# Patient Record
Sex: Female | Born: 1935 | Race: White | Hispanic: No | Smoking: Never smoker
Health system: Southern US, Community
[De-identification: ages and names within clinical notes are randomized; demographics above are authoritative.]

## PROBLEM LIST (undated history)

## (undated) DIAGNOSIS — J449 Chronic obstructive pulmonary disease, unspecified: Secondary | ICD-10-CM

## (undated) DIAGNOSIS — M810 Age-related osteoporosis without current pathological fracture: Secondary | ICD-10-CM

## (undated) DIAGNOSIS — Z974 Presence of external hearing-aid: Secondary | ICD-10-CM

## (undated) DIAGNOSIS — R42 Dizziness and giddiness: Secondary | ICD-10-CM

## (undated) DIAGNOSIS — I4891 Unspecified atrial fibrillation: Secondary | ICD-10-CM

## (undated) DIAGNOSIS — I499 Cardiac arrhythmia, unspecified: Secondary | ICD-10-CM

## (undated) DIAGNOSIS — I10 Essential (primary) hypertension: Secondary | ICD-10-CM

## (undated) DIAGNOSIS — Z86010 Personal history of colonic polyps: Secondary | ICD-10-CM

## (undated) DIAGNOSIS — R5383 Other fatigue: Secondary | ICD-10-CM

## (undated) DIAGNOSIS — Z973 Presence of spectacles and contact lenses: Secondary | ICD-10-CM

## (undated) DIAGNOSIS — E039 Hypothyroidism, unspecified: Secondary | ICD-10-CM

## (undated) DIAGNOSIS — R5381 Other malaise: Secondary | ICD-10-CM

## (undated) DIAGNOSIS — E785 Hyperlipidemia, unspecified: Secondary | ICD-10-CM

## (undated) HISTORY — DX: Age-related osteoporosis without current pathological fracture: M81.0

## (undated) HISTORY — PX: ABDOMINAL HYSTERECTOMY: SHX81

## (undated) HISTORY — DX: Hypothyroidism, unspecified: E03.9

## (undated) HISTORY — DX: Cardiac arrhythmia, unspecified: I49.9

## (undated) HISTORY — DX: Other fatigue: R53.83

## (undated) HISTORY — PX: YAG LASER APPLICATION: SHX6189

## (undated) HISTORY — PX: CHOLECYSTECTOMY: SHX55

## (undated) HISTORY — PX: BREAST SURGERY: SHX581

## (undated) HISTORY — DX: Personal history of colonic polyps: Z86.010

## (undated) HISTORY — DX: Other malaise: R53.81

## (undated) HISTORY — PX: TONSILLECTOMY: SHX5217

## (undated) HISTORY — PX: APPENDECTOMY: SHX54

## (undated) HISTORY — DX: Hyperlipidemia, unspecified: E78.5

## (undated) HISTORY — DX: Essential (primary) hypertension: I10

## (undated) HISTORY — PX: CATARACT EXTRACTION: SUR2

## (undated) HISTORY — DX: Dizziness and giddiness: R42

## (undated) HISTORY — DX: Chronic obstructive pulmonary disease, unspecified: J44.9

---

## 1998-10-22 ENCOUNTER — Emergency Department (HOSPITAL_COMMUNITY): Admission: EM | Admit: 1998-10-22 | Discharge: 1998-10-22 | Payer: Self-pay | Admitting: Emergency Medicine

## 1999-09-03 ENCOUNTER — Other Ambulatory Visit: Admission: RE | Admit: 1999-09-03 | Discharge: 1999-09-03 | Payer: Self-pay | Admitting: Obstetrics & Gynecology

## 1999-10-07 ENCOUNTER — Encounter: Payer: Self-pay | Admitting: Obstetrics & Gynecology

## 1999-10-11 ENCOUNTER — Inpatient Hospital Stay (HOSPITAL_COMMUNITY): Admission: RE | Admit: 1999-10-11 | Discharge: 1999-10-14 | Payer: Self-pay | Admitting: Obstetrics & Gynecology

## 1999-10-11 ENCOUNTER — Encounter (INDEPENDENT_AMBULATORY_CARE_PROVIDER_SITE_OTHER): Payer: Self-pay | Admitting: Specialist

## 2004-02-14 ENCOUNTER — Ambulatory Visit: Payer: Self-pay | Admitting: Internal Medicine

## 2004-05-21 ENCOUNTER — Ambulatory Visit: Payer: Self-pay | Admitting: Internal Medicine

## 2004-08-22 ENCOUNTER — Ambulatory Visit: Payer: Self-pay | Admitting: Internal Medicine

## 2004-11-20 ENCOUNTER — Ambulatory Visit: Payer: Self-pay | Admitting: Internal Medicine

## 2005-03-18 ENCOUNTER — Ambulatory Visit: Payer: Self-pay | Admitting: Internal Medicine

## 2005-07-22 ENCOUNTER — Ambulatory Visit: Payer: Self-pay | Admitting: Internal Medicine

## 2005-11-20 ENCOUNTER — Ambulatory Visit: Payer: Self-pay | Admitting: Internal Medicine

## 2006-10-09 DIAGNOSIS — M810 Age-related osteoporosis without current pathological fracture: Secondary | ICD-10-CM

## 2006-10-09 DIAGNOSIS — I10 Essential (primary) hypertension: Secondary | ICD-10-CM

## 2006-10-09 DIAGNOSIS — Z860100 Personal history of colon polyps, unspecified: Secondary | ICD-10-CM | POA: Insufficient documentation

## 2006-10-09 DIAGNOSIS — E039 Hypothyroidism, unspecified: Secondary | ICD-10-CM

## 2006-10-09 DIAGNOSIS — E785 Hyperlipidemia, unspecified: Secondary | ICD-10-CM

## 2006-10-09 HISTORY — DX: Hypothyroidism, unspecified: E03.9

## 2006-10-09 HISTORY — DX: Age-related osteoporosis without current pathological fracture: M81.0

## 2006-10-09 HISTORY — DX: Personal history of colonic polyps: Z86.010

## 2006-10-09 HISTORY — DX: Hyperlipidemia, unspecified: E78.5

## 2006-10-09 HISTORY — DX: Essential (primary) hypertension: I10

## 2006-10-09 HISTORY — DX: Personal history of colon polyps, unspecified: Z86.0100

## 2008-07-31 ENCOUNTER — Encounter: Payer: Self-pay | Admitting: Internal Medicine

## 2009-08-06 ENCOUNTER — Emergency Department (HOSPITAL_COMMUNITY): Admission: EM | Admit: 2009-08-06 | Discharge: 2009-08-06 | Payer: Self-pay | Admitting: Emergency Medicine

## 2009-08-07 ENCOUNTER — Ambulatory Visit: Payer: Self-pay | Admitting: Internal Medicine

## 2009-08-07 DIAGNOSIS — R42 Dizziness and giddiness: Secondary | ICD-10-CM | POA: Insufficient documentation

## 2009-08-07 HISTORY — DX: Dizziness and giddiness: R42

## 2009-08-07 LAB — CONVERTED CEMR LAB
ALT: 17 units/L (ref 0–35)
AST: 23 units/L (ref 0–37)
Albumin: 4 g/dL (ref 3.5–5.2)
Alkaline Phosphatase: 75 units/L (ref 39–117)
Bilirubin, Direct: 0.1 mg/dL (ref 0.0–0.3)
Cholesterol: 192 mg/dL (ref 0–200)
TSH: 1.24 microintl units/mL (ref 0.35–5.50)
Total Bilirubin: 0.5 mg/dL (ref 0.3–1.2)
Total Protein: 6.7 g/dL (ref 6.0–8.3)

## 2009-08-14 ENCOUNTER — Telehealth: Payer: Self-pay | Admitting: Internal Medicine

## 2009-08-15 ENCOUNTER — Ambulatory Visit: Payer: Self-pay | Admitting: Internal Medicine

## 2009-08-15 DIAGNOSIS — R5381 Other malaise: Secondary | ICD-10-CM

## 2009-08-15 DIAGNOSIS — R5383 Other fatigue: Secondary | ICD-10-CM

## 2009-08-15 HISTORY — DX: Other fatigue: R53.81

## 2009-08-16 ENCOUNTER — Telehealth: Payer: Self-pay | Admitting: Internal Medicine

## 2009-08-20 ENCOUNTER — Telehealth: Payer: Self-pay | Admitting: Internal Medicine

## 2009-09-12 ENCOUNTER — Telehealth: Payer: Self-pay | Admitting: Internal Medicine

## 2009-09-12 ENCOUNTER — Ambulatory Visit: Payer: Self-pay | Admitting: Internal Medicine

## 2009-09-13 ENCOUNTER — Telehealth: Payer: Self-pay | Admitting: Internal Medicine

## 2009-10-10 ENCOUNTER — Encounter: Payer: Self-pay | Admitting: Internal Medicine

## 2009-12-25 ENCOUNTER — Encounter: Payer: Self-pay | Admitting: Internal Medicine

## 2010-01-03 ENCOUNTER — Encounter: Payer: Self-pay | Admitting: Internal Medicine

## 2010-01-08 ENCOUNTER — Encounter: Payer: Self-pay | Admitting: Internal Medicine

## 2010-01-08 ENCOUNTER — Ambulatory Visit: Payer: Self-pay | Admitting: Internal Medicine

## 2010-01-08 DIAGNOSIS — I499 Cardiac arrhythmia, unspecified: Secondary | ICD-10-CM | POA: Insufficient documentation

## 2010-01-08 HISTORY — DX: Cardiac arrhythmia, unspecified: I49.9

## 2010-01-31 ENCOUNTER — Telehealth: Payer: Self-pay | Admitting: Internal Medicine

## 2010-04-16 NOTE — Letter (Signed)
Summary: [facility]   Imported By: Maryln Gottron 01/09/2010 09:18:49  _____________________________________________________________________  External Attachment:    Type:   Image     Comment:   External Document

## 2010-04-16 NOTE — Procedures (Signed)
Summary: Cardiac Event Monitor Report/[facility]  Cardiac Event Monitor Report/[facility]   Imported By: Maryln Gottron 01/09/2010 09:16:43  _____________________________________________________________________  External Attachment:    Type:   Image     Comment:   External Document

## 2010-04-16 NOTE — Miscellaneous (Signed)
Summary: med update  Clinical Lists Changes  Medications: Changed medication from SYNTHROID 75 MCG TABS (LEVOTHYROXINE SODIUM) to SYNTHROID 75 MCG TABS (LEVOTHYROXINE SODIUM) qd

## 2010-04-16 NOTE — Assessment & Plan Note (Signed)
Summary: 6 MONTH ROV/NJR   Vital Signs:  Patient profile:   77 year old female Height:      60.25 inches Weight:      144 pounds BMI:     27.99 Temp:     98.5 degrees F oral Pulse rate:   72 / minute Resp:     14 per minute BP sitting:   130 / 80  (left arm)  Vitals Entered By: Willy Eddy, LPN (January 08, 2010 10:10 AM) CC: roa Is Patient Diabetic? No   CC:  roa.  History of Present Illness: 8 -year-old patient who is seen today for followup.  She has a history of hypertension, dyslipidemia, hypothyroidism.  She is doing well clinically.  In July.  She had cardiology evaluation in [facility] due to palpitations and dizziness.  This apparently included a 3-week event monitor that was unremarkable.  Today she has done well. her blood pressure has a well-controlled on losartan  and diuretic therapy.  She remains on Lipitor, which she continues to tolerate well  Preventive Screening-Counseling & Management  Alcohol-Tobacco     Smoking Status: never  Current Problems (verified): 1)  Lassitude  (ICD-780.79) 2)  Vertigo  (ICD-780.4) 3)  Osteoporosis  (ICD-733.00) 4)  Hypothyroidism  (ICD-244.9) 5)  Hyperlipidemia  (ICD-272.4) 6)  Colonic Polyps, Hx of  (ICD-V12.72) 7)  Hypertension  (ICD-401.9)  Current Medications (verified): 1)  Synthroid 75 Mcg Tabs (Levothyroxine Sodium) .... Qd 2)  Lipitor 20 Mg Tabs (Atorvastatin Calcium) .... Qd 3)  Zolpidem Tartrate 10 Mg Tabs (Zolpidem Tartrate) .... At Bedtime Prn 4)  Meclizine Hcl 25 Mg Tabs (Meclizine Hcl) .... Q6hrs As Needed Dizziness 5)  Losartan Potassium-Hctz 100-12.5 Mg Tabs (Losartan Potassium-Hctz) .... One Daily 6)  Transderm-Scop 1.5 Mg Pt72 (Scopolamine Base) .... Place 1 Patch Behind Ear Q72hrs As Needed For Motion Sickness  Allergies (verified): 1)  ! Codeine  Past History:  Past Medical History: Reviewed history from 10/09/2006 and no changes required. Hypertension Hypercholesterolemia Colonic  polyps, hx of Hyperlipidemia Hypothyroidism Osteoporosis  Past Surgical History: Reviewed history from 08/07/2009 and no changes required. Appendectomy Cholecystectomy Hysterectomy Lumpectomy-left Tonsillectomy cataracts 5-10  Review of Systems  The patient denies anorexia, fever, weight loss, weight gain, vision loss, decreased hearing, hoarseness, chest pain, syncope, dyspnea on exertion, peripheral edema, prolonged cough, headaches, hemoptysis, abdominal pain, melena, hematochezia, severe indigestion/heartburn, hematuria, incontinence, genital sores, muscle weakness, suspicious skin lesions, transient blindness, difficulty walking, depression, unusual weight change, abnormal bleeding, enlarged lymph nodes, angioedema, and breast masses.         Flu Vaccine Consent Questions     Do you have a history of severe allergic reactions to this vaccine? no    Any prior history of allergic reactions to egg and/or gelatin? no    Do you have a sensitivity to the preservative Thimersol? no    Do you have a past history of Guillan-Barre Syndrome? no    Do you currently have an acute febrile illness? no    Have you ever had a severe reaction to latex? no    Vaccine information given and explained to patient? yes    Are you currently pregnant? no    Lot Number:[id number]   Exp Date:09/14/2010   Site Given  Left Deltoid IM   Physical Exam  General:  elderly alert, no distress.  Blood pressure 120/80 Head:  Normocephalic and atraumatic without obvious abnormalities. No apparent alopecia or balding. Eyes:  No corneal or conjunctival inflammation  noted. EOMI. Perrla. Funduscopic exam benign, without hemorrhages, exudates or papilledema. Vision grossly normal. Ears:  External ear exam shows no significant lesions or deformities.  Otoscopic examination reveals clear canals, tympanic membranes are intact bilaterally without bulging, retraction, inflammation or discharge. Hearing is grossly normal  bilaterally. Mouth:  Oral mucosa and oropharynx without lesions or exudates.  Teeth in good repair. Neck:  No deformities, masses, or tenderness noted. Lungs:  Normal respiratory effort, chest expands symmetrically. Lungs are clear to auscultation, no crackles or wheezes. Heart:  irregular with a controlled ventricular response Abdomen:  Bowel sounds positive,abdomen soft and non-tender without masses, organomegaly or hernias noted. Msk:  No deformity or scoliosis noted of thoracic or lumbar spine.   Pulses:  R and L carotid,radial,femoral,dorsalis pedis and posterior tibial pulses are full and equal bilaterally Extremities:  No clubbing, cyanosis, edema, or deformity noted with normal full range of motion of all joints.   Skin:  Intact without suspicious lesions or rashes Cervical Nodes:  No lymphadenopathy noted   Impression & Recommendations:  Problem # 1:  HYPERLIPIDEMIA (ICD-272.4)  Her updated medication list for this problem includes:    Lipitor 20 Mg Tabs (Atorvastatin calcium) ..... Qd  Her updated medication list for this problem includes:    Lipitor 20 Mg Tabs (Atorvastatin calcium) ..... Qd  Problem # 2:  HYPERTENSION (ICD-401.9)  Her updated medication list for this problem includes:    Losartan Potassium-hctz 100-12.5 Mg Tabs (Losartan potassium-hctz) ..... One daily  Her updated medication list for this problem includes:    Losartan Potassium-hctz 100-12.5 Mg Tabs (Losartan potassium-hctz) ..... One daily  Problem # 3:  HYPOTHYROIDISM (ICD-244.9)  Her updated medication list for this problem includes:    Synthroid 75 Mcg Tabs (Levothyroxine sodium) ..... Qd  Her updated medication list for this problem includes:    Synthroid 75 Mcg Tabs (Levothyroxine sodium) ..... Qd  Problem # 4:  CARDIAC ARRHYTHMIA (ICD-427.9) will check a 12-lead EKG to rule out atrial fibrillation  Complete Medication List: 1)  Synthroid 75 Mcg Tabs (Levothyroxine sodium) .... Qd 2)   Lipitor 20 Mg Tabs (Atorvastatin calcium) .... Qd 3)  Zolpidem Tartrate 10 Mg Tabs (Zolpidem tartrate) .... At bedtime prn 4)  Meclizine Hcl 25 Mg Tabs (Meclizine hcl) .... Q6hrs as needed dizziness 5)  Losartan Potassium-hctz 100-12.5 Mg Tabs (Losartan potassium-hctz) .... One daily 6)  Transderm-scop 1.5 Mg Pt72 (Scopolamine base) .... Place 1 patch behind ear q72hrs as needed for motion sickness  Other Orders: Flu Vaccine 86yrs + MEDICARE PATIENTS (H4742) Administration Flu vaccine - MCR (V9563)  Patient Instructions: 1)  Please schedule a follow-up appointment in 6 months. 2)  Limit your Sodium (Salt). 3)  It is important that you exercise regularly at least 20 minutes 5 times a week. If you develop chest pain, have severe difficulty breathing, or feel very tired , stop exercising immediately and seek medical attention. Prescriptions: LOSARTAN POTASSIUM-HCTZ 100-12.5 MG TABS (LOSARTAN POTASSIUM-HCTZ) one daily  #90 x 4   Entered and Authorized by:   Gordy Savers  MD   Signed by:   Gordy Savers  MD on 01/08/2010   Method used:   Electronically to        [facility] (retail)       [address]       [place], [place]  [address]       Ph: [phone]  Fax: [phone]   RxID:   [id number] LIPITOR 20 MG TABS (ATORVASTATIN CALCIUM) qd  #90 x 6   Entered and Authorized by:   Gordy Savers  MD   Signed by:   Gordy Savers  MD on 01/08/2010   Method used:   Electronically to        [facility].* (retail)       [address]       [place], [place]  [address]       Ph: [phone]       Fax: [phone]   RxID:   [id number] SYNTHROID 75 MCG TABS (LEVOTHYROXINE SODIUM) qd  #90 x 4   Entered and Authorized by:   Gordy Savers  MD   Signed by:   Gordy Savers  MD on 01/08/2010   Method used:   Electronically to        [facility] (retail)        [address]       [place], [place]  [address]       Ph: [phone]       Fax: [phone]   RxID:   [id number]    Orders Added: 1)  Flu Vaccine 63yrs + MEDICARE PATIENTS [Q2039] 2)  Administration Flu vaccine - MCR [G0008] 3)  Est. Patient Level IV [25427]  Appended Document: Orders Update    Clinical Lists Changes  Orders: Added new Service order of EKG w/ Interpretation (93000) - Signed

## 2010-04-16 NOTE — Progress Notes (Signed)
Summary: scopolamine patch  Phone Note Call from Patient Call back at Home Phone [phone]   Caller: Patient Call For: Gordy Savers  MD Summary of Call: pt is requesting to talk to kim concerning scopolamine patch pharm [facility] [phone] Initial call taken by: Heron Sabins,  September 13, 2009 10:05 AM  Follow-up for Phone Call        to Dr. Amador Cunas to advise. KIK Follow-up by: Duard Brady LPN,  September 13, 2009 10:10 AM  Additional Follow-up for Phone Call Additional follow up Details #1::        OK  #3  use q72h as needed motion sickness Additional Follow-up by: Gordy Savers  MD,  September 13, 2009 12:55 PM    New/Updated Medications: TRANSDERM-SCOP 1.5 MG PT72 (SCOPOLAMINE BASE) place 1 patch behind ear q72hrs as needed for motion sickness Prescriptions: TRANSDERM-SCOP 1.5 MG PT72 (SCOPOLAMINE BASE) place 1 patch behind ear q72hrs as needed for motion sickness  #3 x 0   Entered by:   Duard Brady LPN   Authorized by:   Gordy Savers  MD   Signed by:   Duard Brady LPN on 62/13/0865   Method used:   Electronically to        [facility].* (retail)       [address]       [place], [place]  [address]       Ph: [phone]       Fax: [phone]   RxID:   [id number]

## 2010-04-16 NOTE — Assessment & Plan Note (Signed)
Summary: 1 month rov/njr   Vital Signs:  Patient profile:   77 year old female Weight:      144 pounds Temp:     98.1 degrees F oral BP sitting:   138 / 82  (right arm) Cuff size:   regular  Vitals Entered By: Duard Brady LPN (September 12, 2009 10:55 AM) CC: 1 mos rov - doing well Is Patient Diabetic? No   CC:  1 mos rov - doing well.  History of Present Illness: an 77 year old patient has a history of episodic vertigo and treated hypertension.  One month ago for beta-blocker therapy was tapered due to chronic fatigue.  She has not noted much of a difference with tapering this medication.  She is only on half her prior dose every other day.  losartan was also added to her regimen  Allergies: 1)  ! Codeine  Past History:  Past Medical History: Reviewed history from 10/09/2006 and no changes required. Hypertension Hypercholesterolemia Colonic polyps, hx of Hyperlipidemia Hypothyroidism Osteoporosis  Review of Systems  The patient denies anorexia, fever, weight loss, weight gain, vision loss, decreased hearing, hoarseness, chest pain, syncope, dyspnea on exertion, peripheral edema, prolonged cough, headaches, hemoptysis, abdominal pain, melena, hematochezia, severe indigestion/heartburn, hematuria, incontinence, genital sores, muscle weakness, suspicious skin lesions, transient blindness, difficulty walking, depression, unusual weight change, abnormal bleeding, enlarged lymph nodes, angioedema, and breast masses.    Physical Exam  General:  overweight-appearing.  overweight-appearing.   Head:  Normocephalic and atraumatic without obvious abnormalities. No apparent alopecia or balding. Mouth:  Oral mucosa and oropharynx without lesions or exudates.  Teeth in good repair. Neck:  No deformities, masses, or tenderness noted. Lungs:  Normal respiratory effort, chest expands symmetrically. Lungs are clear to auscultation, no crackles or wheezes. Heart:  Normal rate and regular  rhythm. S1 and S2 normal without gallop, murmur, click, rub or other extra sounds.   Impression & Recommendations:  Problem # 1:  LASSITUDE (ICD-780.79)  Problem # 2:  HYPERTENSION (ICD-401.9)  The following medications were removed from the medication list:    Losartan Potassium 100 Mg Tabs (Losartan potassium) ..... One daily    Bisoprolol-hydrochlorothiazide 5-6.25 Mg Tabs (Bisoprolol-hydrochlorothiazide) ..... One daily for two weeks, then every other day for two weeks, then discontinue Her updated medication list for this problem includes:    Losartan Potassium-hctz 100-12.5 Mg Tabs (Losartan potassium-hctz) ..... One daily  The following medications were removed from the medication list:    Losartan Potassium 100 Mg Tabs (Losartan potassium) ..... One daily    Bisoprolol-hydrochlorothiazide 5-6.25 Mg Tabs (Bisoprolol-hydrochlorothiazide) ..... One daily for two weeks, then every other day for two weeks, then discontinue Her updated medication list for this problem includes:    Losartan Potassium-hctz 100-12.5 Mg Tabs (Losartan potassium-hctz) ..... One daily  Complete Medication List: 1)  Synthroid 75 Mcg Tabs (Levothyroxine sodium) .... Qd 2)  Lipitor 20 Mg Tabs (Atorvastatin calcium) .... Qd 3)  Zolpidem Tartrate 10 Mg Tabs (Zolpidem tartrate) .... At bedtime prn 4)  Meclizine Hcl 25 Mg Tabs (Meclizine hcl) .... Q6hrs as needed dizziness 5)  Losartan Potassium-hctz 100-12.5 Mg Tabs (Losartan potassium-hctz) .... One daily  Patient Instructions: 1)  Limit your Sodium (Salt). 2)  It is important that you exercise regularly at least 20 minutes 5 times a week. If you develop chest pain, have severe difficulty breathing, or feel very tired , stop exercising immediately and seek medical attention. 3)  Check your Blood Pressure regularly. If it is above: 150/90  you should make an appointment. 4)  Please schedule a follow-up appointment in 4 months. Prescriptions: LOSARTAN  POTASSIUM-HCTZ 100-12.5 MG TABS (LOSARTAN POTASSIUM-HCTZ) one daily  #90 x 4   Entered and Authorized by:   Gordy Savers  MD   Signed by:   Gordy Savers  MD on 09/12/2009   Method used:   Electronically to        [facility].* (retail)       [address]       [place], [place]  [address]       Ph: [phone]       Fax: [phone]   RxID:   [id number]

## 2010-04-16 NOTE — Assessment & Plan Note (Signed)
Summary: FATIGUE / WEAKNESS // SR   Vital Signs:  Patient profile:   77 year old female Weight:      144 pounds Temp:     98.0 degrees F oral BP sitting:   120 / 76  (left arm) Cuff size:   regular  Vitals Entered By: Duard Brady LPN (August 15, 4780 10:28 AM) [facility]: c/o weakness and lower abd pain , f/u on last wks visit Is Patient Diabetic? No   CC:  c/o weakness and lower abd pain  and f/u on last wks visit.  History of Present Illness: 77 -year-old patient who is seen today for follow-up; she was a valuated in the ED  recently for vertigo, and laboratory studies were unremarkable.  Her main complaint is fatigue of about 6 months duration.  Antihypertensive regimen includes beta-blocker therapy.  No focal complaints  Preventive Screening-Counseling & Management  Alcohol-Tobacco     Smoking Status: never  Allergies: 1)  ! Codeine  Physical Exam  General:  Well-developed,well-nourished,in no acute distress; alert,appropriate and cooperative throughout examination; blood pressure low normal range Head:  Normocephalic and atraumatic without obvious abnormalities. No apparent alopecia or balding. Mouth:  Oral mucosa and oropharynx without lesions or exudates.  Teeth in good repair. Neck:  No deformities, masses, or tenderness noted. Lungs:  Normal respiratory effort, chest expands symmetrically. Lungs are clear to auscultation, no crackles or wheezes. Heart:  Normal rate and regular rhythm. S1 and S2 normal without gallop, murmur, click, rub or other extra sounds. Abdomen:  Bowel sounds positive,abdomen soft and non-tender without masses, organomegaly or hernias noted. Msk:  No deformity or scoliosis noted of thoracic or lumbar spine.     Impression & Recommendations:  Problem # 1:  HYPERTENSION (ICD-401.9)  The following medications were removed from the medication list:    Bisoprolol-hydrochlorothiazide 10-6.25 Mg Tabs (Bisoprolol-hydrochlorothiazide) ..... One  daily Her updated medication list for this problem includes:    Losartan Potassium 100 Mg Tabs (Losartan potassium) ..... One daily    Bisoprolol-hydrochlorothiazide 5-6.25 Mg Tabs (Bisoprolol-hydrochlorothiazide) ..... One daily for two weeks, then every other day for two weeks, then discontinue  The following medications were removed from the medication list:    Bisoprolol-hydrochlorothiazide 10-6.25 Mg Tabs (Bisoprolol-hydrochlorothiazide) ..... One daily Her updated medication list for this problem includes:    Losartan Potassium 100 Mg Tabs (Losartan potassium) ..... One daily    Bisoprolol-hydrochlorothiazide 5-6.25 Mg Tabs (Bisoprolol-hydrochlorothiazide) ..... One daily for two weeks, then every other day for two weeks, then discontinue  Problem # 2:  LASSITUDE (ICD-780.79)  Orders: Sedimentation Rate, non-automated ([address])  Complete Medication List: 1)  Synthroid 75 Mcg Tabs (Levothyroxine sodium) .... Qd 2)  Lipitor 20 Mg Tabs (Atorvastatin calcium) .... Qd 3)  Losartan Potassium 100 Mg Tabs (Losartan potassium) .... One daily 4)  Zolpidem Tartrate 10 Mg Tabs (Zolpidem tartrate) .... At bedtime prn 5)  Bisoprolol-hydrochlorothiazide 5-6.25 Mg Tabs (Bisoprolol-hydrochlorothiazide) .... One daily for two weeks, then every other day for two weeks, then discontinue  Patient Instructions: 1)  Please schedule a follow-up appointment in 1 month. 2)  Limit your Sodium (Salt) to less than 2 grams a day(slightly less than 1/2 a teaspoon) to prevent fluid retention, swelling, or worsening of symptoms. Prescriptions: BISOPROLOL-HYDROCHLOROTHIAZIDE 5-6.25 MG TABS (BISOPROLOL-HYDROCHLOROTHIAZIDE) one daily for two weeks, then every other day for two weeks, then discontinue  #30 x 0   Entered and Authorized by:   Gordy Savers  MD   Signed by:  Gordy Savers  MD on 08/15/2009   Method used:   Print then Give to Patient   RxID:   [id number]   Laboratory Results    Blood Tests     SED rate: 20 mm/hr  Comments: Rita Ohara  August 15, 2009 12:00 PM

## 2010-04-16 NOTE — Progress Notes (Signed)
Summary: wanting results and rx called out  Phone Note Call from Patient   Caller: Daughter ? Natalia Leatherwood Call For: Gordy Savers  MD Summary of Call: calling for results  can call back at [phone] Natalia Leatherwood) Initial call taken by: Duard Brady LPN,  August 21, 4538 2:07 PM  Follow-up for Phone Call        Daughter is calling back again for her Mom's results. Follow-up by: Lynann Beaver CMA,  August 20, 2009 4:33 PM  Additional Follow-up for Phone Call Additional follow up Details #1::        please  notify the laboratory studies were all normal Additional Follow-up by: Gordy Savers  MD,  August 20, 2009 5:36 PM    Additional Follow-up for Phone Call Additional follow up Details #2::    called daughter - cathy - informed of labs WNL, still having some vertigo - is there anything we can rx ? [facility] in [place] Follow-up by: Duard Brady LPN,  August 21, 9809 8:25 AM  Additional Follow-up for Phone Call Additional follow up Details #3:: Details for Additional Follow-up Action Taken: meclizine 25 mg, number 50, 1 every 6 hours as needed for dizziness    called daughter to inform of [facility]. KIK Additional Follow-up by: Gordy Savers  MD,  August 21, 2009 9:06 AM  New/Updated Medications: MECLIZINE HCL 25 MG TABS (MECLIZINE HCL) q6hrs as needed dizziness Prescriptions: MECLIZINE HCL 25 MG TABS (MECLIZINE HCL) q6hrs as needed dizziness  #50 x 0   Entered by:   Duard Brady LPN   Authorized by:   Gordy Savers  MD   Signed by:   Duard Brady LPN on 91/47/8295   Method used:   [facility]ed to ...       [facility] [address] (retail)       [address]       [place], [place]  [address]       Ph: [phone]       Fax: [phone]   RxID:   [id number]

## 2010-04-16 NOTE — Progress Notes (Signed)
Summary: refill zolpidem  Phone Note Refill Request Message from:  Fax from Pharmacy on August 16, 2009 2:06 PM  Refills Requested: Medication #1:  ZOLPIDEM TARTRATE 10 MG TABS at bedtime prn [facility] [place]    [phone]   Method Requested: Telephone to Pharmacy Initial call taken by: Duard Brady LPN,  August 17, 6211 2:07 PM    Prescriptions: ZOLPIDEM TARTRATE 10 MG TABS (ZOLPIDEM TARTRATE) at bedtime prn  #30 x 2   Entered by:   Duard Brady LPN   Authorized by:   Gordy Savers  MD   Signed by:   Duard Brady LPN on 08/65/7846   Method used:   Historical   RxID:   [id number]

## 2010-04-16 NOTE — Letter (Signed)
Summary: [facility] [place]   Imported By: Maryln Gottron 10/16/2009 10:05:50  _____________________________________________________________________  External Attachment:    Type:   Image     Comment:   External Document

## 2010-04-16 NOTE — Progress Notes (Signed)
Summary: call  Phone Note Call from Patient   Caller: Christine Perez,daughter [phone] Summary of Call: Test results & no better.  Extremely weak.   Uneasy.  Wants a call about her tests.  Rudy Jew, RN  Aug 14, 2009 3:27 PM Called lab results to daughter, all okay.  Rudy Jew, RN  Aug 14, 2009 4:59 PM   Initial call taken by: Rudy Jew, RN,  Aug 14, 2009 11:29 AM  Follow-up for Phone Call        rov tomarrow Follow-up by: Gordy Savers  MD,  Aug 14, 2009 12:58 PM  Additional Follow-up for Phone Call Additional follow up Details #1::        Left message to schedule ov tomorrow.  Rudy Jew, RN  Aug 14, 2009 1:55 PM     Additional Follow-up for Phone Call Additional follow up Details #2::    Pts dghtr called and scheduled appt for tomorrow, 08/15/2009 at 10:15am. Follow-up by: Debbra Riding,  Aug 14, 2009 2:21 PM

## 2010-04-16 NOTE — Progress Notes (Signed)
Summary: URI  Phone Note Call from Patient   Caller: Patient Call For: Gordy Savers  MD Reason for Call: Acute Illness, Refill Medication Action Taken: Provider Notified Summary of Call: Pt started with URI and sore throat x one day with no fever or cough.  Takng Advil. Nicolette Bang [facility] Call when ready Initial call taken by: [facility] CMA AAMA,  January 31, 2010 10:40 AM  Follow-up for Phone Call        add mucinex dm Follow-up by: Gordy Savers  MD,  January 31, 2010 12:43 PM  Additional Follow-up for Phone Call Additional follow up Details #1::        Notified pt. Additional Follow-up by: Lynann Beaver CMA AAMA,  January 31, 2010 12:46 PM

## 2010-04-16 NOTE — Assessment & Plan Note (Signed)
Summary: PT RE-EST / POST ED EVAL / HTN CONCERNS - OK PER DR K // RS   Vital Signs:  Patient profile:   77 year old female Height:      60.25 inches Weight:      143 pounds BMI:     27.80 Temp:     98.3 degrees F oral BP sitting:   122 / 70  (left arm) Cuff size:   regular  Vitals Entered By: Duard Brady LPN (Aug 07, 2009 10:50 AM) CC: f/u on [facility] for elevated BP  Is Patient Diabetic? No   CC:  f/u on [facility] for elevated BP .  History of Present Illness:  77 year old patient who is seen today to reestablish with our practice.  She was seen at the merger last night due to accelerated hypertension and acute vertigo.  Vertigo has not recurred, but she has never had a prior episode.  A head CT revealed small vessel disease only.  She has osteoporosis, hypothyroidism, history of colonic polyps and hypertension.  no new concerns or complaints  Preventive Screening-Counseling & Management  Alcohol-Tobacco     Smoking Status: never  Allergies: 1)  ! Codeine  Past History:  Past Medical History: Reviewed history from 10/09/2006 and no changes required. Hypertension Hypercholesterolemia Colonic polyps, hx of Hyperlipidemia Hypothyroidism Osteoporosis  Past Surgical History: Appendectomy Cholecystectomy Hysterectomy Lumpectomy-left Tonsillectomy cataracts 5-10  Family History: Reviewed history from 10/09/2006 and no changes required. Family History Other cancer-Colon, breast Fam hxMI  Social History: Reviewed history from 10/09/2006 and no changes required. Married Alcohol use-no NonsmokerSmoking Status:  never  Review of Systems  The patient denies anorexia, fever, weight loss, weight gain, vision loss, decreased hearing, hoarseness, chest pain, syncope, dyspnea on exertion, peripheral edema, prolonged cough, headaches, hemoptysis, abdominal pain, melena, severe indigestion/heartburn, hematuria, incontinence, genital sores, muscle  weakness, suspicious skin lesions, transient blindness, difficulty walking, depression, unusual weight change, abnormal bleeding, enlarged lymph nodes, angioedema, and breast masses.    Physical Exam  General:  elderly alert, no distress.  Blood pressure 135/80 Head:  Normocephalic and atraumatic without obvious abnormalities. No apparent alopecia or balding. Eyes:  No corneal or conjunctival inflammation noted. EOMI. Perrla. Funduscopic exam benign, without hemorrhages, exudates or papilledema. Vision grossly normal. Ears:  External ear exam shows no significant lesions or deformities.  Otoscopic examination reveals clear canals, tympanic membranes are intact bilaterally without bulging, retraction, inflammation or discharge. Hearing is grossly normal bilaterally. Mouth:  Oral mucosa and oropharynx without lesions or exudates.  Teeth in good repair. Neck:  No deformities, masses, or tenderness noted. Chest Wall:  No deformities, masses, or tenderness noted. Breasts:  No mass, nodules, thickening, tenderness, bulging, retraction, inflamation, nipple discharge or skin changes noted.   Lungs:  Normal respiratory effort, chest expands symmetrically. Lungs are clear to auscultation, no crackles or wheezes. Heart:  Normal rate and regular rhythm. S1 and S2 normal without gallop, murmur, click, rub or other extra sounds. Abdomen:  Bowel sounds positive,abdomen soft and non-tender without masses, organomegaly or hernias noted. Msk:  No deformity or scoliosis noted of thoracic or lumbar spine.   Pulses:  R and L carotid,radial,femoral,dorsalis pedis and posterior tibial pulses are full and equal bilaterally Extremities:  No clubbing, cyanosis, edema, or deformity noted with normal full range of motion of all joints.   Neurologic:  cranial nerves II-XII intact, gait normal, DTRs symmetrical and normal, and finger-to-nose normal.  cranial nerves II-XII intact, gait normal, DTRs symmetrical and  normal, and  finger-to-nose normal.   Skin:  Intact without suspicious lesions or rashes Cervical Nodes:  No lymphadenopathy noted Axillary Nodes:  No palpable lymphadenopathy Psych:  Cognition and judgment appear intact. Alert and cooperative with normal attention span and concentration. No apparent delusions, illusions, hallucinations   Impression & Recommendations:  Problem # 1:  VERTIGO (ICD-780.4)  Problem # 2:  HYPOTHYROIDISM (ICD-244.9)  Her updated medication list for this problem includes:    Synthroid 75 Mcg Tabs (Levothyroxine sodium)  Orders: Venipuncture (88416) TLB-TSH (Thyroid Stimulating Hormone) (60630-ZSW) Prescription Created Electronically [phone])  Problem # 3:  HYPERTENSION (ICD-401.9)  The following medications were removed from the medication list:    Cozaar 100 Mg Tabs (Losartan potassium)    Ziac 10-6.25 Mg Tabs (Bisoprolol-hydrochlorothiazide) Her updated medication list for this problem includes:    Losartan Potassium 100 Mg Tabs (Losartan potassium) ..... One daily    Bisoprolol-hydrochlorothiazide 10-6.25 Mg Tabs (Bisoprolol-hydrochlorothiazide) ..... One daily  Orders: Venipuncture (35573) Prescription Created Electronically [phone])  Problem # 4:  HYPERLIPIDEMIA (ICD-272.4)  Her updated medication list for this problem includes:    Lipitor 20 Mg Tabs (Atorvastatin calcium) ..... Qd  Orders: TLB-Hepatic/Liver Function Pnl (80076-HEPATIC) TLB-Cholesterol, Total (82465-CHO) Prescription Created Electronically [phone])  Complete Medication List: 1)  Synthroid 75 Mcg Tabs (Levothyroxine sodium) 2)  Lipitor 20 Mg Tabs (Atorvastatin calcium) .... Qd 3)  Losartan Potassium 100 Mg Tabs (Losartan potassium) .... One daily 4)  Bisoprolol-hydrochlorothiazide 10-6.25 Mg Tabs (Bisoprolol-hydrochlorothiazide) .... One daily  Patient Instructions: 1)  Please schedule a follow-up appointment in 6 months. 2)  Limit your Sodium (Salt) to less than 2 grams a  day(slightly less than 1/2 a teaspoon) to prevent fluid retention, swelling, or worsening of symptoms. 3)  It is important that you exercise regularly at least 20 minutes 5 times a week. If you develop chest pain, have severe difficulty breathing, or feel very tired , stop exercising immediately and seek medical attention. 4)  Check your Blood Pressure regularly. If it is above:  150/90 you should make an appointment. Prescriptions: BISOPROLOL-HYDROCHLOROTHIAZIDE 10-6.25 MG TABS (BISOPROLOL-HYDROCHLOROTHIAZIDE) one daily  #90 x 6   Entered and Authorized by:   Gordy Savers  MD   Signed by:   Gordy Savers  MD on 08/07/2009   Method used:   Print then Give to Patient   RxID:   [id number] LOSARTAN POTASSIUM 100 MG TABS (LOSARTAN POTASSIUM) one daily  #90 x 6   Entered and Authorized by:   Gordy Savers  MD   Signed by:   Gordy Savers  MD on 08/07/2009   Method used:   Print then Give to Patient   RxID:   [id number] LIPITOR 20 MG TABS (ATORVASTATIN CALCIUM) qd  #90 x 6   Entered and Authorized by:   Gordy Savers  MD   Signed by:   Gordy Savers  MD on 08/07/2009   Method used:   Print then Give to Patient   RxID:   [id number] BISOPROLOL-HYDROCHLOROTHIAZIDE 10-6.25 MG TABS (BISOPROLOL-HYDROCHLOROTHIAZIDE) one daily  #90 x 6   Entered and Authorized by:   Gordy Savers  MD   Signed by:   Gordy Savers  MD on 08/07/2009   Method used:   Electronically to        [facility] (retail)       [address]       [place]  [place], [place]  [address]       Ph: [phone]       Fax: [phone]   RxID:   [id number] LOSARTAN POTASSIUM 100 MG TABS (LOSARTAN POTASSIUM) one daily  #90 x 6   Entered and Authorized by:   Gordy Savers  MD   Signed by:   Gordy Savers  MD on 08/07/2009   Method used:   Electronically to        [facility] (retail)       [address]       [place], [place]  [address]       Ph: [phone]       Fax: [phone]   RxID:   [id number] LIPITOR 20 MG TABS (ATORVASTATIN CALCIUM) qd  #90 x 6   Entered and Authorized by:   Gordy Savers  MD   Signed by:   Gordy Savers  MD on 08/07/2009   Method used:   Electronically to        [facility] (retail)       [address]       [place], [place]  [address]       Ph: [phone]       Fax: [phone]   RxID:   [id number] SYNTHROID 75 MCG TABS (LEVOTHYROXINE SODIUM)   #90 x 6   Entered and Authorized by:   Gordy Savers  MD   Signed by:   Gordy Savers  MD on 08/07/2009   Method used:   Print then Give to Patient   RxID:   [id number]

## 2010-04-16 NOTE — Progress Notes (Signed)
Summary: REQ FOR RX  Phone Note Call from Patient   Caller: Daughter Summary of Call: Pts daughter called to request that a Rx for Dramamine be sent to [facility] in [place]  along with pts other medications from todays OV...Marland Kitchen.. Olegario Messier (daughter) can be reached at [phone] with any questions or concerns.  Initial call taken by: Debbra Riding,  September 12, 2009 11:51 AM  Follow-up for Phone Call        spoke with daughter - wanting scopolamine patch rx -  Dr. Frederica Kuster to advise. KIK Follow-up by: Duard Brady LPN,  September 12, 2009 12:06 PM    Prescriptions: LOSARTAN POTASSIUM-HCTZ 100-12.5 MG TABS (LOSARTAN POTASSIUM-HCTZ) one daily  #90 x 4   Entered and Authorized by:   Gordy Savers  MD   Signed by:   Gordy Savers  MD on 09/12/2009   Method used:   Electronically to        [facility] (retail)       [address]       [place], [place]  [address]       Ph: [phone]       Fax: [phone]   RxID:   [id number] LIPITOR 20 MG TABS (ATORVASTATIN CALCIUM) qd  #90 x 6   Entered and Authorized by:   Gordy Savers  MD   Signed by:   Gordy Savers  MD on 09/12/2009   Method used:   Electronically to        [facility] (retail)       [address]       [place], [place]  [address]       Ph: [phone]       Fax: [phone]   RxID:   [id number] SYNTHROID 75 MCG TABS (LEVOTHYROXINE SODIUM) qd  #90 x 4   Entered and Authorized by:   Gordy Savers  MD   Signed by:   Gordy Savers  MD on 09/12/2009   Method used:   Electronically to        [facility] (retail)       [address]       [place], [place]  [address]       Ph: [phone]       Fax: [phone]   RxID:   [id number]   Appended Document: REQ FOR RX Scopolamine ?

## 2010-06-03 LAB — POCT CARDIAC MARKERS
CKMB, poc: 1.4 ng/mL (ref 1.0–8.0)
Troponin i, poc: <0.05 ng/mL (ref 0.00–0.09)

## 2010-06-03 LAB — URINALYSIS, ROUTINE W REFLEX MICROSCOPIC
Bilirubin Urine: NEGATIVE
Glucose, UA: NEGATIVE mg/dL
Hgb urine dipstick: NEGATIVE
Ketones, ur: NEGATIVE mg/dL
Nitrite: NEGATIVE
Protein, ur: NEGATIVE mg/dL
Specific Gravity, Urine: 1.019 (ref 1.005–1.030)
Urobilinogen, UA: 0.2 mg/dL (ref 0.0–1.0)
pH: 6 (ref 5.0–8.0)

## 2010-06-03 LAB — POCT I-STAT, CHEM 8
Chloride: 105 mEq/L (ref 96–112)
Glucose, Bld: 107 mg/dL — ABNORMAL HIGH (ref 70–99)
HCT: 42 % (ref 36.0–46.0)
Potassium: 4.1 mEq/L (ref 3.5–5.1)

## 2010-06-03 LAB — DIFFERENTIAL
Basophils Absolute: 0 10*3/uL (ref 0.0–0.1)
Basophils Relative: 1 % (ref 0–1)
Eosinophils Absolute: 0.1 10*3/uL (ref 0.0–0.7)
Eosinophils Relative: 1 % (ref 0–5)
Lymphocytes Relative: 31 % (ref 12–46)
Lymphs Abs: 1.3 10*3/uL (ref 0.7–4.0)
Monocytes Absolute: 0.4 10*3/uL (ref 0.1–1.0)
Monocytes Relative: 9 % (ref 3–12)
Neutro Abs: 2.4 10*3/uL (ref 1.7–7.7)
Neutrophils Relative %: 57 % (ref 43–77)

## 2010-06-03 LAB — URINE MICROSCOPIC-ADD ON

## 2010-06-03 LAB — CBC
HCT: 39.5 % (ref 36.0–46.0)
Hemoglobin: 13 g/dL (ref 12.0–15.0)
MCHC: 32.8 g/dL (ref 30.0–36.0)
MCV: 93.4 fL (ref 78.0–100.0)
Platelets: 150 10*3/uL (ref 150–400)
RBC: 4.23 MIL/uL (ref 3.87–5.11)
RDW: 14.4 % (ref 11.5–15.5)
WBC: 4.2 10*3/uL (ref 4.0–10.5)

## 2010-06-07 ENCOUNTER — Encounter: Payer: Self-pay | Admitting: Internal Medicine

## 2010-07-05 ENCOUNTER — Encounter: Payer: Self-pay | Admitting: Internal Medicine

## 2010-07-10 ENCOUNTER — Ambulatory Visit (INDEPENDENT_AMBULATORY_CARE_PROVIDER_SITE_OTHER): Payer: Medicare Other | Admitting: Internal Medicine

## 2010-07-10 ENCOUNTER — Encounter: Payer: Self-pay | Admitting: Internal Medicine

## 2010-07-10 VITALS — BP 132/80 | HR 80 | Temp 98.1°F | Resp 16 | Ht 60.5 in | Wt 154.0 lb

## 2010-07-10 DIAGNOSIS — I10 Essential (primary) hypertension: Secondary | ICD-10-CM

## 2010-07-10 DIAGNOSIS — E039 Hypothyroidism, unspecified: Secondary | ICD-10-CM

## 2010-07-10 DIAGNOSIS — Z0000 Encounter for general adult medical examination without abnormal findings: Secondary | ICD-10-CM

## 2010-07-10 DIAGNOSIS — M810 Age-related osteoporosis without current pathological fracture: Secondary | ICD-10-CM

## 2010-07-10 DIAGNOSIS — E785 Hyperlipidemia, unspecified: Secondary | ICD-10-CM

## 2010-07-10 LAB — CBC WITH DIFFERENTIAL/PLATELET
Basophils Relative: 0.3 % (ref 0.0–3.0)
Eosinophils Absolute: 0 10*3/uL (ref 0.0–0.7)
Eosinophils Relative: 0.9 % (ref 0.0–5.0)
Lymphocytes Relative: 32.6 % (ref 12.0–46.0)
Neutrophils Relative %: 56 % (ref 43.0–77.0)
RBC: 4.07 Mil/uL (ref 3.87–5.11)
WBC: 4 10*3/uL — ABNORMAL LOW (ref 4.5–10.5)

## 2010-07-10 LAB — LIPID PANEL
HDL: 60 mg/dL (ref >39.00)
Total CHOL/HDL Ratio: 3

## 2010-07-10 LAB — HEPATIC FUNCTION PANEL
ALT: 20 U/L (ref 0–35)
Alkaline Phosphatase: 80 U/L (ref 39–117)
Bilirubin, Direct: 0 mg/dL (ref 0.0–0.3)
Total Protein: 6.9 g/dL (ref 6.0–8.3)

## 2010-07-10 LAB — BASIC METABOLIC PANEL
Calcium: 10.6 mg/dL — ABNORMAL HIGH (ref 8.4–10.5)
Creatinine, Ser: 0.9 mg/dL (ref 0.4–1.2)

## 2010-07-10 LAB — TSH: TSH: 1.71 u[IU]/mL (ref 0.35–5.50)

## 2010-07-10 MED ORDER — ZOLPIDEM TARTRATE 10 MG PO TABS: 10.0000 mg | ORAL_TABLET | Freq: Every evening | ORAL | Status: DC | PRN

## 2010-07-10 MED ORDER — ATORVASTATIN CALCIUM 20 MG PO TABS: 20.0000 mg | ORAL_TABLET | Freq: Every day | ORAL | Status: DC

## 2010-07-10 MED ORDER — LEVOTHYROXINE SODIUM 75 MCG PO TABS: 75.0000 ug | ORAL_TABLET | Freq: Every day | ORAL | Status: DC

## 2010-07-10 MED ORDER — LOSARTAN POTASSIUM-HCTZ 100-12.5 MG PO TABS: 1.0000 | ORAL_TABLET | Freq: Every day | ORAL | Status: DC

## 2010-07-10 NOTE — Patient Instructions (Signed)
Limit your sodium (Salt) intake    It is important that you exercise regularly, at least 20 minutes 3 to 4 times per week.  If you develop chest pain or shortness of breath seek  medical attention.  Please check your blood pressure on a regular basis.  If it is consistently greater than 150/90, please make an office appointment.  Return in 6 months for follow-up   

## 2010-07-10 NOTE — Progress Notes (Signed)
Subjective:    Patient ID: Christine Perez, female    DOB: Jul 23, 1925, 77 y.o.   MRN: [id number]  HPI  77 year old patient who is seen today for an annual exam. Medical problems include hypothyroidism as well as dyslipidemia. She has treated hypertension. She is doing quite well. She does have a history of colonic polyps and underwent a colonoscopy in the past for approximately 2 years ago;  no major concerns or complaints today    Patient profile: 77 year old female  Height: 60.25 inches  Weight: 143 pounds  BMI: 27.80  Temp: 98.3 degrees F oral  BP sitting: 122 / 70 (left arm)  Cuff size: regular  Vitals Entered By: Duard Brady LPN (Aug 07, 2009 10:50 AM)  CC: f/u on [facility] for elevated BP  Is Patient Diabetic? No  CC: f/u on [facility] for elevated BP .  History of Present Illness:  77 year old patient who is seen today to reestablish with our practice. She was seen at the [facility] last night due to accelerated hypertension and acute vertigo. Vertigo has not recurred, but she has never had a prior episode. A head CT revealed small vessel disease only. She has osteoporosis, hypothyroidism, history of colonic polyps and hypertension. no new concerns or complaints  Preventive Screening-Counseling & Management  Alcohol-Tobacco  Smoking Status: never  Allergies:  1) ! Codeine  Past History:  Past Medical History:  Reviewed history from 10/09/2006 and no changes required.  Hypertension  Hypercholesterolemia  Colonic polyps, hx of  Hyperlipidemia  Hypothyroidism  Osteoporosis  Past Surgical History:  Appendectomy  Cholecystectomy  Hysterectomy  Lumpectomy-left  Tonsillectomy  cataracts 5-10  Family History:  Reviewed history from 10/09/2006 and no changes required.  Family History Other cancer-Colon, breast  Fam hxMI  Social History:  Reviewed history from 10/09/2006 and no changes required.  Married  Alcohol use-no  NonsmokerSmoking Status: never   1.  Risk factors, based on past  M,S,F history-cardiovascular risk factors include hypertension dyslipidemia 2.  Physical activities: Fairly sedentary but no restrictions  3.  Depression/mood: No history depression or mood disorder  4.  Hearing: No deficits  5.  ADL's: Independent in all aspects of daily living  6.  Fall risk: Low to moderate due to a and mild obesity  7.  Home safety: No problems identified. Lives with her daughter  6.  Height weight, and visual acuity; height and weight stable does have a history of osteoporosis and kyphosis. Recently passed her driver's license exam with no restrictions with glasses she has a cataract extraction surgery in the past  9.  Counseling: Heart healthy diet modest weight loss and more regular exercise all encouraged  10. Lab orders based on risk factors: Laboratory profile including TSH and lipid panel will be reviewed  11. Referral: Not appropriate at this time. Has had a recent mammogram  12. Care plan: Restricted salt diet exercise weight loss encouraged  13. Cognitive assessment: Alert and appropriate with normal affect. No cognitive dysfunction      Review of Systems  Constitutional: Negative for fever, appetite change, fatigue and unexpected weight change.  HENT: Negative for hearing loss, ear pain, nosebleeds, congestion, sore throat, mouth sores, trouble swallowing, neck stiffness, dental problem, voice change, sinus pressure and tinnitus.   Eyes: Negative for photophobia, pain, redness and visual disturbance.  Respiratory: Negative for cough, chest tightness and shortness of breath.   Cardiovascular: Negative for chest pain, palpitations and leg swelling.  Gastrointestinal: Negative  for nausea, vomiting, abdominal pain, diarrhea, constipation, blood in stool, abdominal distention and rectal pain.  Genitourinary: Negative for dysuria, urgency, frequency, hematuria, flank pain, vaginal bleeding, vaginal discharge, difficulty  urinating, genital sores, vaginal pain, menstrual problem and pelvic pain.  Musculoskeletal: Negative for back pain and arthralgias.  Skin: Negative for rash.  Neurological: Negative for dizziness, syncope, speech difficulty, weakness, light-headedness, numbness and headaches.  Hematological: Negative for adenopathy. Does not bruise/bleed easily.  Psychiatric/Behavioral: Negative for suicidal ideas, behavioral problems, self-injury, dysphoric mood and agitation. The patient is not nervous/anxious.        Objective:   Physical Exam  Constitutional: She is oriented to person, place, and time. She appears well-developed and well-nourished.  HENT:  Head: Normocephalic and atraumatic.  Right Ear: External ear normal.  Left Ear: External ear normal.  Mouth/Throat: Oropharynx is clear and moist.  Eyes: Conjunctivae and EOM are normal.  Neck: Normal range of motion. Neck supple. No JVD present. No thyromegaly present.  Cardiovascular: Normal rate, regular rhythm, normal heart sounds and intact distal pulses.   No murmur heard. Pulmonary/Chest: Effort normal and breath sounds normal. She has no wheezes. She has no rales.  Abdominal: Soft. Bowel sounds are normal. She exhibits no distension and no mass. There is no tenderness. There is no rebound and no guarding.  Musculoskeletal: Normal range of motion. She exhibits no edema and no tenderness.  Neurological: She is alert and oriented to person, place, and time. She has normal reflexes. No cranial nerve deficit. She exhibits normal muscle tone. Coordination normal.  Skin: Skin is warm and dry. No rash noted.  Psychiatric: She has a normal mood and affect. Her behavior is normal.          Assessment & Plan:   Unremarkable annual health assessment Hypertension well controlled Dyslipidemia. We'll check a lipid profile Hypothyroidism. We'll check a TSH  Restricted salt diet regular exercise encouraged the patient has recently obtained a  stationary bike we'll reassess in 6 months

## 2010-08-02 NOTE — H&P (Signed)
[facility]  Patient:    Christine Perez, Christine Perez                       MRN: [id number] Adm. Date:  10/11/99 Attending:  Caralyn Guile. Arlyce Dice, M.D.                         History and Physical  DATE OF BIRTH:  1925/11/13  HISTORY OF PRESENT ILLNESS:  This is a 77 year old, gravida 2, para 2, who is being admitted for vaginal hysterectomy, laparoscopically-assisted bilateral salpingo-oophorectomy, and anterior and posterior repair.  HISTORY OF PRESENT ILLNESS:  This patient presented to the office complaining of uterine prolapse approximately two weeks prior to evaluation in June.  The patient said she noted a mass between her legs.  She went to her family doctor who referred her to a gynecologist for evaluation.  The patient denies any urinary stress incontinence.  The patient denies any problems with sexual relations.  The patient also denies any problem passing stool.  PAST MEDICAL HISTORY:  Positive for hypertension.  She also has history of hypothyroidism and history of pneumonia.  The patient has never had a blood transfusion.  PAST SURGICAL HISTORY:  Includes a cholecystectomy.  MEDICATIONS: Includes Lipitor, Cozaar, Ziac, and Synthroid.  DRUG ALLERGIES:  CODEINE.  SOCIAL HISTORY:  Negative for alcohol or nicotine abuse.  Also negative for any recreational drug use.  FAMILY HISTORY:   Positive for cancer of breast in her sister who is 86 years old and colon cancer in her mother.  Her mother also had hypertension.  REVIEW OF SYSTEMS:  Negative in detail.  PHYSICAL EXAMINATION:  HEENT:  Her ears, nose, and throat are normal.  NECK:  Supple without thyromegaly.  BREASTS:  Without masses.  HEART:  Regular sinus rhythm without murmurs or gallops.  LUNGS:  Clear.  ABDOMEN:  Soft without hepatosplenomegaly.  PELVIC:  Revealed a 4+ cystocele.  Her vagina was otherwise normal.  Cervix and uterus were normal  Adnexa were not palpable.  RECTAL:   Her rectum showed a 3+ cystocele.  ADMISSION DIAGNOSIS:  Symptomatic cystocele.  No urinary stress incontinence.  The operative procedure planned will be laparoscopically-assisted vaginal hysterectomy mainly because the patient requested that her ovaries be removed. In addition, an anterior and posterior repair will be carried out to correct the chief complaint. DD:  10/09/99 TD:  10/09/99 Job: [id number]

## 2010-08-02 NOTE — Op Note (Signed)
[facility]  Patient:    Christine Perez, Christine Perez                     MRN: [id number] Proc. Date: 10/11/99 Adm. Date:  [id number] Attending:  Lars Pinks                           Operative Report  PREOPERATIVE DIAGNOSIS:  Symptomatic cystocele with prolapse.  POSTOPERATIVE DIAGNOSIS:  Symptomatic cystocele with prolapse.  OPERATION PERFORMED:  Laparoscopically assisted vaginal hysterectomy and bilateral salpingo-oophorectomy, anterior and posterior repair.  SURGEON:  Richard D. Arlyce Dice, M.D.  ASSISTANT:  Luvenia Redden, M.D.  ANESTHESIA:  General endotracheal.  ESTIMATED BLOOD LOSS:  200 cc.  FINDINGS:  A 4+ cystocele, 2+ rectocele, normal appearing tubes, ovaries and uterus.  INDICATIONS FOR PROCEDURE:  The patient is a 77 year old female who presented to the office complaining of a mass between her legs.  She also complained of difficulty emptying her bladder.  On office evaluation, a 4 cystocele was confirmed to be present and options were discussed with the patient, which included pessary before surgery.  The patient opted for surgery.  She also requested that her ovaries be removed prophylactically along with her uterus and therefore she was scheduled for laparoscopically assisted bilateral salpingo-oophorectomy, vaginal hysterectomy, anterior and posterior repair.  DESCRIPTION OF PROCEDURE:  The patient was taken to the operating room, placed in the supine position and general endotracheal anesthesia introduced.  She was then placed in modified dorsal lithotomy position.  The abdomen, vagina and perineum were prepped and draped in sterile fashion.  The Hulka tenaculum was placed in the endocervical canal and fixed to the anterior lip of the cervix.  The bladder was emptied.   Surgeon regowned and gloved.  Incision was made in the base of the umbilicus.  The Veress needle was introduced to the peritoneal cavity and a pneumoperitoneum was  created.  The laparoscopic trocar was introduced into the peritoneal cavity and accessory instruments were placed in the right and left lower quadrant 5 cm from the midline.  The pelvis was viewed with the findings noted above which were normal-appearing tubes, ovaries and uterus.  The cutting bipolar forceps were used to cauterize and divide the round ligaments.  The infundibulopelvic ligament was then isolated with the ureter identified well below the operative site.  The infundibulopelvic ligaments were cauterized and cut with the cutting forceps. The tissue then between the round ligament and the infundibulopelvic ligament were then cauterized and incised.  With the adnexa free the surgery continued at the vaginal site.  The patient was placed in the true dorsal lithotomy position.  The Hulka tenaculum was removed and Jacobs tenaculum was fixed to the cervix.  The vaginal tissues were infiltrated with a dilute lidocaine epinephrine solution of 1% lidocaine 1:200,000 epinephrine.  The cervix was then circumcised.  The anterior cul-de-sac was then entered sharply.  The posterior cul-de-sac was then entered and the uterosacral ligaments and the uterine arteries were clamped, cut and ligated.  The uterus and adnexa were then delivered and the remaining portion of the upper broad ligament was clamped, cut and ligated.  The attention was then turned to the anterior repair.  The posterior cuff was then closed with a running interlocking Vicryl 1 suture.  A ____________ suture between the uterosacrals and the cul-de-sac was performed to obliterate the cul-de-sac and the peritoneum was closed with a running suture.  At this point the attention was turned to the anterior repair.  The anterior portion of the vaginal cuff was grasped.  The vagina mucosa was dissected free from the underlying bladder and this dissection was continued in the midline.  The bladder was then freed from the vaginal  mucosa. A large cystocele was noted and this was noted and this was closed with concentric pursestring sutures.  The cystocele was then repaired with Kelly plication sutures paying special attention to support the urethrovesical junction.  The vaginal mucosa was closed with vertical mattress sutures.  The attention was then turned to the posterior repair.  A V-incision was made in the perineum.  The mucosa of the vagina was then dissected free from the rectocele.  The mucosa was incised in the midline.  After the rectocele was dissected free, the levator muscles were reapposed in the midline to create good support.  The vagina was then closed with a running interlocking 3-0 Vicryl suture and the perineum was repaired with subcuticular 3-0 Vicryl suture.  The vagina was then packed with iodoform gauze and the bladder was catheterized with a Foley catheter.  The laparoscope was then reinserted into the peritoneum and with low pressure the infundibulopelvic ligament was inspected and was seen to be dry.  The procedure was then terminated and the patient left the operating room in good condition. DD:  10/11/99 TD:  10/14/99 Job: [id number]

## 2010-08-02 NOTE — Discharge Summary (Signed)
[facility]  Patient:    Christine Perez                      MRN: [id number] Adm. Date:  [id number] Disc. Date: [id number] Attending:  Lars Pinks CC:         Gordy Savers, M.D. [facility]                           Discharge Summary  FINAL DIAGNOSES: 1. Pelvic prolapse. 2. Cystocele, rectocele.  SECONDARY DIAGNOSES: 1. Mild hypertension. 2. Hypercholesterolemia.  PROCEDURES:  Laparoscopically assisted transvaginal hysterectomy and bilateral salpingo-oophorectomy, anterior and posterior repair.  CONDITION ON DISCHARGE:  Improved.  COMPLICATIONS:  None.  HISTORY OF PRESENT ILLNESS:  This is a 77 year old, gravida 2, para 2, who was admitted to the hospital for vaginal hysterectomy, laparoscopically assisted, bilateral salpingo-oophorectomy and anterior and posterior repair.  The patient presented to the office complaining of uterine prolapse which she had noticed approximately two weeks prior to her initial office visit in June. The patient said she noticed a mass between her legs, and this was confirmed by her family doctor who referred her for gynecological evaluation.  The patient was not having any urinary stress incontinence or problem moving her bowels.  She did complain of some difficulty in emptying her bladder.  On evaluation, a 4+ cystocele was noted with secondary uterine descensus and a rectocele.  HOSPITAL COURSE:  The patient was taken to the operating room on the day of admission where a laparoscopically assisted vaginal hysterectomy with bilateral salpingo-oophorectomy was performed, along with an anterior and posterior repair.  The patients surgery went without complication. Postoperatively, the patients course was benign.  She developed no fever postoperatively.  Her blood count fell to 10 g and remained stable.  The Foley was left in place for 48 hours after surgery and then discontinued, and the patient voiding  regularly on her own.  She was observed for 24 hours after the Foley was discontinued to make sure that she would not develop significant residual.  On the morning of the third postoperative day, the patient was felt ready for discharge.  She had a bowel movement.  She was voiding well with over 200 cc per void.  She had no complaints.  Her abdomen was soft.  Her incisions were healing well.  She was, therefore, felt ready for discharge. She was discharged on a regular diet, told to limit her activities.  She was given Dilaudid 2 mg to take 1 every four hours for pain that was not relieved by over-the-counter pain medicine.  She also requested Phenergan for occasional nausea, and she was given 25 mg rectal suppositories to take every four hours p.r.n.  She was asked to return to the office in two weeks for follow-up evaluation.  She would also resume her preoperative antihypertensive and cholesterol medications.  She was also on low doses of Synthroid, which she will continue.  LABORATORY DATA:  Preoperative laboratories showed her hemoglobin was 12.9 with a white count of 5100.  Her postoperative hemoglobin was 10.4 with a white count of 8200.  Urinalysis showed moderate leukocyte esterase on admission with 11-20 wbcs.  This was treated during this hospitalization with Macrodantin twice a day for three days.  In addition, she had received a prophylactic Ancef dose prior to surgery, which also should be effective against any UTI.  She also had a set of chemistries  done during this hospitalization which showed some elevation of her SGOT and SGPT, and these were mild elevations and will be repeated postoperatively to make sure that they are not significant.  A chest x-ray was done preoperatively which showed chronic lung disease and cardiomegaly, but no acute findings.  An EKG was done which showed normal sinus rhythm with occasional premature atrial complexes, otherwise normal. DD:   10/14/99 TD:  10/15/99 Job: [id number]

## 2010-10-29 ENCOUNTER — Other Ambulatory Visit: Payer: Self-pay | Admitting: Internal Medicine

## 2011-01-09 ENCOUNTER — Ambulatory Visit (INDEPENDENT_AMBULATORY_CARE_PROVIDER_SITE_OTHER): Payer: Medicare Other | Admitting: Internal Medicine

## 2011-01-09 ENCOUNTER — Encounter: Payer: Self-pay | Admitting: Internal Medicine

## 2011-01-09 VITALS — BP 110/70 | Temp 97.9°F | Wt 145.0 lb

## 2011-01-09 DIAGNOSIS — Z23 Encounter for immunization: Secondary | ICD-10-CM

## 2011-01-09 DIAGNOSIS — E785 Hyperlipidemia, unspecified: Secondary | ICD-10-CM

## 2011-01-09 DIAGNOSIS — I10 Essential (primary) hypertension: Secondary | ICD-10-CM

## 2011-01-09 DIAGNOSIS — R42 Dizziness and giddiness: Secondary | ICD-10-CM

## 2011-01-09 DIAGNOSIS — E039 Hypothyroidism, unspecified: Secondary | ICD-10-CM

## 2011-01-09 DIAGNOSIS — Z0000 Encounter for general adult medical examination without abnormal findings: Secondary | ICD-10-CM

## 2011-01-09 MED ORDER — MECLIZINE HCL 25 MG PO TABS: 25.0000 mg | ORAL_TABLET | Freq: Three times a day (TID) | ORAL | Status: DC | PRN

## 2011-01-09 MED ORDER — LEVOTHYROXINE SODIUM 75 MCG PO TABS: 75.0000 ug | ORAL_TABLET | Freq: Every day | ORAL | Status: DC

## 2011-01-09 MED ORDER — LOSARTAN POTASSIUM-HCTZ 100-12.5 MG PO TABS: 1.0000 | ORAL_TABLET | Freq: Every day | ORAL | Status: DC

## 2011-01-09 MED ORDER — ATORVASTATIN CALCIUM 20 MG PO TABS: 20.0000 mg | ORAL_TABLET | Freq: Every day | ORAL | Status: DC

## 2011-01-09 NOTE — Progress Notes (Signed)
  Subjective:    Patient ID: Christine Perez, female    DOB: November 28, 1925, 77 y.o.   MRN: [id number]  HPI  77 year old patient who is in today for followup. She is treated hypothyroidism on supplemental Synthroid. She remains on atorvastatin for dyslipidemia and Hyzaar for blood pressure control. She has a history of vertigo and had a recent episode. Thankfully they're very infrequent and self limiting. She does benefit from when necessary meclizine. She is doing quite well only will complaint is some diminished auditory acuity. She was concerned about ear wax. Denies any cardiopulmonary complaints although her exercise capacity has diminished somewhat    Review of Systems  Constitutional: Negative.   HENT: Negative for hearing loss, congestion, sore throat, rhinorrhea, dental problem, sinus pressure and tinnitus.   Eyes: Negative for pain, discharge and visual disturbance.  Respiratory: Negative for cough and shortness of breath.   Cardiovascular: Negative for chest pain, palpitations and leg swelling.  Gastrointestinal: Negative for nausea, vomiting, abdominal pain, diarrhea, constipation, blood in stool and abdominal distention.  Genitourinary: Negative for dysuria, urgency, frequency, hematuria, flank pain, vaginal bleeding, vaginal discharge, difficulty urinating, vaginal pain and pelvic pain.  Musculoskeletal: Negative for joint swelling, arthralgias and gait problem.  Skin: Negative for rash.  Neurological: Negative for dizziness, syncope, speech difficulty, weakness, numbness and headaches.  Hematological: Negative for adenopathy.  Psychiatric/Behavioral: Negative for behavioral problems, dysphoric mood and agitation. The patient is not nervous/anxious.        Objective:   Physical Exam  Constitutional: She is oriented to person, place, and time. She appears well-developed and well-nourished.  HENT:  Head: Normocephalic.  Right Ear: External ear normal.  Left Ear: External ear  normal.  Mouth/Throat: Oropharynx is clear and moist.  Eyes: Conjunctivae and EOM are normal. Pupils are equal, round, and reactive to light.  Neck: Normal range of motion. Neck supple. No thyromegaly present.  Cardiovascular: Normal rate, regular rhythm, normal heart sounds and intact distal pulses.   Pulmonary/Chest: Effort normal and breath sounds normal.  Abdominal: Soft. Bowel sounds are normal. She exhibits no mass. There is no tenderness.  Musculoskeletal: Normal range of motion.  Lymphadenopathy:    She has no cervical adenopathy.  Neurological: She is alert and oriented to person, place, and time.  Skin: Skin is warm and dry. No rash noted.  Psychiatric: She has a normal mood and affect. Her behavior is normal.          Assessment & Plan:   Hypertension well controlled. Will continue present regimen low-salt diet encouraged Hypothyroidism stable Dyslipidemia. We'll check lipid profile next visit

## 2011-01-09 NOTE — Patient Instructions (Signed)
Limit your sodium (Salt) intake    It is important that you exercise regularly, at least 20 minutes 3 to 4 times per week.  If you develop chest pain or shortness of breath seek  medical attention.  Return in 6 months for follow-up  

## 2011-04-21 ENCOUNTER — Telehealth: Payer: Self-pay | Admitting: Family Medicine

## 2011-04-21 NOTE — Telephone Encounter (Signed)
[facility] Triage Call Report Triage Record Num: [id number] Operator: Freddie Breech Patient Name: Christine Perez Call Date & Time: 04/20/2011 8:53:29AM Patient Phone: [phone] PCP: Gordy Savers Patient Gender: Female PCP Fax : [phone] Patient DOB: June 09, 1925 Practice Name: [facility] Reason for Call: Caller: Kathy/Other; PCP: Eleonore Chiquito; CB#: [phone]; Call regarding Flu symptoms, can something be called in?; Pt began having diarrhea and vomiting at 0400 today. Feels slightly warm to touch. No cold sx. Homecare advised per N/V Guideline. Per SO RN called in Phenergan 25 mg pills # 6 1 po q 4 hrs nr to [facility] [phone]. Call back parameters reviwed. Protocol(s) Used: Nausea or Vomiting Recommended Outcome per Protocol: Provide Home/Self Care Reason for Outcome: New onset of 3-4 episodes vomiting or diarrhea following mild abdominal cramping Care Advice: Antidiarrheal medications are usually unnecessary. Use only after consulting your provider. Application of A&D ointment or witch hazel medicated pads may help relieve anal irritation. ~ ~ Call provider if symptoms do not improve after 24 hours of home care. Call provider immediately if develop severe pain, black, tarry stools, bloody stools, blood-streaked or coffee ground-looking vomitus, or abdomen swollen. ~ ~ SYMPTOM / CONDITION MANAGEMENT Go to the ED if you have developed signs and symptoms of dehydration such as very dry mouth and tongue; increased pulse rate at rest; no urine output for 8 hours or more; increasing weakness or drowsiness, or lightheadedness when trying to sit upright or standing. ~ Vomiting and Diarrhea Care: - Do not eat solid foods until vomiting subsides. - Begin taking fluids by sucking on ice chips or popsicles or taking sips of cool, clear fluids (soda, fruit juices that are low acid, sports drinks or nonprescription oral rehydration solution). - Gradually drink  larger amounts of these fluids so that you are drinking six to eight 8 oz. (1.2 to 1.6 liters) of fluids a day. - Keep activity to a minimum. - Once vomiting and diarrhea subside, eat smaller, more frequent meals of easily digested foods such as crackers, toast, bananas, rice, cooked cereal, applesauce, broth, baked or mashed potatoes, chicken or [place] without skin. Eat slowly. - Take fluids 30 minutes before or 60 minutes after meals. - Avoid high fat, highly seasoned, high fiber or high sugar content foods. - Avoid extremely hot or cold foods. - Do not take pain medication (such as aspirin, NSAIDs) while nauseated or vomiting. - Do not drink caffeinated or alcoholic beverages. ~

## 2011-05-19 DIAGNOSIS — Z1231 Encounter for screening mammogram for malignant neoplasm of breast: Secondary | ICD-10-CM | POA: Diagnosis not present

## 2011-05-20 ENCOUNTER — Encounter: Payer: Self-pay | Admitting: Internal Medicine

## 2011-05-20 DIAGNOSIS — Z1231 Encounter for screening mammogram for malignant neoplasm of breast: Secondary | ICD-10-CM | POA: Diagnosis not present

## 2011-05-20 DIAGNOSIS — R928 Other abnormal and inconclusive findings on diagnostic imaging of breast: Secondary | ICD-10-CM | POA: Diagnosis not present

## 2011-05-21 DIAGNOSIS — R928 Other abnormal and inconclusive findings on diagnostic imaging of breast: Secondary | ICD-10-CM | POA: Diagnosis not present

## 2011-05-22 ENCOUNTER — Encounter: Payer: Self-pay | Admitting: Internal Medicine

## 2011-05-29 ENCOUNTER — Encounter: Payer: Self-pay | Admitting: Internal Medicine

## 2011-06-05 DIAGNOSIS — Z961 Presence of intraocular lens: Secondary | ICD-10-CM | POA: Diagnosis not present

## 2011-06-05 DIAGNOSIS — H26499 Other secondary cataract, unspecified eye: Secondary | ICD-10-CM | POA: Diagnosis not present

## 2011-06-05 DIAGNOSIS — H04129 Dry eye syndrome of unspecified lacrimal gland: Secondary | ICD-10-CM | POA: Diagnosis not present

## 2011-07-03 ENCOUNTER — Other Ambulatory Visit: Payer: Self-pay | Admitting: Internal Medicine

## 2011-07-14 ENCOUNTER — Encounter: Payer: Self-pay | Admitting: Internal Medicine

## 2011-07-14 ENCOUNTER — Ambulatory Visit (INDEPENDENT_AMBULATORY_CARE_PROVIDER_SITE_OTHER): Payer: Medicare Other | Admitting: Internal Medicine

## 2011-07-14 VITALS — BP 130/84 | HR 72 | Temp 98.0°F | Resp 18 | Ht 60.0 in | Wt 143.0 lb

## 2011-07-14 DIAGNOSIS — D485 Neoplasm of uncertain behavior of skin: Secondary | ICD-10-CM | POA: Diagnosis not present

## 2011-07-14 DIAGNOSIS — E785 Hyperlipidemia, unspecified: Secondary | ICD-10-CM

## 2011-07-14 DIAGNOSIS — E039 Hypothyroidism, unspecified: Secondary | ICD-10-CM | POA: Diagnosis not present

## 2011-07-14 DIAGNOSIS — Z0000 Encounter for general adult medical examination without abnormal findings: Secondary | ICD-10-CM | POA: Diagnosis not present

## 2011-07-14 DIAGNOSIS — I10 Essential (primary) hypertension: Secondary | ICD-10-CM

## 2011-07-14 DIAGNOSIS — I499 Cardiac arrhythmia, unspecified: Secondary | ICD-10-CM | POA: Diagnosis not present

## 2011-07-14 DIAGNOSIS — L989 Disorder of the skin and subcutaneous tissue, unspecified: Secondary | ICD-10-CM

## 2011-07-14 LAB — CBC WITH DIFFERENTIAL/PLATELET
Basophils Absolute: 0 10*3/uL (ref 0.0–0.1)
Eosinophils Absolute: 0 10*3/uL (ref 0.0–0.7)
MCHC: 33.7 g/dL (ref 30.0–36.0)
MCV: 92.5 fl (ref 78.0–100.0)
Monocytes Absolute: 0.5 10*3/uL (ref 0.1–1.0)
Neutrophils Relative %: 53.6 % (ref 43.0–77.0)
Platelets: 114 10*3/uL — ABNORMAL LOW (ref 150.0–400.0)
RDW: 14.4 % (ref 11.5–14.6)
WBC: 4 10*3/uL — ABNORMAL LOW (ref 4.5–10.5)

## 2011-07-14 LAB — COMPREHENSIVE METABOLIC PANEL
ALT: 19 U/L (ref 0–35)
AST: 27 U/L (ref 0–37)
Albumin: 4.2 g/dL (ref 3.5–5.2)
Alkaline Phosphatase: 74 U/L (ref 39–117)
Glucose, Bld: 92 mg/dL (ref 70–99)
Potassium: 4.9 mEq/L (ref 3.5–5.1)
Sodium: 143 mEq/L (ref 135–145)
Total Protein: 7.2 g/dL (ref 6.0–8.3)

## 2011-07-14 LAB — LIPID PANEL
Total CHOL/HDL Ratio: 3
VLDL: 22.8 mg/dL (ref 0.0–40.0)

## 2011-07-14 LAB — TSH: TSH: 1.28 u[IU]/mL (ref 0.35–5.50)

## 2011-07-14 MED ORDER — ZOLPIDEM TARTRATE 10 MG PO TABS: 5.0000 mg | ORAL_TABLET | Freq: Every evening | ORAL | Status: DC | PRN

## 2011-07-14 MED ORDER — ATORVASTATIN CALCIUM 20 MG PO TABS: 20.0000 mg | ORAL_TABLET | Freq: Every day | ORAL | Status: DC

## 2011-07-14 MED ORDER — LOSARTAN POTASSIUM-HCTZ 100-12.5 MG PO TABS: 1.0000 | ORAL_TABLET | Freq: Every day | ORAL | Status: DC

## 2011-07-14 MED ORDER — LEVOTHYROXINE SODIUM 75 MCG PO TABS: 75.0000 ug | ORAL_TABLET | Freq: Every day | ORAL | Status: DC

## 2011-07-14 NOTE — Patient Instructions (Signed)
Please check your blood pressure on a regular basis.  If it is consistently greater than 150/90, please make an office appointment.  Limit your sodium (Salt) intake    It is important that you exercise regularly, at least 20 minutes 3 to 4 times per week.  If you develop chest pain or shortness of breath seek  medical attention.  Take a calcium supplement, plus [phone] units of vitamin D  Return in 6 months for follow-up

## 2011-07-14 NOTE — Progress Notes (Signed)
Subjective:    Patient ID: Christine Perez, female    DOB: May 23, 1925, 78 y.o.   MRN: [id number]  HPI  HPI 78 year old patient who is seen today for an annual exam. Medical problems include hypothyroidism as well as dyslipidemia. She has treated hypertension. She is doing quite well. She does have a history of colonic polyps and underwent a colonoscopy in the past for approximately 3 years ago; no major concerns or complaints today    Alcohol-Tobacco  Smoking Status: never  Allergies:  1) ! Codeine   Past History:  Past Medical History:  Reviewed history from 10/09/2006 and no changes required.  Hypertension  Hypercholesterolemia  Colonic polyps, hx of  Hyperlipidemia  Hypothyroidism  Osteoporosis   Past Surgical History:  Appendectomy  Cholecystectomy  Hysterectomy  Lumpectomy-left  Tonsillectomy  cataracts 5-10   Family History:  Reviewed history from 10/09/2006 and no changes required.  Family History Other cancer-Colon, breast  Fam hxMI   Social History:  Reviewed history from 10/09/2006 and no changes required.  Married  Alcohol use-no  NonsmokerSmoking Status: never   Medicare Wellness  1. Risk factors, based on past M,S,F history-cardiovascular risk factors include hypertension dyslipidemia  2. Physical activities: Fairly sedentary but no restrictions  3. Depression/mood: No history depression or mood disorder  4. Hearing: No deficits  5. ADL's: Independent in all aspects of daily living  6. Fall risk: Low to moderate due to a and mild obesity  7. Home safety: No problems identified. Lives with her daughter  14. Height weight, and visual acuity; height and weight stable does have a history of osteoporosis and kyphosis. Recently passed her driver's license exam with no restrictions with glasses she has a cataract extraction surgery in the past  9. Counseling: Heart healthy diet modest weight loss and more regular exercise all encouraged  10. Lab orders based  on risk factors: Laboratory profile including TSH and lipid panel will be reviewed  11. Referral: Not appropriate at this time. Has had a recent mammogram  12. Care plan: Restricted salt diet exercise weight loss encouraged  13. Cognitive assessment: Alert and appropriate with normal affect. No cognitive dysfunction   Past Medical History  Diagnosis Date  . CARDIAC ARRHYTHMIA 01/08/2010  . COLONIC POLYPS, HX OF 10/09/2006  . HYPERLIPIDEMIA 10/09/2006  . HYPERTENSION 10/09/2006  . HYPOTHYROIDISM 10/09/2006  . LASSITUDE 08/15/2009  . OSTEOPOROSIS 10/09/2006  . VERTIGO 08/07/2009    History   Social History  . Marital Status: Single    Spouse Name: N/A    Number of Children: N/A  . Years of Education: N/A   Occupational History  . Not on file.   Social History Main Topics  . Smoking status: Never Smoker   . Smokeless tobacco: Never Used  . Alcohol Use: No  . Drug Use: No  . Sexually Active: Not on file   Other Topics Concern  . Not on file   Social History Narrative  . No narrative on file    Past Surgical History  Procedure Date  . Appendectomy   . Cholecystectomy   . Abdominal hysterectomy   . Breast surgery     left - lumpectomy  . Tonsillectomy   . Cataract extraction     No family history on file.  Allergies  Allergen Reactions  . Codeine     Current Outpatient Prescriptions on File Prior to Visit  Medication Sig Dispense Refill  . atorvastatin (LIPITOR) 20 MG tablet Take 1 tablet (20 mg  total) by mouth daily.  90 tablet  6  . levothyroxine (SYNTHROID, LEVOTHROID) 75 MCG tablet Take 1 tablet (75 mcg total) by mouth daily.  90 tablet  6  . losartan-hydrochlorothiazide (HYZAAR) 100-12.5 MG per tablet Take 1 tablet by mouth daily.  90 tablet  6  . meclizine (ANTIVERT) 25 MG tablet Take 1 tablet (25 mg total) by mouth 3 (three) times daily as needed.  30 tablet  4  . zolpidem (AMBIEN) 10 MG tablet TAKE ONE TABLET BY MOUTH AT BEDTIME AS NEEDED  60 tablet  1     BP 130/84  Pulse 72  Temp(Src) 98 F (36.7 C) (Oral)  Resp 18  Ht 5' (1.524 m)  Wt 143 lb (64.864 kg)  BMI 27.93 kg/m2  SpO2 98%      Review of Systems  Constitutional: Negative for fever, appetite change, fatigue and unexpected weight change.  HENT: Positive for hearing loss. Negative for ear pain, nosebleeds, congestion, sore throat, mouth sores, trouble swallowing, neck stiffness, dental problem, voice change, sinus pressure and tinnitus.   Eyes: Negative for photophobia, pain, redness and visual disturbance.  Respiratory: Negative for cough, chest tightness and shortness of breath.   Cardiovascular: Negative for chest pain, palpitations and leg swelling.  Gastrointestinal: Negative for nausea, vomiting, abdominal pain, diarrhea, constipation, blood in stool, abdominal distention and rectal pain.  Genitourinary: Negative for dysuria, urgency, frequency, hematuria, flank pain, vaginal bleeding, vaginal discharge, difficulty urinating, genital sores, vaginal pain, menstrual problem and pelvic pain.  Musculoskeletal: Negative for back pain and arthralgias.  Skin: Negative for rash.  Neurological: Negative for dizziness, syncope, speech difficulty, weakness, light-headedness, numbness and headaches.  Hematological: Negative for adenopathy. Does not bruise/bleed easily.  Psychiatric/Behavioral: Negative for suicidal ideas, behavioral problems, self-injury, dysphoric mood and agitation. The patient is not nervous/anxious.        Objective:   Physical Exam  Constitutional: She is oriented to person, place, and time. She appears well-developed and well-nourished.  HENT:  Head: Normocephalic and atraumatic.  Right Ear: External ear normal.  Left Ear: External ear normal.  Mouth/Throat: Oropharynx is clear and moist.  Eyes: Conjunctivae and EOM are normal.  Neck: Normal range of motion. Neck supple. No JVD present. No thyromegaly present.  Cardiovascular: Normal rate, regular  rhythm, normal heart sounds and intact distal pulses.   No murmur heard. Pulmonary/Chest: Effort normal and breath sounds normal. She has no wheezes. She has no rales.  Abdominal: Soft. Bowel sounds are normal. She exhibits no distension and no mass. There is no tenderness. There is no rebound and no guarding.       Abdominal scar mildly obese no masses  Musculoskeletal: Normal range of motion. She exhibits no edema and no tenderness.  Neurological: She is alert and oriented to person, place, and time. She has normal reflexes. No cranial nerve deficit. She exhibits normal muscle tone. Coordination normal.  Skin: Skin is warm and dry. No rash noted.  Psychiatric: She has a normal mood and affect. Her behavior is normal.    5 mm slightly raised papule right outer forearm. This was treated with cryotherapy without difficulty      Assessment & Plan:   Preventive health exam Hypothyroidism. We'll check a TSH dyslipidemia we'll check a lipid profile Hypertension stable Benign skin lesion right forearm. Status post cryotherapy   Recheck 6 months

## 2011-10-07 DIAGNOSIS — M6530 Trigger finger, unspecified finger: Secondary | ICD-10-CM | POA: Diagnosis not present

## 2011-11-05 ENCOUNTER — Ambulatory Visit: Payer: Medicare Other | Admitting: Internal Medicine

## 2011-11-20 ENCOUNTER — Encounter: Payer: Self-pay | Admitting: Internal Medicine

## 2011-11-20 DIAGNOSIS — Z09 Encounter for follow-up examination after completed treatment for conditions other than malignant neoplasm: Secondary | ICD-10-CM | POA: Diagnosis not present

## 2011-11-20 DIAGNOSIS — N6009 Solitary cyst of unspecified breast: Secondary | ICD-10-CM | POA: Diagnosis not present

## 2011-11-20 DIAGNOSIS — N6019 Diffuse cystic mastopathy of unspecified breast: Secondary | ICD-10-CM | POA: Diagnosis not present

## 2011-11-20 LAB — HM MAMMOGRAPHY

## 2012-01-13 ENCOUNTER — Encounter: Payer: Self-pay | Admitting: Internal Medicine

## 2012-01-13 ENCOUNTER — Ambulatory Visit (INDEPENDENT_AMBULATORY_CARE_PROVIDER_SITE_OTHER): Payer: Medicare Other | Admitting: Internal Medicine

## 2012-01-13 VITALS — BP 110/80

## 2012-01-13 DIAGNOSIS — E559 Vitamin D deficiency, unspecified: Secondary | ICD-10-CM | POA: Diagnosis not present

## 2012-01-13 DIAGNOSIS — Z23 Encounter for immunization: Secondary | ICD-10-CM | POA: Diagnosis not present

## 2012-01-13 DIAGNOSIS — M810 Age-related osteoporosis without current pathological fracture: Secondary | ICD-10-CM | POA: Diagnosis not present

## 2012-01-13 DIAGNOSIS — E039 Hypothyroidism, unspecified: Secondary | ICD-10-CM

## 2012-01-13 DIAGNOSIS — M549 Dorsalgia, unspecified: Secondary | ICD-10-CM

## 2012-01-13 DIAGNOSIS — I10 Essential (primary) hypertension: Secondary | ICD-10-CM | POA: Diagnosis not present

## 2012-01-13 MED ORDER — ALENDRONATE SODIUM 70 MG PO TABS: 70.0000 mg | ORAL_TABLET | ORAL | Status: DC

## 2012-01-13 MED ORDER — TRAMADOL HCL 50 MG PO TABS: 50.0000 mg | ORAL_TABLET | Freq: Three times a day (TID) | ORAL | Status: DC | PRN

## 2012-01-13 NOTE — Patient Instructions (Signed)
Take a calcium supplement, plus 800-1200 units of vitamin D    It is important that you exercise regularly, at least 20 minutes 3 to 4 times per week.  If you develop chest pain or shortness of breath seek  medical attention.  Return in 6 months for follow-up  

## 2012-01-13 NOTE — Progress Notes (Signed)
Subjective:    Patient ID: Christine Perez, female    DOB: [birth year], 78 y.o.   MRN: [id number]  HPI  78 year old patient who has a history of treated hypertension dyslipidemia and hypothyroidism. She presents with a 4-5 month history of worsening back pain and states that at times it is in the left flank area and other times in the left lumbar region. She is on calcium and vitamin D supplementation. She does have a history of osteoporosis but she is unclear of her prior treatment other than calcium and vitamin D. She does have mild kyphosis on clinical exam. She was seen 6 months ago for her annual exam that was unremarkable  Past Medical History  Diagnosis Date  . CARDIAC ARRHYTHMIA 01/08/2010  . COLONIC POLYPS, HX OF 10/09/2006  . HYPERLIPIDEMIA 10/09/2006  . HYPERTENSION 10/09/2006  . HYPOTHYROIDISM 10/09/2006  . LASSITUDE 08/15/2009  . OSTEOPOROSIS 10/09/2006  . VERTIGO 08/07/2009    History   Social History  . Marital Status: Single    Spouse Name: N/A    Number of Children: N/A  . Years of Education: N/A   Occupational History  . Not on file.   Social History Main Topics  . Smoking status: Never Smoker   . Smokeless tobacco: Never Used  . Alcohol Use: No  . Drug Use: No  . Sexually Active: Not on file   Other Topics Concern  . Not on file   Social History Narrative  . No narrative on file    Past Surgical History  Procedure Date  . Appendectomy   . Cholecystectomy   . Abdominal hysterectomy   . Breast surgery     left - lumpectomy  . Tonsillectomy   . Cataract extraction     No family history on file.  Allergies  Allergen Reactions  . Codeine     Current Outpatient Prescriptions on File Prior to Visit  Medication Sig Dispense Refill  . atorvastatin (LIPITOR) 20 MG tablet Take 1 tablet (20 mg total) by mouth daily.  90 tablet  6  . calcium-vitamin D (OSCAL WITH D) 500-200 MG-UNIT per tablet Take 1 tablet by mouth daily.      Marland Kitchen levothyroxine (SYNTHROID,  LEVOTHROID) 75 MCG tablet Take 1 tablet (75 mcg total) by mouth daily.  90 tablet  6  . losartan-hydrochlorothiazide (HYZAAR) 100-12.5 MG per tablet Take 1 tablet by mouth daily.  90 tablet  6  . meclizine (ANTIVERT) 25 MG tablet Take 1 tablet (25 mg total) by mouth 3 (three) times daily as needed.  30 tablet  4  . zolpidem (AMBIEN) 10 MG tablet Take 0.5 tablets (5 mg total) by mouth at bedtime as needed for sleep.  60 tablet  1    BP 110/80       Review of Systems  Constitutional: Negative.   HENT: Negative for hearing loss, congestion, sore throat, rhinorrhea, dental problem, sinus pressure and tinnitus.   Eyes: Negative for pain, discharge and visual disturbance.  Respiratory: Negative for cough and shortness of breath.   Cardiovascular: Negative for chest pain, palpitations and leg swelling.  Gastrointestinal: Negative for nausea, vomiting, abdominal pain, diarrhea, constipation, blood in stool and abdominal distention.  Genitourinary: Negative for dysuria, urgency, frequency, hematuria, flank pain, vaginal bleeding, vaginal discharge, difficulty urinating, vaginal pain and pelvic pain.  Musculoskeletal: Positive for back pain. Negative for joint swelling, arthralgias and gait problem.  Skin: Negative for rash.  Neurological: Negative for dizziness, syncope, speech difficulty, weakness, numbness and  headaches.  Hematological: Negative for adenopathy.  Psychiatric/Behavioral: Negative for behavioral problems, dysphoric mood and agitation. The patient is not nervous/anxious.        Objective:   Physical Exam  Constitutional: She is oriented to person, place, and time. She appears well-developed and well-nourished.  HENT:  Head: Normocephalic.  Right Ear: External ear normal.  Left Ear: External ear normal.  Mouth/Throat: Oropharynx is clear and moist.  Eyes: Conjunctivae normal and EOM are normal. Pupils are equal, round, and reactive to light.  Neck: Normal range of motion.  Neck supple. No thyromegaly present.  Cardiovascular: Normal rate, regular rhythm, normal heart sounds and intact distal pulses.   Pulmonary/Chest: Effort normal and breath sounds normal.  Abdominal: Soft. Bowel sounds are normal. She exhibits no mass. There is no tenderness.  Musculoskeletal: Normal range of motion.       Kyphosis noted  Lymphadenopathy:    She has no cervical adenopathy.  Neurological: She is alert and oriented to person, place, and time.  Skin: Skin is warm and dry. No rash noted.  Psychiatric: She has a normal mood and affect. Her behavior is normal.          Assessment & Plan:   Osteoporosis. No history of prior treatment will place on Fosamax. Check a vitamin D level Hypertension stable Dyslipidemia  We'll recheck in 6 months

## 2012-01-14 LAB — VITAMIN D 25 HYDROXY (VIT D DEFICIENCY, FRACTURES): Vit D, 25-Hydroxy: 71 ng/mL (ref 30–89)

## 2012-01-22 ENCOUNTER — Telehealth: Payer: Self-pay | Admitting: Internal Medicine

## 2012-01-22 NOTE — Telephone Encounter (Signed)
Caller: Kathy/Sibling; Patient Name: Christine Perez; PCP: Eleonore Chiquito [facility]); Best Callback Phone Number: [phone] Pt has had blurry vision that started last evening into this am. Pt has had a hx of cataracts which she had surgery for 1 year ago. Pt was in on Tuesday  01/20/12 and was started on Fosimax and Tramadol for back pain - pt took 1/2 of a 50mg  tablet this am. RN looked up both medications/blurred vision was not listed. Pts eye Dr. after the cataract surgery advised her if blurred vision occurred to call fro an appt. Pt will call and schedule asap today.

## 2012-02-13 ENCOUNTER — Telehealth: Payer: Self-pay | Admitting: Internal Medicine

## 2012-02-13 NOTE — Telephone Encounter (Signed)
Patient Information:  Caller Name: Samara Deist  Phone: [phone]  Patient: Christine Perez, Christine Perez  Gender: Female  DOB: 1925/04/14  Age: 78 Years  PCP: Eleonore Chiquito [facility])   Symptoms  Reason For Call & Symptoms: has hoarseness, sore throat and headache, sx started this am  Reviewed Health History In EMR: Yes  Reviewed Medications In EMR: Yes  Reviewed Allergies In EMR: Yes  Reviewed Surgeries / Procedures: Yes  Date of Onset of Symptoms: 02/13/2012  Guideline(s) Used:  Cough  Disposition Per Guideline:   Home Care  Reason For Disposition Reached:   Cough with cold symptoms (e.g., runny nose, postnasal drip, throat clearing)  Advice Given:  N/A  Office Follow Up:  Does the office need to follow up with this patient?: No  Instructions For The Office: N/A Offered Saturday am appointment at the [place] office but she declined.  Would like to try OTC medication first.

## 2012-03-07 ENCOUNTER — Other Ambulatory Visit: Payer: Self-pay | Admitting: Internal Medicine

## 2012-04-27 ENCOUNTER — Telehealth: Payer: Self-pay | Admitting: Internal Medicine

## 2012-04-27 NOTE — Telephone Encounter (Signed)
Caller: Kathy/Child; Phone: [phone]; Reason for Call: Recieved call from Patients daughter stating that she needs her [employer] renewal tag.  Patient has filled out forms and sent to the office approx last week.  Patient states that she has spoken with two staff members in the office in regards to needing the renewal however has not heard back.    OFFICE NOTE: PLEASE FOLLOW UP WITH PATIENT ON STATUS OF HANDICAP RENEWAL.

## 2012-04-28 NOTE — Telephone Encounter (Signed)
Spoke to pt's daughter Olegario Messier,  told her form for [employer] Tag was completed and faxed to [facility] in [place], [place]. Olegario Messier verbalized understanding.

## 2012-05-19 DIAGNOSIS — N63 Unspecified lump in unspecified breast: Secondary | ICD-10-CM | POA: Diagnosis not present

## 2012-05-20 ENCOUNTER — Telehealth: Payer: Self-pay | Admitting: Internal Medicine

## 2012-05-20 MED ORDER — ALPRAZOLAM 0.25 MG PO TABS: 0.2500 mg | ORAL_TABLET | ORAL | Status: DC | PRN

## 2012-05-20 NOTE — Telephone Encounter (Signed)
done

## 2012-05-20 NOTE — Telephone Encounter (Signed)
Rx called in please let patient know.

## 2012-05-20 NOTE — Telephone Encounter (Signed)
Caller: Serenitee/Patient; Phone: [phone]; Reason for Call: Patient is scheduled for a biopsy on next Tuesday, 05/25/12.  She is anxious about this, and has asked the procedural office about a sedative before the procedure.  They told her to speak with her PCP regarding a one time dose of a mild sedative to help with the anxiety before the procedure.  Please let patient know if this is something that could be prescribed.  Thanks.

## 2012-05-20 NOTE — Telephone Encounter (Signed)
Please call in a prescription for alprazolam 0.25 mg  #6 one every 4 hours as needed for anxiety

## 2012-05-25 DIAGNOSIS — D0590 Unspecified type of carcinoma in situ of unspecified breast: Secondary | ICD-10-CM | POA: Diagnosis not present

## 2012-05-25 DIAGNOSIS — R920 Mammographic microcalcification found on diagnostic imaging of breast: Secondary | ICD-10-CM | POA: Diagnosis not present

## 2012-05-25 DIAGNOSIS — Z0189 Encounter for other specified special examinations: Secondary | ICD-10-CM | POA: Diagnosis not present

## 2012-06-03 ENCOUNTER — Encounter (INDEPENDENT_AMBULATORY_CARE_PROVIDER_SITE_OTHER): Payer: Self-pay | Admitting: General Surgery

## 2012-06-03 ENCOUNTER — Ambulatory Visit (INDEPENDENT_AMBULATORY_CARE_PROVIDER_SITE_OTHER): Payer: Medicare Other | Admitting: General Surgery

## 2012-06-03 VITALS — BP 120/82 | HR 72 | Temp 97.0°F | Resp 12 | Ht 60.0 in | Wt 135.0 lb

## 2012-06-03 DIAGNOSIS — D0590 Unspecified type of carcinoma in situ of unspecified breast: Secondary | ICD-10-CM | POA: Diagnosis not present

## 2012-06-03 DIAGNOSIS — D0502 Lobular carcinoma in situ of left breast: Secondary | ICD-10-CM

## 2012-06-03 NOTE — Progress Notes (Signed)
Patient ID: Christine Perez, female   DOB: [birth year], 79 y.o.   MRN: [id number]  Chief Complaint  Patient presents with  . Other    Breast    HPI Christine Perez is a 79 y.o. female.  She is referred by Dr. Rogelia Mire at [facility] health for evaluation and surgical management of a focal area of LCIS in the left breast laterally. Dr. Victoriano Lain is her primary care physician.   The patient gets regular mammograms. Recent 3-D mammograms show a focal area of new calcifications in the upper-outer quadrant of the left breast. This is a small area. There was a density in the lateral aspect of the right breast which has been stable. Stereotactic core biopsy of the left breast calcifications in the upper outer quadrant showed lobular neoplasia, LCIS. Clip was deployed it appears well positioned radiographically.  The patient is here with her daughter. They're concerned because the patient's sister had breast cancer and the patient's maternal second cousin had breast cancer. Maternal aunt may have had ovarian cancer. The patient's mother had colon cancer  The patient is in pretty good health. She has hypertension, hyperlipidemia, mild anxiety on Xanax, hypothyroidism. She's had an open cholecystectomy many years ago, vaginal hysterectomy, and appendectomy. She did have a right breast biopsy by Dr. Truett Mainland many years ago for benign disease.   HPI  Past Medical History  Diagnosis Date  . CARDIAC ARRHYTHMIA 01/08/2010  . COLONIC POLYPS, HX OF 10/09/2006  . HYPERLIPIDEMIA 10/09/2006  . HYPERTENSION 10/09/2006  . HYPOTHYROIDISM 10/09/2006  . LASSITUDE 08/15/2009  . OSTEOPOROSIS 10/09/2006  . VERTIGO 08/07/2009    Past Surgical History  Procedure Laterality Date  . Appendectomy    . Cholecystectomy    . Abdominal hysterectomy    . Breast surgery      left - lumpectomy  . Tonsillectomy    . Cataract extraction      No family history on file.  Social History History  Substance Use  Topics  . Smoking status: Never Smoker   . Smokeless tobacco: Never Used  . Alcohol Use: No    Allergies  Allergen Reactions  . Codeine Shortness Of Breath    Difficulty breathing    Current Outpatient Prescriptions  Medication Sig Dispense Refill  . alendronate (FOSAMAX) 70 MG tablet Take 1 tablet (70 mg total) by mouth every 7 (seven) days. Take with a full glass of water on an empty stomach.  4 tablet  11  . ALPRAZolam (XANAX) 0.25 MG tablet Take 1 tablet (0.25 mg total) by mouth every 4 (four) hours as needed for sleep.  6 tablet  0  . atorvastatin (LIPITOR) 20 MG tablet Take 1 tablet (20 mg total) by mouth daily.  90 tablet  6  . calcium-vitamin D (OSCAL WITH D) 500-200 MG-UNIT per tablet Take 1 tablet by mouth daily.      Marland Kitchen levothyroxine (SYNTHROID, LEVOTHROID) 75 MCG tablet Take 1 tablet (75 mcg total) by mouth daily.  90 tablet  6  . losartan-hydrochlorothiazide (HYZAAR) 100-12.5 MG per tablet Take 1 tablet by mouth daily.  90 tablet  6  . meclizine (ANTIVERT) 25 MG tablet Take 1 tablet (25 mg total) by mouth 3 (three) times daily as needed.  30 tablet  4  . Multiple Vitamin (MULTIVITAMIN) tablet Take 1 tablet by mouth daily.      . traMADol (ULTRAM) 50 MG tablet Take 1 tablet (50 mg total) by mouth every 8 (eight) hours as  needed for pain.  60 tablet  3  . vitamin C (ASCORBIC ACID) 500 MG tablet Take 500 mg by mouth daily.      . vitamin E 400 UNIT capsule Take 800 Units by mouth daily.      Marland Kitchen zolpidem (AMBIEN) 10 MG tablet TAKE ONE TABLET BY MOUTH AT BEDTIME AS NEEDED  60 tablet  0   No current facility-administered medications for this visit.    Review of Systems Review of Systems  Constitutional: Negative for fever, chills and unexpected weight change.  HENT: Negative for hearing loss, congestion, sore throat, trouble swallowing and voice change.   Eyes: Negative for visual disturbance.  Respiratory: Negative for cough and wheezing.   Cardiovascular: Negative for chest  pain, palpitations and leg swelling.  Gastrointestinal: Negative for nausea, vomiting, abdominal pain, diarrhea, constipation, blood in stool, abdominal distention and anal bleeding.  Genitourinary: Negative for hematuria, vaginal bleeding and difficulty urinating.  Musculoskeletal: Negative for arthralgias.  Skin: Negative for rash and wound.  Neurological: Negative for seizures, syncope and headaches.  Hematological: Negative for adenopathy. Does not bruise/bleed easily.  Psychiatric/Behavioral: Negative for confusion.    Blood pressure 120/82, pulse 72, temperature 97 F (36.1 C), temperature source Oral, resp. rate 12, height 5' (1.524 m), weight 135 lb (61.236 kg).  Physical Exam Physical Exam  Constitutional: She is oriented to person, place, and time. She appears well-developed and well-nourished. No distress.  HENT:  Head: Normocephalic and atraumatic.  Nose: Nose normal.  Mouth/Throat: No oropharyngeal exudate.  Eyes: Conjunctivae and EOM are normal. Pupils are equal, round, and reactive to light. Left eye exhibits no discharge. No scleral icterus.  Neck: Neck supple. No JVD present. No tracheal deviation present. No thyromegaly present.  Cardiovascular: Normal rate, regular rhythm, normal heart sounds and intact distal pulses.   No murmur heard. Pulmonary/Chest: Effort normal and breath sounds normal. No respiratory distress. She has no wheezes. She has no rales. She exhibits no tenderness.  Breasts are atrophic. Some ecchymoses in the left breast. Vague density just outside the areolar margin a left, question whether this may be a small hematoma. No other skin changes. No other masses. No nipple discharge. No axillary adenopathy.  Abdominal: Soft. Bowel sounds are normal. She exhibits no distension and no mass. There is no tenderness. There is no rebound and no guarding.  Long right paramedian scar.  Musculoskeletal: She exhibits no edema and no tenderness.  Lymphadenopathy:     She has no cervical adenopathy.  Neurological: She is alert and oriented to person, place, and time. She exhibits normal muscle tone. Coordination normal.  Skin: Skin is warm. No rash noted. She is not diaphoretic. No erythema. No pallor.  Psychiatric: She has a normal mood and affect. Her behavior is normal. Judgment and thought content normal.    Data Reviewed I have reviewed the imaging studies, mammography reports, and the pathology report.  Assessment    LCIS left breast, 2:30 position. Conservative excision of this area is indicated to rule out occult carcinoma  Family history of breast cancer in her sister and then a second cousin.  Hypertension  Hypothyroidism  Mild anxiety.     Plan    Schedule for conservative left partial mastectomy with needle localization in the near future.  I discussed the indications, details, techniques, and numerous risks of the surgery with the patient and her daughter. They have been given patient information booklet to read. They understand all these issues and all of their questions  are answered. They agree with this plan.        Angelia Mould. Derrell Lolling, M.D., [facility] General and Minimally invasive Surgery Breast and Colorectal Surgery Office:   [phone] Pager:   [phone]  06/03/2012, 11:30 AM

## 2012-06-03 NOTE — Patient Instructions (Signed)
Your recent left breast needle biopsy shows lobular  neoplasia, also called lobular carcinoma in situ. This increases your risk of breast cancer in the future. There is also a small possibility that you may have a more  advanced form of cancer near the area of the biopsy.  Because of this and your family history of breast cancer, you will be scheduled for a left breast biopsy, also called left partial mastectomy in the near future.     Lumpectomy, Breast Conserving Surgery A lumpectomy is breast surgery that removes only part of the breast. Another name used may be partial mastectomy. The amount removed varies. Make sure you understand how much of your breast will be removed. Reasons for a lumpectomy:  Any solid breast mass.  Grouped significant nodularity that may be confused with a solitary breast mass. Lumpectomy is the most common form of breast cancer surgery today. The surgeon removes the portion of your breast which contains the tumor (cancer). This is the lump. Some normal tissue around the lump is also removed to be sure that all the tumor has been removed.  If cancer cells are found in the margins where the breast tissue was removed, your surgeon will do more surgery to remove the remaining cancer tissue. This is called re-excision surgery. Radiation and/or chemotherapy treatments are often given following a lumpectomy to kill any cancer cells that could possibly remain.  REASONS YOU MAY NOT BE ABLE TO HAVE BREAST CONSERVING SURGERY:  The tumor is located in more than one place.  Your breast is small and the tumor is large so the breast would be disfigured.  The entire tumor removal is not successful with a lumpectomy.  You cannot commit to a full course of chemotherapy, radiation therapy or are pregnant and cannot have radiation.  You have previously had radiation to the breast to treat cancer. HOW A LUMPECTOMY IS PERFORMED If overnight nursing is not required following a  biopsy, a lumpectomy can be performed as a same-day surgery. This can be done in a hospital, clinic, or surgical center. The anesthesia used will depend on your surgeon. They will discuss this with you. A general anesthetic keeps you sleeping through the procedure. LET YOUR CAREGIVERS KNOW ABOUT THE FOLLOWING:  Allergies  Medications taken including herbs, eye drops, over the counter medications, and creams.  Use of steroids (by mouth or creams)  Previous problems with anesthetics or Novocaine.  Possibility of pregnancy, if this applies  History of blood clots (thrombophlebitis)  History of bleeding or blood problems.  Previous surgery  Other health problems BEFORE THE PROCEDURE You should be present one hour prior to your procedure unless directed otherwise.  AFTER THE PROCEDURE  After surgery, you will be taken to the recovery area where a nurse will watch and check your progress. Once you're awake, stable, and taking fluids well, barring other problems you will be allowed to go home.  Ice packs applied to your operative site may help with discomfort and keep the swelling down.  A small rubber drain may be placed in the breast for a couple of days to prevent a hematoma from developing in the breast.  A pressure dressing may be applied for 24 to 48 hours to prevent bleeding.  Keep the wound dry.  You may resume a normal diet and activities as directed. Avoid strenuous activities affecting the arm on the side of the biopsy site such as tennis, swimming, heavy lifting (more than 10 pounds) or pulling.  Bruising in the breast is normal following this procedure.  Wearing a bra - even to bed - may be more comfortable and also help keep the dressing on.  Change dressings as directed.  Only take over-the-counter or prescription medicines for pain, discomfort, or fever as directed by your caregiver. Call for your results as instructed by your surgeon. Remember it is your  responsibility to get the results of your lumpectomy if your surgeon asked you to follow-up. Do not assume everything is fine if you have not heard from your caregiver. SEEK MEDICAL CARE IF:   There is increased bleeding (more than a small spot) from the wound.  You notice redness, swelling, or increasing pain in the wound.  Pus is coming from wound.  An unexplained oral temperature above 102 F (38.9 C) develops.  You notice a foul smell coming from the wound or dressing. SEEK IMMEDIATE MEDICAL CARE IF:   You develop a rash.  You have difficulty breathing.  You have any allergic problems. Document Released: 04/14/2006 Document Revised: 05/26/2011 Document Reviewed: 07/16/2006 [facility] Patient Information 2013 [place], [place].

## 2012-06-09 DIAGNOSIS — H26499 Other secondary cataract, unspecified eye: Secondary | ICD-10-CM | POA: Diagnosis not present

## 2012-06-09 DIAGNOSIS — H35369 Drusen (degenerative) of macula, unspecified eye: Secondary | ICD-10-CM | POA: Diagnosis not present

## 2012-06-09 DIAGNOSIS — H40019 Open angle with borderline findings, low risk, unspecified eye: Secondary | ICD-10-CM | POA: Diagnosis not present

## 2012-06-09 DIAGNOSIS — Z961 Presence of intraocular lens: Secondary | ICD-10-CM | POA: Diagnosis not present

## 2012-06-15 ENCOUNTER — Encounter (INDEPENDENT_AMBULATORY_CARE_PROVIDER_SITE_OTHER): Payer: Self-pay

## 2012-06-24 ENCOUNTER — Encounter (HOSPITAL_BASED_OUTPATIENT_CLINIC_OR_DEPARTMENT_OTHER): Payer: Self-pay | Admitting: *Deleted

## 2012-06-24 NOTE — H&P (Signed)
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 female.  She is referred by Dr. Rogelia Mire at [facility] health for evaluation and surgical management of a focal area of LCIS in the left breast laterally. Dr. Victoriano Lain is her primary care physician.    The patient gets regular mammograms. Recent 3-D mammograms show a focal area of new calcifications in the upper-outer quadrant of the left breast. This is a small area. There was a density in the lateral aspect of the right breast which has been stable. Stereotactic core biopsy of the left breast calcifications in the upper outer quadrant showed lobular neoplasia, LCIS. Clip was deployed it appears well positioned radiographically.   The patient is here with her daughter. They're concerned because the patient's sister had breast cancer and the patient's maternal second cousin had breast cancer. Maternal aunt may have had ovarian cancer. The patient's mother had colon cancer   The patient is in pretty good health. She has hypertension, hyperlipidemia, mild anxiety on Xanax, hypothyroidism. She's had an open cholecystectomy many years ago, vaginal hysterectomy, and appendectomy. She did have a right breast biopsy by Dr. Truett Mainland many years ago for benign disease.      Past Medical History   Diagnosis  Date   .  CARDIAC ARRHYTHMIA  01/08/2010   .  COLONIC POLYPS, HX OF  10/09/2006   .  HYPERLIPIDEMIA  10/09/2006   .  HYPERTENSION  10/09/2006   .  HYPOTHYROIDISM  10/09/2006    .  LASSITUDE  08/15/2009   .  OSTEOPOROSIS  10/09/2006   .  VERTIGO  08/07/2009         Past Surgical History   Procedure  Laterality  Date   .  Appendectomy       .  Cholecystectomy       .  Abdominal hysterectomy       .  Breast surgery           left - lumpectomy   .  Tonsillectomy       .  Cataract extraction            No family history on file.   Social History History   Substance Use Topics   .  Smoking status:  Never Smoker    .  Smokeless tobacco:  Never Used   .  Alcohol Use:  No         Allergies   Allergen  Reactions   .  Codeine  Shortness Of Breath       Difficulty breathing         Current Outpatient Prescriptions  Medication  Sig  Dispense  Refill   .  alendronate (FOSAMAX) 70 MG tablet  Take 1 tablet (70 mg total) by mouth every 7 (seven) days. Take with a full glass of water on an empty stomach.   4 tablet   11   .  ALPRAZolam (XANAX) 0.25 MG tablet  Take 1 tablet (0.25 mg total) by mouth every 4 (four) hours as needed for sleep.   6 tablet   0   .  atorvastatin (LIPITOR) 20 MG tablet  Take 1 tablet (20 mg total) by mouth daily.   90 tablet   6   .  calcium-vitamin D (OSCAL WITH D) 500-200 MG-UNIT per tablet  Take 1 tablet by mouth daily.         Marland Kitchen  levothyroxine (SYNTHROID, LEVOTHROID) 75 MCG tablet  Take 1 tablet (75 mcg total) by mouth daily.   90 tablet   6   .  losartan-hydrochlorothiazide (HYZAAR) 100-12.5 MG per tablet  Take 1 tablet by mouth daily.   90 tablet   6   .  meclizine (ANTIVERT) 25 MG tablet  Take 1 tablet (25 mg total) by mouth 3 (three) times daily as needed.   30 tablet   4   .  Multiple Vitamin (MULTIVITAMIN) tablet  Take 1 tablet by mouth daily.         .  traMADol (ULTRAM) 50 MG tablet  Take 1 tablet (50 mg total) by mouth every 8 (eight) hours as needed for pain.   60 tablet   3   .  vitamin C (ASCORBIC ACID) 500 MG tablet  Take 500 mg by mouth daily.         .  vitamin E 400 UNIT capsule  Take 800 Units by mouth daily.          Marland Kitchen  zolpidem (AMBIEN) 10 MG tablet  TAKE ONE TABLET BY MOUTH AT BEDTIME AS NEEDED   60 tablet   0       No current facility-administered medications for this visit.        Review of Systems   Constitutional: Negative for fever, chills and unexpected weight change.  HENT: Negative for hearing loss, congestion, sore throat, trouble swallowing and voice change.   Eyes: Negative for visual disturbance.  Respiratory: Negative for cough and wheezing.   Cardiovascular: Negative for chest pain, palpitations and leg swelling.  Gastrointestinal: Negative for nausea, vomiting, abdominal pain, diarrhea, constipation, blood in stool, abdominal distention and anal bleeding.  Genitourinary: Negative for hematuria, vaginal bleeding and difficulty urinating.  Musculoskeletal: Negative for arthralgias.  Skin: Negative for rash and wound.  Neurological: Negative for seizures, syncope and headaches.  Hematological: Negative for adenopathy. Does not bruise/bleed easily.  Psychiatric/Behavioral: Negative for confusion.      Blood pressure 120/82, pulse 72, temperature 97 F (36.1 C), temperature source Oral, resp. rate 12, height 5' (1.524 m), weight 135 lb (61.236 kg).   Physical Exam  Constitutional: She is oriented to person, place, and time. She appears well-developed and well-nourished. No distress.  HENT:   Head: Normocephalic and atraumatic.   Nose: Nose normal.   Mouth/Throat: No oropharyngeal exudate.  Eyes: Conjunctivae and EOM are normal. Pupils are equal, round, and reactive to light. Left eye exhibits no discharge. No scleral icterus.  Neck: Neck supple. No JVD present. No tracheal deviation present. No thyromegaly present.  Cardiovascular: Normal rate, regular rhythm, normal heart sounds and intact distal pulses.    No  murmur heard. Pulmonary/Chest: Effort normal and breath sounds normal. No respiratory distress. She has no wheezes. She has no rales. She exhibits no  tenderness.  Breasts are atrophic. Some ecchymoses in the left breast. Vague density just outside the areolar margin a left, question whether this may be a small hematoma. No other skin changes. No other masses. No nipple discharge. No axillary adenopathy.  Abdominal: Soft. Bowel sounds are normal. She exhibits no distension and no mass. There is no tenderness. There is no rebound and no guarding.  Long right paramedian scar.  Musculoskeletal: She exhibits no edema and no tenderness.  Lymphadenopathy:    She has no cervical adenopathy.  Neurological: She is alert and oriented to person, place, and time. She exhibits normal muscle tone. Coordination normal.  Skin: Skin is warm. No rash noted. She is not diaphoretic. No erythema. No pallor.  Psychiatric: She has a normal mood and affect. Her behavior is normal. Judgment and thought content normal.      Data Reviewed I have reviewed the imaging studies, mammography reports, and the pathology report.   Assessment    LCIS left breast, 2:30 position. Conservative excision of this area is indicated to rule out occult carcinoma   Family history of breast cancer in her sister and then a second cousin.   Hypertension   Hypothyroidism   Mild anxiety.      Plan    Schedule for conservative left partial mastectomy with needle localization in the near future.   I discussed the indications, details, techniques, and numerous risks of the surgery with the patient and her daughter. They have been given patient information booklet to read. They understand all these issues and all of their questions are answered. They agree with this plan.           Angelia Mould. Derrell Lolling, M.D., [facility] General and Minimally invasive Surgery Breast and Colorectal Surgery Office:   [phone] Pager:   [phone]

## 2012-06-24 NOTE — Progress Notes (Signed)
Pt alble to take care of self-drives-able to answer questions [place] Spaniel she had a heart flutter and [place] [facility]-never found anything will try to get notes-sees dr Kirtland Bouchard pcp-[place]- To come in for ekg and bmet

## 2012-06-28 ENCOUNTER — Encounter (HOSPITAL_BASED_OUTPATIENT_CLINIC_OR_DEPARTMENT_OTHER)
Admission: RE | Admit: 2012-06-28 | Discharge: 2012-06-28 | Disposition: A | Discharge status: Home / Self Care | Payer: Medicare Other | Source: Ambulatory Visit | Attending: General Surgery | Admitting: General Surgery

## 2012-06-28 DIAGNOSIS — F411 Generalized anxiety disorder: Secondary | ICD-10-CM | POA: Diagnosis not present

## 2012-06-28 DIAGNOSIS — D0590 Unspecified type of carcinoma in situ of unspecified breast: Secondary | ICD-10-CM | POA: Diagnosis not present

## 2012-06-28 DIAGNOSIS — Z90710 Acquired absence of both cervix and uterus: Secondary | ICD-10-CM | POA: Diagnosis not present

## 2012-06-28 DIAGNOSIS — Z885 Allergy status to narcotic agent status: Secondary | ICD-10-CM | POA: Diagnosis not present

## 2012-06-28 DIAGNOSIS — Z803 Family history of malignant neoplasm of breast: Secondary | ICD-10-CM | POA: Diagnosis not present

## 2012-06-28 DIAGNOSIS — Z800 Family history of malignant neoplasm of digestive organs: Secondary | ICD-10-CM | POA: Diagnosis not present

## 2012-06-28 DIAGNOSIS — M810 Age-related osteoporosis without current pathological fracture: Secondary | ICD-10-CM | POA: Diagnosis not present

## 2012-06-28 DIAGNOSIS — Z79899 Other long term (current) drug therapy: Secondary | ICD-10-CM | POA: Diagnosis not present

## 2012-06-28 DIAGNOSIS — Z86010 Personal history of colonic polyps: Secondary | ICD-10-CM | POA: Diagnosis not present

## 2012-06-28 DIAGNOSIS — I10 Essential (primary) hypertension: Secondary | ICD-10-CM | POA: Diagnosis not present

## 2012-06-28 DIAGNOSIS — E039 Hypothyroidism, unspecified: Secondary | ICD-10-CM | POA: Diagnosis not present

## 2012-06-28 DIAGNOSIS — I498 Other specified cardiac arrhythmias: Secondary | ICD-10-CM | POA: Diagnosis not present

## 2012-06-28 DIAGNOSIS — E785 Hyperlipidemia, unspecified: Secondary | ICD-10-CM | POA: Diagnosis not present

## 2012-06-28 LAB — BASIC METABOLIC PANEL
CO2: 31 mEq/L (ref 19–32)
Chloride: 104 mEq/L (ref 96–112)
GFR calc Af Amer: 70 mL/min — ABNORMAL LOW (ref >90)
Sodium: 141 mEq/L (ref 135–145)

## 2012-06-29 ENCOUNTER — Ambulatory Visit (HOSPITAL_BASED_OUTPATIENT_CLINIC_OR_DEPARTMENT_OTHER)
Admission: RE | Admit: 2012-06-29 | Discharge: 2012-06-29 | Disposition: A | Discharge status: Home / Self Care | Payer: Medicare Other | Source: Ambulatory Visit | Attending: General Surgery | Admitting: General Surgery

## 2012-06-29 ENCOUNTER — Encounter (HOSPITAL_BASED_OUTPATIENT_CLINIC_OR_DEPARTMENT_OTHER): Payer: Self-pay | Admitting: *Deleted

## 2012-06-29 ENCOUNTER — Ambulatory Visit (HOSPITAL_BASED_OUTPATIENT_CLINIC_OR_DEPARTMENT_OTHER): Payer: Medicare Other | Admitting: *Deleted

## 2012-06-29 ENCOUNTER — Encounter (HOSPITAL_BASED_OUTPATIENT_CLINIC_OR_DEPARTMENT_OTHER)
Admission: RE | Disposition: A | Discharge status: Home / Self Care | Payer: Self-pay | Source: Ambulatory Visit | Attending: General Surgery

## 2012-06-29 DIAGNOSIS — D0590 Unspecified type of carcinoma in situ of unspecified breast: Secondary | ICD-10-CM | POA: Diagnosis not present

## 2012-06-29 DIAGNOSIS — E785 Hyperlipidemia, unspecified: Secondary | ICD-10-CM | POA: Insufficient documentation

## 2012-06-29 DIAGNOSIS — Z79899 Other long term (current) drug therapy: Secondary | ICD-10-CM | POA: Insufficient documentation

## 2012-06-29 DIAGNOSIS — Z0189 Encounter for other specified special examinations: Secondary | ICD-10-CM | POA: Diagnosis not present

## 2012-06-29 DIAGNOSIS — M810 Age-related osteoporosis without current pathological fracture: Secondary | ICD-10-CM | POA: Insufficient documentation

## 2012-06-29 DIAGNOSIS — D4860 Neoplasm of uncertain behavior of unspecified breast: Secondary | ICD-10-CM

## 2012-06-29 DIAGNOSIS — Z803 Family history of malignant neoplasm of breast: Secondary | ICD-10-CM | POA: Insufficient documentation

## 2012-06-29 DIAGNOSIS — F411 Generalized anxiety disorder: Secondary | ICD-10-CM | POA: Insufficient documentation

## 2012-06-29 DIAGNOSIS — Z800 Family history of malignant neoplasm of digestive organs: Secondary | ICD-10-CM | POA: Insufficient documentation

## 2012-06-29 DIAGNOSIS — I498 Other specified cardiac arrhythmias: Secondary | ICD-10-CM | POA: Insufficient documentation

## 2012-06-29 DIAGNOSIS — Z90710 Acquired absence of both cervix and uterus: Secondary | ICD-10-CM | POA: Insufficient documentation

## 2012-06-29 DIAGNOSIS — Z885 Allergy status to narcotic agent status: Secondary | ICD-10-CM | POA: Insufficient documentation

## 2012-06-29 DIAGNOSIS — D0502 Lobular carcinoma in situ of left breast: Secondary | ICD-10-CM | POA: Diagnosis present

## 2012-06-29 DIAGNOSIS — I10 Essential (primary) hypertension: Secondary | ICD-10-CM | POA: Insufficient documentation

## 2012-06-29 DIAGNOSIS — E039 Hypothyroidism, unspecified: Secondary | ICD-10-CM | POA: Insufficient documentation

## 2012-06-29 DIAGNOSIS — Z860100 Personal history of colon polyps, unspecified: Secondary | ICD-10-CM | POA: Insufficient documentation

## 2012-06-29 DIAGNOSIS — C50919 Malignant neoplasm of unspecified site of unspecified female breast: Secondary | ICD-10-CM | POA: Diagnosis not present

## 2012-06-29 HISTORY — DX: Presence of external hearing-aid: Z97.4

## 2012-06-29 HISTORY — DX: Presence of spectacles and contact lenses: Z97.3

## 2012-06-29 HISTORY — PX: PARTIAL MASTECTOMY WITH NEEDLE LOCALIZATION: SHX6008

## 2012-06-29 LAB — POCT HEMOGLOBIN-HEMACUE: Hemoglobin: 11.9 g/dL — ABNORMAL LOW (ref 12.0–15.0)

## 2012-06-29 SURGERY — PARTIAL MASTECTOMY WITH NEEDLE LOCALIZATION
Anesthesia: General | Site: Breast | Laterality: Left | Wound class: Clean

## 2012-06-29 MED ORDER — DEXAMETHASONE SODIUM PHOSPHATE 4 MG/ML IJ SOLN
INTRAMUSCULAR | Status: DC | PRN
  Administered 2012-06-29: 4 mg via INTRAVENOUS

## 2012-06-29 MED ORDER — ACETAMINOPHEN 650 MG RE SUPP: 650.0000 mg | RECTAL | Status: DC | PRN

## 2012-06-29 MED ORDER — ACETAMINOPHEN 325 MG PO TABS: 650.0000 mg | ORAL_TABLET | ORAL | Status: DC | PRN

## 2012-06-29 MED ORDER — OXYCODONE HCL 5 MG PO TABS: 5.0000 mg | ORAL_TABLET | Freq: Once | ORAL | Status: DC | PRN

## 2012-06-29 MED ORDER — ONDANSETRON HCL 4 MG/2ML IJ SOLN: 4.0000 mg | Freq: Four times a day (QID) | INTRAMUSCULAR | Status: DC | PRN

## 2012-06-29 MED ORDER — PROMETHAZINE HCL 25 MG/ML IJ SOLN: 6.2500 mg | INTRAMUSCULAR | Status: DC | PRN

## 2012-06-29 MED ORDER — PROPOFOL 10 MG/ML IV BOLUS
INTRAVENOUS | Status: DC | PRN
  Administered 2012-06-29: 120 mg via INTRAVENOUS

## 2012-06-29 MED ORDER — ONDANSETRON HCL 4 MG/2ML IJ SOLN
INTRAMUSCULAR | Status: DC | PRN
  Administered 2012-06-29: 4 mg via INTRAVENOUS

## 2012-06-29 MED ORDER — MORPHINE SULFATE 2 MG/ML IJ SOLN: 1.0000 mg | INTRAMUSCULAR | Status: DC | PRN

## 2012-06-29 MED ORDER — CEFAZOLIN SODIUM-DEXTROSE 2-3 GM-% IV SOLR
2.0000 g | INTRAVENOUS | Status: AC
  Administered 2012-06-29: 2 g via INTRAVENOUS

## 2012-06-29 MED ORDER — SODIUM CHLORIDE 0.9 % IJ SOLN: 3.0000 mL | Freq: Two times a day (BID) | INTRAMUSCULAR | Status: DC

## 2012-06-29 MED ORDER — SODIUM CHLORIDE 0.9 % IJ SOLN: 3.0000 mL | INTRAMUSCULAR | Status: DC | PRN

## 2012-06-29 MED ORDER — MIDAZOLAM HCL 2 MG/2ML IJ SOLN: 0.5000 mg | Freq: Once | INTRAMUSCULAR | Status: DC | PRN

## 2012-06-29 MED ORDER — SODIUM CHLORIDE 0.9 % IV SOLN: INTRAVENOUS | Status: DC

## 2012-06-29 MED ORDER — OXYCODONE HCL 5 MG/5ML PO SOLN: 5.0000 mg | Freq: Once | ORAL | Status: DC | PRN

## 2012-06-29 MED ORDER — FENTANYL CITRATE 0.05 MG/ML IJ SOLN
INTRAMUSCULAR | Status: DC | PRN
  Administered 2012-06-29: 100 ug via INTRAVENOUS

## 2012-06-29 MED ORDER — LACTATED RINGERS IV SOLN
INTRAVENOUS | Status: DC
  Administered 2012-06-29: 12:00:00 via INTRAVENOUS

## 2012-06-29 MED ORDER — OXYCODONE HCL 5 MG PO TABS: 5.0000 mg | ORAL_TABLET | ORAL | Status: DC | PRN

## 2012-06-29 MED ORDER — FENTANYL CITRATE 0.05 MG/ML IJ SOLN
25.0000 ug | INTRAMUSCULAR | Status: DC | PRN
  Administered 2012-06-29 (×2): 25 ug via INTRAVENOUS

## 2012-06-29 MED ORDER — BUPIVACAINE-EPINEPHRINE 0.5% -1:200000 IJ SOLN
INTRAMUSCULAR | Status: DC | PRN
  Administered 2012-06-29: 10 mL

## 2012-06-29 MED ORDER — HYDROCODONE-ACETAMINOPHEN 5-325 MG PO TABS: 1.0000 | ORAL_TABLET | ORAL | Status: DC | PRN

## 2012-06-29 MED ORDER — MEPERIDINE HCL 25 MG/ML IJ SOLN: 6.2500 mg | INTRAMUSCULAR | Status: DC | PRN

## 2012-06-29 MED ORDER — EPHEDRINE SULFATE 50 MG/ML IJ SOLN
INTRAMUSCULAR | Status: DC | PRN
  Administered 2012-06-29: 5 mg via INTRAVENOUS

## 2012-06-29 MED ORDER — MIDAZOLAM HCL 2 MG/2ML IJ SOLN: 1.0000 mg | INTRAMUSCULAR | Status: DC | PRN

## 2012-06-29 MED ORDER — SODIUM CHLORIDE 0.9 % IV SOLN: 250.0000 mL | INTRAVENOUS | Status: DC | PRN

## 2012-06-29 MED ORDER — CHLORHEXIDINE GLUCONATE 4 % EX LIQD: 1.0000 "application " | Freq: Once | CUTANEOUS | Status: DC

## 2012-06-29 MED ORDER — ACETAMINOPHEN 10 MG/ML IV SOLN
1000.0000 mg | Freq: Once | INTRAVENOUS | Status: AC
  Administered 2012-06-29: 1000 mg via INTRAVENOUS

## 2012-06-29 MED ORDER — FENTANYL CITRATE 0.05 MG/ML IJ SOLN: 50.0000 ug | INTRAMUSCULAR | Status: DC | PRN

## 2012-06-29 MED ORDER — LIDOCAINE HCL (CARDIAC) 20 MG/ML IV SOLN
INTRAVENOUS | Status: DC | PRN
  Administered 2012-06-29: 40 mg via INTRAVENOUS

## 2012-06-29 SURGICAL SUPPLY — 55 items
APPLIER CLIP 9.375 MED OPEN (MISCELLANEOUS)
BANDAGE ELASTIC 6 VELCRO ST LF (GAUZE/BANDAGES/DRESSINGS) IMPLANT
BENZOIN TINCTURE PRP APPL 2/3 (GAUZE/BANDAGES/DRESSINGS) IMPLANT
BLADE HEX COATED 2.75 (ELECTRODE) ×2 IMPLANT
BLADE SURG 15 STRL LF DISP TIS (BLADE) ×1 IMPLANT
BLADE SURG 15 STRL SS (BLADE) ×1
CANISTER SUCTION 1200CC (MISCELLANEOUS) ×2 IMPLANT
CHLORAPREP W/TINT 26ML (MISCELLANEOUS) ×2 IMPLANT
CLIP APPLIE 9.375 MED OPEN (MISCELLANEOUS) IMPLANT
CLOTH BEACON ORANGE TIMEOUT ST (SAFETY) ×2 IMPLANT
COVER MAYO STAND STRL (DRAPES) ×2 IMPLANT
COVER TABLE BACK 60X90 (DRAPES) ×2 IMPLANT
DECANTER SPIKE VIAL GLASS SM (MISCELLANEOUS) IMPLANT
DERMABOND ADVANCED (GAUZE/BANDAGES/DRESSINGS) ×1
DERMABOND ADVANCED .7 DNX12 (GAUZE/BANDAGES/DRESSINGS) ×1 IMPLANT
DEVICE DUBIN W/COMP PLATE 8390 (MISCELLANEOUS) ×2 IMPLANT
DRAPE LAPAROSCOPIC ABDOMINAL (DRAPES) IMPLANT
DRAPE LAPAROTOMY TRNSV 102X78 (DRAPE) IMPLANT
DRAPE PED LAPAROTOMY (DRAPES) ×2 IMPLANT
DRAPE UTILITY XL STRL (DRAPES) ×2 IMPLANT
ELECT REM PT RETURN 9FT ADLT (ELECTROSURGICAL) ×2
ELECTRODE REM PT RTRN 9FT ADLT (ELECTROSURGICAL) ×1 IMPLANT
GAUZE SPONGE 4X4 12PLY STRL LF (GAUZE/BANDAGES/DRESSINGS) IMPLANT
GAUZE SPONGE 4X4 16PLY XRAY LF (GAUZE/BANDAGES/DRESSINGS) IMPLANT
GLOVE BIOGEL PI IND STRL 7.0 (GLOVE) ×1 IMPLANT
GLOVE BIOGEL PI INDICATOR 7.0 (GLOVE) ×1
GLOVE ECLIPSE 7.0 STRL STRAW (GLOVE) ×2 IMPLANT
GLOVE EUDERMIC 7 POWDERFREE (GLOVE) ×2 IMPLANT
GLOVE SKINSENSE NS SZ7.0 (GLOVE) ×1
GLOVE SKINSENSE STRL SZ7.0 (GLOVE) ×1 IMPLANT
GOWN PREVENTION PLUS XLARGE (GOWN DISPOSABLE) ×4 IMPLANT
GOWN PREVENTION PLUS XXLARGE (GOWN DISPOSABLE) ×2 IMPLANT
KIT MARKER MARGIN INK (KITS) ×2 IMPLANT
NEEDLE HYPO 22GX1.5 SAFETY (NEEDLE) IMPLANT
NEEDLE HYPO 25X1 1.5 SAFETY (NEEDLE) ×2 IMPLANT
NS IRRIG 1000ML POUR BTL (IV SOLUTION) ×2 IMPLANT
PACK BASIN DAY SURGERY FS (CUSTOM PROCEDURE TRAY) ×2 IMPLANT
PENCIL BUTTON HOLSTER BLD 10FT (ELECTRODE) ×2 IMPLANT
SLEEVE SCD COMPRESS KNEE MED (MISCELLANEOUS) ×2 IMPLANT
SPONGE LAP 4X18 X RAY DECT (DISPOSABLE) ×2 IMPLANT
STRIP CLOSURE SKIN 1/2X4 (GAUZE/BANDAGES/DRESSINGS) IMPLANT
SUT ETHILON 4 0 PS 2 18 (SUTURE) IMPLANT
SUT MNCRL AB 4-0 PS2 18 (SUTURE) ×2 IMPLANT
SUT SILK 2 0 SH (SUTURE) ×2 IMPLANT
SUT VIC AB 2-0 SH 27 (SUTURE)
SUT VIC AB 2-0 SH 27XBRD (SUTURE) IMPLANT
SUT VIC AB 4-0 P-3 18XBRD (SUTURE) IMPLANT
SUT VIC AB 4-0 P3 18 (SUTURE)
SUT VICRYL 3-0 CR8 SH (SUTURE) ×2 IMPLANT
SYR BULB 3OZ (MISCELLANEOUS) IMPLANT
SYR CONTROL 10ML LL (SYRINGE) ×2 IMPLANT
TAPE HYPAFIX 4 X10 (GAUZE/BANDAGES/DRESSINGS) IMPLANT
TOWEL OR NON WOVEN STRL DISP B (DISPOSABLE) IMPLANT
TUBE CONNECTING 20X1/4 (TUBING) ×2 IMPLANT
YANKAUER SUCT BULB TIP NO VENT (SUCTIONS) ×2 IMPLANT

## 2012-06-29 NOTE — Anesthesia Preprocedure Evaluation (Addendum)
Anesthesia Evaluation  Patient identified by MRN, date of birth, ID band Patient awake    Reviewed: Allergy & Precautions, H&P , NPO status , Patient's Chart, lab work & pertinent test results  History of Anesthesia Complications Negative for: history of anesthetic complications  Airway Mallampati: II TM Distance: >3 FB Neck ROM: Full    Dental  (+) Teeth Intact and Dental Advisory Given   Pulmonary neg pulmonary ROS,  breath sounds clear to auscultation  Pulmonary exam normal       Cardiovascular hypertension, Pt. on medications Rhythm:Regular Rate:Normal     Neuro/Psych negative neurological ROS     GI/Hepatic negative GI ROS, Neg liver ROS,   Endo/Other  Hypothyroidism   Renal/GU negative Renal ROS     Musculoskeletal   Abdominal   Peds  Hematology negative hematology ROS (+)   Anesthesia Other Findings   Reproductive/Obstetrics                           Anesthesia Physical Anesthesia Plan  ASA: II  Anesthesia Plan: General   Post-op Pain Management:    Induction: Intravenous  Airway Management Planned: LMA  Additional Equipment:   Intra-op Plan:   Post-operative Plan:   Informed Consent: I have reviewed the patients History and Physical, chart, labs and discussed the procedure including the risks, benefits and alternatives for the proposed anesthesia with the patient or authorized representative who has indicated his/her understanding and acceptance.   Dental advisory given  Plan Discussed with: CRNA and Surgeon  Anesthesia Plan Comments: (Plan routine monitors, GA- LMA OK)        Anesthesia Quick Evaluation

## 2012-06-29 NOTE — Op Note (Signed)
Patient Name:           Christine Perez   Date of Surgery:        06/29/2012  Pre op Diagnosis:      Lobular carcinoma in situ left breast, 3:00 position  Post op Diagnosis:    Same  Procedure:                 Left partial mastectomy with needle localization and margin assessment  Surgeon:                     Angelia Mould. Derrell Lolling, M.D., FACS  Assistant:                      None  Operative Indications:   Christine Perez is a 79 y.o. female. She is referred by Dr. Rogelia Mire at [facility] health for evaluation and surgical management of a focal area of LCIS in the left breast laterally. Dr. Victoriano Lain is her primary care physician.  The patient gets regular mammograms. Recent 3-D mammograms show a focal area of new calcifications in the upper-outer quadrant of the left breast. This is a small area. There was a density in the lateral aspect of the right breast which has been stable. Stereotactic core biopsy of the left breast calcifications in the upper outer quadrant showed lobular neoplasia, LCIS. Clip was deployed it appears well positioned radiographically.  The patient and her daughter are very  concerned because the patient's sister had breast cancer and the patient's maternal second cousin had breast cancer. Maternal aunt may have had ovarian cancer. The patient's mother had colon cancer. Because of these concerns, we agreed to conservatively excise this area. The patient is in pretty good health. She has hypertension, hyperlipidemia, mild anxiety on Xanax, hypothyroidism. She's had an open cholecystectomy many years ago, vaginal hysterectomy, and appendectomy. She did have a right breast biopsy  many years ago for benign disease.   Operative Findings:       There is an old scar in the left breast inferiorly presumably from previous biopsy. The wire and marker clip were in the left breast at the 3:00 position, and the excision was performed through a radially oriented incision.  The specimen mammogram looked good with the wire and density and marker clip contained within the specimen.  Procedure in Detail:          The wire localization was performed at [facility] and the wire was well placed. The patient was brought [facility] and general anesthesia with LMA device was induced. Surgical time out was performed. The left breast was prepped and draped in a sterile fashion. Intravenous antibiotics were given. 0.5% Marcaine with epinephrine was used as local infiltration anesthetic. A transverse, radially oriented incision was made in the left breast at the 3:00 position, going directly through the insertion site of the wire. Dissection was carried down into the breast tissue and conservatively around the localizing wire. The specimen was removed and marked with silk sutures and the 6-color ink kit.   Specimen mammogram looked good to me and the radiologist and the specimen was marked and sent to pathology. Hemostasis was excellent and achieved with electrocautery. The wound was irrigated with saline. The deeper breast tissues were closed with interrupted sutures of 3-0 Vicryl and the skin closed with a running subcuticular suture of 4-0 Monocryl and Dermabond. The patient tolerated  the procedure well. EBL 10 cc. Counts correct. Complications none. Taken to recovery room stable.     Angelia Mould. Derrell Lolling, M.D., FACS General and Minimally Invasive Surgery Breast and Colorectal Surgery  06/29/2012 1:03 PM

## 2012-06-29 NOTE — Anesthesia Procedure Notes (Signed)
Procedure Name: Intubation Date/Time: 06/29/2012 12:31 PM Performed by: Meyer Russel Pre-anesthesia Checklist: Patient identified, Emergency Drugs available, Suction available and Patient being monitored Patient Re-evaluated:Patient Re-evaluated prior to inductionOxygen Delivery Method: Circle System Utilized Preoxygenation: Pre-oxygenation with 100% oxygen Intubation Type: IV induction Ventilation: Mask ventilation without difficulty LMA: LMA inserted LMA Size: 4.0 Tube type: Oral Number of attempts: 2 (#3 LMA inserted easily but unable to seal.  Replaced with #4 LMA easily.) Airway Equipment and Method: stylet and oral airway Placement Confirmation: ETT inserted through vocal cords under direct vision,  positive ETCO2 and breath sounds checked- equal and bilateral Tube secured with: Tape Dental Injury: Teeth and Oropharynx as per pre-operative assessment

## 2012-06-29 NOTE — Preoperative (Signed)
Beta Blockers   Reason not to administer Beta Blockers:Not Applicable 

## 2012-06-29 NOTE — Interval H&P Note (Signed)
History and Physical Interval Note:  06/29/2012 11:56 AM  Christine Perez  has presented today for surgery, with the diagnosis of left breast LCIS   The goals and the  various methods of treatment have been discussed with the patient and family. After consideration of risks, benefits and other options for treatment, the patient has consented to  Procedure(s): PARTIAL MASTECTOMY WITH NEEDLE LOCALIZATION (Left) as a surgical intervention .  The patient's history has been reviewed, patient examined, no change in status, stable for surgery.  I have reviewed the patient's chart and labs.  Questions were answered to the patient's satisfaction.     [name]

## 2012-06-29 NOTE — Transfer of Care (Signed)
Immediate Anesthesia Transfer of Care Note  Patient: Christine Perez  Procedure(s) Performed: Procedure(s): PARTIAL MASTECTOMY WITH NEEDLE LOCALIZATION (Left)  Patient Location: PACU  Anesthesia Type:General  Level of Consciousness: awake, alert  and oriented  Airway & Oxygen Therapy: Patient Spontanous Breathing and Patient connected to face mask oxygen  Post-op Assessment: Report given to PACU RN, Post -op Vital signs reviewed and stable and Patient moving all extremities  Post vital signs: Reviewed and stable  Complications: No apparent anesthesia complications

## 2012-06-29 NOTE — Progress Notes (Signed)
Prescription called to [facility] as per pt. request- Norco 5/325 1-2 tablets by mouth every 4 hours as needed for pain-#25-1 refill. Prescription to be filled as per pharmacist. Prescription shredded-witnessed by Leticia Clas, RN.

## 2012-06-29 NOTE — Anesthesia Postprocedure Evaluation (Signed)
  Anesthesia Post-op Note  Patient: Christine Perez  Procedure(s) Performed: Procedure(s): PARTIAL MASTECTOMY WITH NEEDLE LOCALIZATION (Left)  Patient Location: PACU  Anesthesia Type:General  Level of Consciousness: awake, alert , oriented and patient cooperative  Airway and Oxygen Therapy: Patient Spontanous Breathing  Post-op Pain: none  Post-op Assessment: Post-op Vital signs reviewed, Patient's Cardiovascular Status Stable, Respiratory Function Stable, Patent Airway, No signs of Nausea or vomiting and Pain level controlled  Post-op Vital Signs: Reviewed and stable  Complications: No apparent anesthesia complications

## 2012-06-30 ENCOUNTER — Encounter (HOSPITAL_BASED_OUTPATIENT_CLINIC_OR_DEPARTMENT_OTHER): Payer: Self-pay | Admitting: General Surgery

## 2012-07-01 ENCOUNTER — Telehealth (INDEPENDENT_AMBULATORY_CARE_PROVIDER_SITE_OTHER): Payer: Self-pay | Admitting: General Surgery

## 2012-07-01 NOTE — Telephone Encounter (Signed)
Patient called status post partial mastectomy on 06/29/2012. She states on the paperwork she was given at discharge it says to call if she has weight gain. She wanted to let Dr Derrell Lolling know she has gained two pounds since her surgery. She is having no swelling in her feet/hands but states she does feel a little swollen in her abdomen. She is moving her bowels and passing gas. She is having no problems urinating. She is having no nausea and vomiting and she is feeling over all well. I advised it could be a difference in scales since she is comparing her home weight to her weight at the surgery center or fluid that hasn't come off her body yet. I told her I would make Dr Derrell Lolling aware and we would call her if he has concerns. She asked that we call her daughter, Olegario Messier, if needed. [phone].

## 2012-07-01 NOTE — Telephone Encounter (Signed)
Dr Derrell Lolling is aware and states it is nothing to worry about.

## 2012-07-05 ENCOUNTER — Encounter (INDEPENDENT_AMBULATORY_CARE_PROVIDER_SITE_OTHER): Payer: Self-pay

## 2012-07-05 ENCOUNTER — Telehealth (INDEPENDENT_AMBULATORY_CARE_PROVIDER_SITE_OTHER): Payer: Self-pay | Admitting: General Surgery

## 2012-07-05 NOTE — Telephone Encounter (Signed)
Pathology report shows atypical lobular neoplasia or lobular carcinoma in situ. This appears to be completely excised. I discussed this with Dr. Frederica Kuster.. I called the patient and discussed this with her. I told her that no further surgery was indicated at this time. She will see me in the office next week. We will consider referral to the high-risk breast clinic.   Christine Perez. Derrell Lolling, M.D., [facility] General and Minimally invasive Surgery Breast and Colorectal Surgery Office:   [phone] Pager:   [phone]

## 2012-07-07 ENCOUNTER — Encounter (INDEPENDENT_AMBULATORY_CARE_PROVIDER_SITE_OTHER): Payer: Self-pay

## 2012-07-12 ENCOUNTER — Ambulatory Visit (INDEPENDENT_AMBULATORY_CARE_PROVIDER_SITE_OTHER): Payer: Medicare Other | Admitting: General Surgery

## 2012-07-12 ENCOUNTER — Encounter (INDEPENDENT_AMBULATORY_CARE_PROVIDER_SITE_OTHER): Payer: Self-pay | Admitting: General Surgery

## 2012-07-12 VITALS — BP 118/70 | HR 101 | Temp 96.7°F | Ht 60.0 in | Wt 134.2 lb

## 2012-07-12 DIAGNOSIS — D0590 Unspecified type of carcinoma in situ of unspecified breast: Secondary | ICD-10-CM

## 2012-07-12 DIAGNOSIS — D0502 Lobular carcinoma in situ of left breast: Secondary | ICD-10-CM

## 2012-07-12 NOTE — Progress Notes (Signed)
Patient ID: Christine Perez, female   DOB: 1925/04/24, 79 y.o.   MRN: [id number] History: This patient recently underwent left partial mastectomy with needle localization. She has no problems with wound healing other than a little bit of soreness. The final pathology report shows lobular carcinoma in situ. I discussed this with the patient and her daughter at length today. We talked about how this affects her risk of breast cancer in the future. We also talked about the family history of breast cancer in the patient's sister and second cousin and the fact that the maternal aunt had ovarian cancer.  Exam:  Patient looks well. No distress. Daughter is with her. Left breast shows radially oriented incision at 3:00 position. Healing well. Tissues are soft. No hematoma or infection. No drainage.  Assessment: Lobular carcinoma in situ left breast, 3 oclock position. Recovering uneventfully  following left partial mastectomy with needle localization Significant family history of breast cancer and ovarian cancer  Plan: Wound care discussed. We talked about risk both for the patient and her family. They wish  further information and would like to be referred to the high breast clinic.  The patient will be referred to the high-risk breast clinic. I explained that they would decide whether genetic counseling and testing are indicated at this time I strongly urged annual mammography and annual breast exam with her primary care physician Return to see me if further problems arise.    Angelia Mould. Derrell Lolling, M.D., [facility] General and Minimally invasive Surgery Breast and Colorectal Surgery Office:   [phone] Pager:   [phone]

## 2012-07-12 NOTE — Patient Instructions (Signed)
The recent left breast lumpectomy appears to be healing very well and there are no signs of any surgical complication.  The final pathology report shows lobular carcinoma in situ. This is not a cancer that will require any treatment, but it is a sign of high risk for the future.  It is important that you get annual mammograms and that you have your  primary care physician perform an annual breast exam.  We talked about your risk and the family's risk and the history of breast cancer in the family. You will be referred to the high risk breast clinic at the [facility]. They will discuss your risk with you and will decide whether genetic testing is in order.  Return to see Dr. Derrell Lolling if further problems arise.

## 2012-07-14 ENCOUNTER — Encounter: Payer: Self-pay | Admitting: Internal Medicine

## 2012-07-14 ENCOUNTER — Ambulatory Visit (INDEPENDENT_AMBULATORY_CARE_PROVIDER_SITE_OTHER): Payer: Medicare Other | Admitting: Internal Medicine

## 2012-07-14 VITALS — BP 140/88 | HR 74 | Temp 97.9°F | Resp 20 | Ht 59.0 in | Wt 135.0 lb

## 2012-07-14 DIAGNOSIS — I10 Essential (primary) hypertension: Secondary | ICD-10-CM | POA: Diagnosis not present

## 2012-07-14 DIAGNOSIS — D0590 Unspecified type of carcinoma in situ of unspecified breast: Secondary | ICD-10-CM

## 2012-07-14 DIAGNOSIS — E039 Hypothyroidism, unspecified: Secondary | ICD-10-CM

## 2012-07-14 DIAGNOSIS — Z86010 Personal history of colonic polyps: Secondary | ICD-10-CM

## 2012-07-14 DIAGNOSIS — D0502 Lobular carcinoma in situ of left breast: Secondary | ICD-10-CM

## 2012-07-14 DIAGNOSIS — E785 Hyperlipidemia, unspecified: Secondary | ICD-10-CM | POA: Diagnosis not present

## 2012-07-14 DIAGNOSIS — Z0000 Encounter for general adult medical examination without abnormal findings: Secondary | ICD-10-CM | POA: Diagnosis not present

## 2012-07-14 MED ORDER — ZOLPIDEM TARTRATE 5 MG PO TABS: 5.0000 mg | ORAL_TABLET | Freq: Every evening | ORAL | Status: DC | PRN

## 2012-07-14 MED ORDER — ZOLPIDEM TARTRATE 10 MG PO TABS: ORAL_TABLET | ORAL | Status: DC

## 2012-07-14 NOTE — Progress Notes (Signed)
Subjective:    Patient ID: Christine Perez, female    DOB: July 15, 1925, 79 y.o.   MRN: [id number]  HPI   HPI 79 year old patient who is seen today for an annual exam. Medical problems include hypothyroidism as well as dyslipidemia. She has treated hypertension. She is doing quite well. She does have a history of colonic polyps and underwent a colonoscopy in the past for approximately 3 years ago; no major concerns or complaints today    Alcohol-Tobacco  Smoking Status: never  Allergies:  1) ! Codeine   Past History:  Past Medical History:  Reviewed history from 10/09/2006 and no changes required.  Hypertension  Hypercholesterolemia  Colonic polyps, hx of  Hyperlipidemia  Hypothyroidism  Osteoporosis   Past Surgical History:  Appendectomy  Cholecystectomy  Hysterectomy  Lumpectomy-left  Tonsillectomy  cataracts 5-10  Status post left partial mastectomy April 2014 for lobular carcinoma in situ or an AAA is on the left with flexion he is alert for her 54 with heart is [place] for her to point and in the is or what was he is and the on 8 he is in the and 8 for her right is a and is and is on the multiple I see you in 6 I is a CXR or in the in her and birth and is in chair he is alert and will call us he is will mover this weekend backpack on file from July 1 week in June this rescheduled from the office the is for sure you  Family History:  Reviewed history from 10/09/2006 and no changes required.  Family History Other cancer-Colon, breast ; mother with colon cancer sister and a maternal cousin with breast cancer ; maternal aunt with ovarian cancer Fam hxMI   Social History:  Reviewed history from 10/09/2006 and no changes required.  Married  Alcohol use-no  NonsmokerSmoking Status: never   Medicare Wellness  1. Risk factors, based on past M,S,F history-cardiovascular risk factors include hypertension dyslipidemia  2. Physical activities: Fairly sedentary but no restrictions   3. Depression/mood: No history depression or mood disorder  4. Hearing: No deficits  5. ADL's: Independent in all aspects of daily living  6. Fall risk: Low to moderate due to a and mild obesity  7. Home safety: No problems identified. Lives with her daughter  63. Height weight, and visual acuity; height and weight stable does have a history of osteoporosis and kyphosis. Recently passed her driver's license exam with no restrictions with glasses she has a cataract extraction surgery in the past  9. Counseling: Heart healthy diet modest weight loss and more regular exercise all encouraged  10. Lab orders based on risk factors: Laboratory profile including TSH and lipid panel will be reviewed  11. Referral: Not appropriate at this time. Has had a recent mammogram  12. Care plan: Restricted salt diet exercise weight loss encouraged  13. Cognitive assessment: Alert and appropriate with normal affect. No cognitive dysfunction   Past Medical History  Diagnosis Date  . CARDIAC ARRHYTHMIA 01/08/2010  . COLONIC POLYPS, HX OF 10/09/2006  . HYPERLIPIDEMIA 10/09/2006  . HYPERTENSION 10/09/2006  . HYPOTHYROIDISM 10/09/2006  . LASSITUDE 08/15/2009  . OSTEOPOROSIS 10/09/2006  . VERTIGO 08/07/2009  . Wears glasses   . Wears hearing aid     left    History   Social History  . Marital Status: Widowed    Spouse Name: N/A    Number of Children: N/A  . Years of Education: N/A  Occupational History  . Not on file.   Social History Main Topics  . Smoking status: Never Smoker   . Smokeless tobacco: Never Used  . Alcohol Use: No  . Drug Use: No  . Sexually Active: Not on file   Other Topics Concern  . Not on file   Social History Narrative  . No narrative on file    Past Surgical History  Procedure Laterality Date  . Appendectomy    . Cholecystectomy    . Abdominal hysterectomy    . Breast surgery      left - lumpectomy  . Tonsillectomy    . Cataract extraction    . Partial mastectomy  with needle localization Left 06/29/2012    Procedure: PARTIAL MASTECTOMY WITH NEEDLE LOCALIZATION;  Surgeon: [name], MD;  Location: [facility];  Service: General;  Laterality: Left;    No family history on file.  Allergies  Allergen Reactions  . Codeine Shortness Of Breath    Difficulty breathing    Current Outpatient Prescriptions on File Prior to Visit  Medication Sig Dispense Refill  . alendronate (FOSAMAX) 70 MG tablet Take 1 tablet (70 mg total) by mouth every 7 (seven) days. Take with a full glass of water on an empty stomach.  4 tablet  11  . atorvastatin (LIPITOR) 20 MG tablet Take 1 tablet (20 mg total) by mouth daily.  90 tablet  6  . calcium-vitamin D (OSCAL WITH D) 500-200 MG-UNIT per tablet Take 1 tablet by mouth daily.      Marland Kitchen HYDROcodone-acetaminophen (NORCO/VICODIN) 5-325 MG per tablet Take 1-2 tablets by mouth every 4 (four) hours as needed for pain.  25 tablet  1  . levothyroxine (SYNTHROID, LEVOTHROID) 75 MCG tablet Take 1 tablet (75 mcg total) by mouth daily.  90 tablet  6  . losartan-hydrochlorothiazide (HYZAAR) 100-12.5 MG per tablet Take 1 tablet by mouth daily.  90 tablet  6  . meclizine (ANTIVERT) 25 MG tablet Take 1 tablet (25 mg total) by mouth 3 (three) times daily as needed.  30 tablet  4  . Multiple Vitamin (MULTIVITAMIN) tablet Take 1 tablet by mouth daily.      . vitamin C (ASCORBIC ACID) 500 MG tablet Take 500 mg by mouth daily.      . vitamin E 400 UNIT capsule Take 800 Units by mouth daily.       No current facility-administered medications on file prior to visit.    BP 140/88  Pulse 74  Temp(Src) 97.9 F (36.6 C) (Oral)  Resp 20  Ht 4\' 11"  (1.499 m)  Wt 135 lb (61.236 kg)  BMI 27.25 kg/m2  SpO2 98%      Review of Systems  Constitutional: Negative for fever, appetite change, fatigue and unexpected weight change.  HENT: Positive for hearing loss. Negative for ear pain, nosebleeds, congestion, sore throat, mouth  sores, trouble swallowing, neck stiffness, dental problem, voice change, sinus pressure and tinnitus.   Eyes: Negative for photophobia, pain, redness and visual disturbance.  Respiratory: Negative for cough, chest tightness and shortness of breath.   Cardiovascular: Negative for chest pain, palpitations and leg swelling.  Gastrointestinal: Negative for nausea, vomiting, abdominal pain, diarrhea, constipation, blood in stool, abdominal distention and rectal pain.  Genitourinary: Negative for dysuria, urgency, frequency, hematuria, flank pain, vaginal bleeding, vaginal discharge, difficulty urinating, genital sores, vaginal pain, menstrual problem and pelvic pain.  Musculoskeletal: Negative for back pain and arthralgias.  Skin: Negative for rash.  Neurological:  Negative for dizziness, syncope, speech difficulty, weakness, light-headedness, numbness and headaches.  Hematological: Negative for adenopathy. Does not bruise/bleed easily.  Psychiatric/Behavioral: Negative for suicidal ideas, behavioral problems, self-injury, dysphoric mood and agitation. The patient is not nervous/anxious.        Objective:   Physical Exam  Constitutional: She is oriented to person, place, and time. She appears well-developed and well-nourished.  HENT:  Head: Normocephalic and atraumatic.  Right Ear: External ear normal.  Left Ear: External ear normal.  Mouth/Throat: Oropharynx is clear and moist.  Eyes: Conjunctivae and EOM are normal.  Neck: Normal range of motion. Neck supple. No JVD present. No thyromegaly present.  Cardiovascular: Normal rate, regular rhythm, normal heart sounds and intact distal pulses.   No murmur heard. Pulmonary/Chest: Effort normal and breath sounds normal. She has no wheezes. She has no rales.  Healing a left mastectomy scar lateral breast Residual resolving ecchymoses from recent surgery  Abdominal: Soft. Bowel sounds are normal. She exhibits no distension and no mass. There is no  tenderness. There is no rebound and no guarding.  Abdominal scar mildly obese no masses  Musculoskeletal: Normal range of motion. She exhibits no edema and no tenderness.  Neurological: She is alert and oriented to person, place, and time. She has normal reflexes. No cranial nerve deficit. She exhibits normal muscle tone. Coordination normal.  Skin: Skin is warm and dry. No rash noted.  Psychiatric: She has a normal mood and affect. Her behavior is normal.    5 mm slightly raised papule right outer forearm. This was treated with cryotherapy without difficulty      Assessment & Plan:   Preventive health exam Hypothyroidism. We'll check a TSH dyslipidemia we'll check a lipid profile Hypertension stable Benign skin lesion right forearm. Status post cryotherapy Status post recent partial mastectomy for lobular carcinoma in situ. Patient is still considering genetic testing   Recheck 6 months

## 2012-07-14 NOTE — Patient Instructions (Signed)
Limit your sodium (Salt) intake    It is important that you exercise regularly, at least 20 minutes 3 to 4 times per week.  If you develop chest pain or shortness of breath seek  medical attention.  Schedule your mammogram annually  Return in one year for follow-up

## 2012-07-16 ENCOUNTER — Encounter: Payer: Self-pay | Admitting: Internal Medicine

## 2012-08-04 ENCOUNTER — Encounter: Payer: Self-pay | Admitting: *Deleted

## 2012-08-04 NOTE — Progress Notes (Signed)
Received information that [facility] could have sent some referrals directly to the doctors in the workque.  Pulled a referral for the pt to see Dr. Welton Flakes for the High Risk clinic.  I sent info to Tiffany to schedule and emailed Huntley Dec at [facility] to make her aware.

## 2012-08-06 ENCOUNTER — Telehealth: Payer: Self-pay | Admitting: Oncology

## 2012-08-06 NOTE — Telephone Encounter (Signed)
S/W PT IN RE TO HIGH RISK CLINIC APPT  07/15 @ 11 W/DR. KHAN. WELCOME PACKET MAILED.

## 2012-08-11 ENCOUNTER — Other Ambulatory Visit: Payer: Self-pay | Admitting: Internal Medicine

## 2012-08-26 ENCOUNTER — Other Ambulatory Visit: Payer: Self-pay | Admitting: Internal Medicine

## 2012-09-21 DIAGNOSIS — M67919 Unspecified disorder of synovium and tendon, unspecified shoulder: Secondary | ICD-10-CM | POA: Diagnosis not present

## 2012-09-21 DIAGNOSIS — M719 Bursopathy, unspecified: Secondary | ICD-10-CM | POA: Diagnosis not present

## 2012-09-28 ENCOUNTER — Encounter: Payer: Medicare Other | Admitting: Oncology

## 2012-09-28 ENCOUNTER — Telehealth: Payer: Self-pay | Admitting: Oncology

## 2012-09-28 NOTE — Telephone Encounter (Signed)
C/D 09/28/12 for appt.09/28/12

## 2012-11-03 ENCOUNTER — Telehealth (INDEPENDENT_AMBULATORY_CARE_PROVIDER_SITE_OTHER): Payer: Self-pay

## 2012-11-03 NOTE — Telephone Encounter (Signed)
The pt called concerning when she should have a mammogram and what type.  I reviewed her notes and chart.  She had surgery in April and was released from our office to have yearly Mammograms.  I told her she should have one in March of next year.  She thought she should be checked again in October of this year.  [place] told her to call here.  I told her even Dr Vergia Alberts note said annual mammogram.  She had LCIS and she has a strong family hx of breast cancer.  She wants me to check with Dr Derrell Lolling to see if she should be checked again in October and if they should look at the right side again where that cyst was.  Please advise.

## 2012-11-03 NOTE — Telephone Encounter (Signed)
I will be happy to see her in the office in October, if she desires. No imaging.  It looks like I referred her to see Dr. Drue Second in the high risk Breast. Clinic. It does not appear that she has been seen.  Please tell her that we would also like for  her to be seen in the high risk breast clinic.  hmi

## 2012-11-17 ENCOUNTER — Telehealth: Payer: Self-pay | Admitting: Internal Medicine

## 2012-11-17 NOTE — Telephone Encounter (Signed)
Patient Information:  Caller Name: Mireya  Phone: [phone]  Patient: Christine Perez, Christine Perez  Gender: Female  DOB: 12-Oct-1925  Age: 79 Years  PCP: Eleonore Chiquito (Family Practice > 71yrs old)  Office Follow Up:  Does the office need to follow up with this patient?: Yes  Instructions For The Office: medications concerns.  RN Note:  Advised caller that I will share her concerns with Dr. Kirtland Bouchard and staff.  Please contact patient concerning medications.  Symptoms  Reason For Call & Symptoms: Patient states she has been on Lipitor for "years". She developed pain in her right side "like my liver area'.  She states not constant but it was frequently for a few months. She states she was reading the side effects and thought it was related to the lipitor. She stopped the lipitor for three weeks and no longer has the pain. She would like to try another cholesterol medication and does not want to take lipitor.  (2) She states she has generally felt weak and no strength  since breast surgery. She denies illness, no dizziness or lightheaded.  No new medications for the cause.  Marland Kitchen She would like Dr.K to review her medications.  Reviewed Health History In EMR: Yes  Reviewed Medications In EMR: Yes  Reviewed Allergies In EMR: Yes  Reviewed Surgeries / Procedures: Yes  Date of Onset of Symptoms: 10/27/2012  Guideline(s) Used:  No Protocol Available - Information Only  Disposition Per Guideline:   Discuss with PCP and Callback by Nurse Today  Reason For Disposition Reached:   Nursing judgment  Advice Given:  N/A  RN Overrode Recommendation:  Follow Up With Office Later  Patient has medications concerns

## 2012-11-18 NOTE — Telephone Encounter (Signed)
Please see message and advise 

## 2012-11-18 NOTE — Telephone Encounter (Signed)
Okay to hold Lipitor at this time;. We'll reassess need for statin therapy at her next office visit

## 2012-11-18 NOTE — Telephone Encounter (Signed)
Spoke to pt told her to hold Lipitor till next visit and will reassess need for statin therapy then. Pt verbalized understanding, but stated she is not schedule again till next April. Checked pt's chart told her she was to return in 6 months, need to call back and schedule for October. Pt verbalized understanding and will call back and schedule.

## 2012-12-21 ENCOUNTER — Encounter: Payer: Self-pay | Admitting: Internal Medicine

## 2012-12-21 ENCOUNTER — Ambulatory Visit (INDEPENDENT_AMBULATORY_CARE_PROVIDER_SITE_OTHER): Payer: Medicare Other | Admitting: Internal Medicine

## 2012-12-21 VITALS — BP 136/80 | HR 97 | Temp 97.8°F | Resp 20 | Wt 134.0 lb

## 2012-12-21 DIAGNOSIS — E785 Hyperlipidemia, unspecified: Secondary | ICD-10-CM | POA: Diagnosis not present

## 2012-12-21 DIAGNOSIS — I10 Essential (primary) hypertension: Secondary | ICD-10-CM

## 2012-12-21 DIAGNOSIS — Z23 Encounter for immunization: Secondary | ICD-10-CM

## 2012-12-21 DIAGNOSIS — E039 Hypothyroidism, unspecified: Secondary | ICD-10-CM

## 2012-12-21 DIAGNOSIS — M810 Age-related osteoporosis without current pathological fracture: Secondary | ICD-10-CM | POA: Diagnosis not present

## 2012-12-21 DIAGNOSIS — I499 Cardiac arrhythmia, unspecified: Secondary | ICD-10-CM | POA: Diagnosis not present

## 2012-12-21 LAB — CBC WITH DIFFERENTIAL/PLATELET
Basophils Absolute: 0 10*3/uL (ref 0.0–0.1)
Eosinophils Absolute: 0 10*3/uL (ref 0.0–0.7)
HCT: 38.8 % (ref 36.0–46.0)
Lymphs Abs: 1.5 10*3/uL (ref 0.7–4.0)
MCHC: 32.9 g/dL (ref 30.0–36.0)
Monocytes Absolute: 0.7 10*3/uL (ref 0.1–1.0)
Monocytes Relative: 12.2 % — ABNORMAL HIGH (ref 3.0–12.0)
Platelets: 116 10*3/uL — ABNORMAL LOW (ref 150.0–400.0)
RDW: 14.4 % (ref 11.5–14.6)

## 2012-12-21 LAB — COMPREHENSIVE METABOLIC PANEL
ALT: 20 U/L (ref 0–35)
CO2: 33 mEq/L — ABNORMAL HIGH (ref 19–32)
Chloride: 103 mEq/L (ref 96–112)
GFR: 64 mL/min (ref >60.00)
Sodium: 143 mEq/L (ref 135–145)
Total Bilirubin: 0.5 mg/dL (ref 0.3–1.2)
Total Protein: 6.9 g/dL (ref 6.0–8.3)

## 2012-12-21 LAB — LIPID PANEL
Total CHOL/HDL Ratio: 4
Triglycerides: 146 mg/dL (ref 0.0–149.0)

## 2012-12-21 LAB — LDL CHOLESTEROL, DIRECT: Direct LDL: 133.2 mg/dL

## 2012-12-21 NOTE — Patient Instructions (Signed)
Limit your sodium (Salt) intake    It is important that you exercise regularly, at least 20 minutes 3 to 4 times per week.  If you develop chest pain or shortness of breath seek  medical attention.  Take a calcium supplement, plus 800-1200 units of vitamin D   

## 2012-12-21 NOTE — Progress Notes (Signed)
Subjective:    Patient ID: Christine Perez, female    DOB: Sep 06, 1925, 79 y.o.   MRN: [id number]  HPI  79 year old patient who is seen today for followup. She has hypertension dyslipidemia and hypothyroidism. She also has a history of frequent PACs. She has done remarkably well over the past 6 months. No concerns or complaints. She is now on Fosamax for osteoporosis. She discontinued Lipitor 3 months ago due to some right upper quadrant pain. This has not reoccurred  Wt Readings from Last 3 Encounters:  12/21/12 134 lb (60.782 kg)  07/14/12 135 lb (61.236 kg)  07/12/12 134 lb 3.2 oz (60.873 kg)    Past Medical History  Diagnosis Date  . CARDIAC ARRHYTHMIA 01/08/2010  . COLONIC POLYPS, HX OF 10/09/2006  . HYPERLIPIDEMIA 10/09/2006  . HYPERTENSION 10/09/2006  . HYPOTHYROIDISM 10/09/2006  . LASSITUDE 08/15/2009  . OSTEOPOROSIS 10/09/2006  . VERTIGO 08/07/2009  . Wears glasses   . Wears hearing aid     left    History   Social History  . Marital Status: Widowed    Spouse Name: N/A    Number of Children: N/A  . Years of Education: N/A   Occupational History  . Not on file.   Social History Main Topics  . Smoking status: Never Smoker   . Smokeless tobacco: Never Used  . Alcohol Use: No  . Drug Use: No  . Sexual Activity: Not on file   Other Topics Concern  . Not on file   Social History Narrative  . No narrative on file    Past Surgical History  Procedure Laterality Date  . Appendectomy    . Cholecystectomy    . Abdominal hysterectomy    . Breast surgery      left - lumpectomy  . Tonsillectomy    . Cataract extraction    . Partial mastectomy with needle localization Left 06/29/2012    Procedure: PARTIAL MASTECTOMY WITH NEEDLE LOCALIZATION;  Surgeon: [name], MD;  Location: [facility];  Service: General;  Laterality: Left;    No family history on file.  Allergies  Allergen Reactions  . Codeine Shortness Of Breath    Difficulty breathing     Current Outpatient Prescriptions on File Prior to Visit  Medication Sig Dispense Refill  . alendronate (FOSAMAX) 70 MG tablet Take 1 tablet (70 mg total) by mouth every 7 (seven) days. Take with a full glass of water on an empty stomach.  4 tablet  11  . calcium-vitamin D (OSCAL WITH D) 500-200 MG-UNIT per tablet Take 1 tablet by mouth daily.      Marland Kitchen HYDROcodone-acetaminophen (NORCO/VICODIN) 5-325 MG per tablet Take 1-2 tablets by mouth every 4 (four) hours as needed for pain.  25 tablet  1  . losartan-hydrochlorothiazide (HYZAAR) 100-12.5 MG per tablet TAKE ONE TABLET BY MOUTH EVERY DAY  90 tablet  3  . meclizine (ANTIVERT) 25 MG tablet Take 1 tablet (25 mg total) by mouth 3 (three) times daily as needed.  30 tablet  4  . Multiple Vitamin (MULTIVITAMIN) tablet Take 1 tablet by mouth daily.      Marland Kitchen SYNTHROID 75 MCG tablet TAKE ONE TABLET BY MOUTH EVERY DAY  90 tablet  1  . traMADol (ULTRAM) 50 MG tablet Take 50 mg by mouth every 6 (six) hours as needed.       . vitamin C (ASCORBIC ACID) 500 MG tablet Take 500 mg by mouth daily.      Marland Kitchen  vitamin E 400 UNIT capsule Take 800 Units by mouth daily.      Marland Kitchen zolpidem (AMBIEN) 5 MG tablet Take 1 tablet (5 mg total) by mouth at bedtime as needed for sleep.  15 tablet  1  . atorvastatin (LIPITOR) 20 MG tablet TAKE ONE TABLET BY MOUTH EVERY DAY  90 tablet  3   No current facility-administered medications on file prior to visit.    BP 136/80  Pulse 97  Temp(Src) 97.8 F (36.6 C) (Oral)  Resp 20  Wt 134 lb (60.782 kg)  BMI 27.05 kg/m2  SpO2 97%     Review of Systems  Constitutional: Negative.   HENT: Negative for hearing loss, congestion, sore throat, rhinorrhea, dental problem, sinus pressure and tinnitus.   Eyes: Negative for pain, discharge and visual disturbance.  Respiratory: Negative for cough and shortness of breath.   Cardiovascular: Negative for chest pain, palpitations and leg swelling.  Gastrointestinal: Positive for abdominal  pain. Negative for nausea, vomiting, diarrhea, constipation, blood in stool and abdominal distention.  Genitourinary: Negative for dysuria, urgency, frequency, hematuria, flank pain, vaginal bleeding, vaginal discharge, difficulty urinating, vaginal pain and pelvic pain.  Musculoskeletal: Negative for joint swelling, arthralgias and gait problem.  Skin: Negative for rash.  Neurological: Negative for dizziness, syncope, speech difficulty, weakness, numbness and headaches.  Hematological: Negative for adenopathy.  Psychiatric/Behavioral: Negative for behavioral problems, dysphoric mood and agitation. The patient is not nervous/anxious.        Objective:   Physical Exam  Constitutional: She is oriented to person, place, and time. She appears well-developed and well-nourished.  HENT:  Head: Normocephalic.  Right Ear: External ear normal.  Left Ear: External ear normal.  Mouth/Throat: Oropharynx is clear and moist.  Eyes: Conjunctivae and EOM are normal. Pupils are equal, round, and reactive to light.  Neck: Normal range of motion. Neck supple. No thyromegaly present.  Cardiovascular: Normal rate, normal heart sounds and intact distal pulses.   Frequent ectopics  Pulmonary/Chest: Effort normal and breath sounds normal.  Abdominal: Soft. Bowel sounds are normal. She exhibits no mass. There is no tenderness.  Surgical scar right mid abdominal area  Musculoskeletal: Normal range of motion.  Lymphadenopathy:    She has no cervical adenopathy.  Neurological: She is alert and oriented to person, place, and time.  Skin: Skin is warm and dry. No rash noted.  Psychiatric: She has a normal mood and affect. Her behavior is normal.          Assessment & Plan:   Hypertension well controlled Dyslipidemia. We'll check a lipid profile. We'll also rechallenge with Lipitor Osteoporosis Hypothyroidism. We'll check a TSH

## 2012-12-23 ENCOUNTER — Telehealth: Payer: Self-pay | Admitting: Internal Medicine

## 2012-12-23 NOTE — Telephone Encounter (Signed)
Called and spoke with pt and pt is aware. Pt wanted to know if she should continue lipitor.  Pt would like to know since you all discussed this on Tuesday if she can discontinue due to side effects. Pls advise.

## 2012-12-23 NOTE — Telephone Encounter (Signed)
Ok to d/c lipitor

## 2012-12-23 NOTE — Telephone Encounter (Signed)
Please call/notify patient that lab/test/procedure is normal 

## 2012-12-23 NOTE — Telephone Encounter (Signed)
Pls advise.  

## 2012-12-23 NOTE — Telephone Encounter (Signed)
Pt is calling to inquire about lab results from 12/21/12. Please assist.

## 2012-12-24 NOTE — Telephone Encounter (Signed)
Called and spoke with pt and pt is aware.  

## 2013-01-10 ENCOUNTER — Other Ambulatory Visit: Payer: Self-pay | Admitting: *Deleted

## 2013-01-10 MED ORDER — TRAMADOL HCL 50 MG PO TABS: 50.0000 mg | ORAL_TABLET | Freq: Four times a day (QID) | ORAL | Status: DC | PRN

## 2013-01-17 ENCOUNTER — Other Ambulatory Visit: Payer: Self-pay | Admitting: Internal Medicine

## 2013-02-05 ENCOUNTER — Other Ambulatory Visit: Payer: Self-pay | Admitting: Internal Medicine

## 2013-02-07 NOTE — Telephone Encounter (Signed)
Pt needs refill on synthroid call into [facility] in [place]. Pt has one pill left

## 2013-06-01 DIAGNOSIS — R928 Other abnormal and inconclusive findings on diagnostic imaging of breast: Secondary | ICD-10-CM | POA: Diagnosis not present

## 2013-06-07 ENCOUNTER — Other Ambulatory Visit: Payer: Self-pay | Admitting: Radiology

## 2013-06-07 DIAGNOSIS — Z0189 Encounter for other specified special examinations: Secondary | ICD-10-CM | POA: Diagnosis not present

## 2013-06-07 DIAGNOSIS — N6019 Diffuse cystic mastopathy of unspecified breast: Secondary | ICD-10-CM | POA: Diagnosis not present

## 2013-06-07 DIAGNOSIS — D249 Benign neoplasm of unspecified breast: Secondary | ICD-10-CM | POA: Diagnosis not present

## 2013-06-28 ENCOUNTER — Encounter (INDEPENDENT_AMBULATORY_CARE_PROVIDER_SITE_OTHER): Payer: Self-pay

## 2013-07-05 ENCOUNTER — Encounter (INDEPENDENT_AMBULATORY_CARE_PROVIDER_SITE_OTHER): Payer: Self-pay

## 2013-07-18 ENCOUNTER — Encounter: Payer: Self-pay | Admitting: Internal Medicine

## 2013-07-18 ENCOUNTER — Telehealth: Payer: Self-pay | Admitting: Internal Medicine

## 2013-07-18 ENCOUNTER — Ambulatory Visit (INDEPENDENT_AMBULATORY_CARE_PROVIDER_SITE_OTHER): Payer: Medicare Other | Admitting: Internal Medicine

## 2013-07-18 VITALS — BP 122/80 | HR 81 | Temp 98.2°F | Resp 18 | Ht 59.5 in | Wt 131.0 lb

## 2013-07-18 DIAGNOSIS — E785 Hyperlipidemia, unspecified: Secondary | ICD-10-CM

## 2013-07-18 DIAGNOSIS — I4891 Unspecified atrial fibrillation: Secondary | ICD-10-CM

## 2013-07-18 DIAGNOSIS — M810 Age-related osteoporosis without current pathological fracture: Secondary | ICD-10-CM | POA: Diagnosis not present

## 2013-07-18 DIAGNOSIS — R5383 Other fatigue: Secondary | ICD-10-CM

## 2013-07-18 DIAGNOSIS — Z0000 Encounter for general adult medical examination without abnormal findings: Secondary | ICD-10-CM

## 2013-07-18 DIAGNOSIS — D2360 Other benign neoplasm of skin of unspecified upper limb, including shoulder: Secondary | ICD-10-CM | POA: Diagnosis not present

## 2013-07-18 DIAGNOSIS — D0590 Unspecified type of carcinoma in situ of unspecified breast: Secondary | ICD-10-CM

## 2013-07-18 DIAGNOSIS — Z23 Encounter for immunization: Secondary | ICD-10-CM | POA: Diagnosis not present

## 2013-07-18 DIAGNOSIS — I10 Essential (primary) hypertension: Secondary | ICD-10-CM

## 2013-07-18 DIAGNOSIS — R5381 Other malaise: Secondary | ICD-10-CM | POA: Diagnosis not present

## 2013-07-18 DIAGNOSIS — E039 Hypothyroidism, unspecified: Secondary | ICD-10-CM | POA: Diagnosis not present

## 2013-07-18 DIAGNOSIS — D0502 Lobular carcinoma in situ of left breast: Secondary | ICD-10-CM

## 2013-07-18 DIAGNOSIS — I499 Cardiac arrhythmia, unspecified: Secondary | ICD-10-CM

## 2013-07-18 LAB — CBC WITH DIFFERENTIAL/PLATELET
BASOS PCT: 0.4 % (ref 0.0–3.0)
Basophils Absolute: 0 10*3/uL (ref 0.0–0.1)
EOS PCT: 0.7 % (ref 0.0–5.0)
Eosinophils Absolute: 0 10*3/uL (ref 0.0–0.7)
HEMATOCRIT: 41.8 % (ref 36.0–46.0)
HEMOGLOBIN: 13.9 g/dL (ref 12.0–15.0)
LYMPHS ABS: 1.5 10*3/uL (ref 0.7–4.0)
Lymphocytes Relative: 32.4 % (ref 12.0–46.0)
MCHC: 33.2 g/dL (ref 30.0–36.0)
MCV: 93.4 fl (ref 78.0–100.0)
MONO ABS: 0.5 10*3/uL (ref 0.1–1.0)
MONOS PCT: 11 % (ref 3.0–12.0)
NEUTROS ABS: 2.5 10*3/uL (ref 1.4–7.7)
Neutrophils Relative %: 55.5 % (ref 43.0–77.0)
Platelets: 111 10*3/uL — ABNORMAL LOW (ref 150.0–400.0)
RBC: 4.48 Mil/uL (ref 3.87–5.11)
RDW: 14.5 % (ref 11.5–14.6)
WBC: 4.5 10*3/uL (ref 4.5–10.5)

## 2013-07-18 LAB — COMPREHENSIVE METABOLIC PANEL
ALT: 18 U/L (ref 0–35)
AST: 25 U/L (ref 0–37)
Albumin: 4.3 g/dL (ref 3.5–5.2)
Alkaline Phosphatase: 82 U/L (ref 39–117)
BUN: 20 mg/dL (ref 6–23)
CALCIUM: 10.8 mg/dL — AB (ref 8.4–10.5)
CHLORIDE: 102 meq/L (ref 96–112)
CO2: 31 meq/L (ref 19–32)
CREATININE: 0.8 mg/dL (ref 0.4–1.2)
GFR: 73.34 mL/min (ref >60.00)
GLUCOSE: 89 mg/dL (ref 70–99)
Potassium: 4.2 mEq/L (ref 3.5–5.1)
Sodium: 141 mEq/L (ref 135–145)
Total Bilirubin: 0.6 mg/dL (ref 0.3–1.2)
Total Protein: 7.2 g/dL (ref 6.0–8.3)

## 2013-07-18 LAB — TSH: TSH: 0.87 u[IU]/mL (ref 0.35–5.50)

## 2013-07-18 LAB — SEDIMENTATION RATE: Sed Rate: 9 mm/hr (ref 0–22)

## 2013-07-18 MED ORDER — ZOLPIDEM TARTRATE 5 MG PO TABS: 5.0000 mg | ORAL_TABLET | Freq: Every evening | ORAL | Status: DC | PRN

## 2013-07-18 MED ORDER — MECLIZINE HCL 25 MG PO TABS: 25.0000 mg | ORAL_TABLET | Freq: Three times a day (TID) | ORAL | Status: DC | PRN

## 2013-07-18 MED ORDER — LOSARTAN POTASSIUM-HCTZ 100-12.5 MG PO TABS: ORAL_TABLET | ORAL | Status: DC

## 2013-07-18 MED ORDER — DILTIAZEM HCL ER 120 MG PO CP24: 120.0000 mg | ORAL_CAPSULE | Freq: Every day | ORAL | Status: DC

## 2013-07-18 MED ORDER — RIVAROXABAN 20 MG PO TABS: 20.0000 mg | ORAL_TABLET | Freq: Every day | ORAL | Status: DC

## 2013-07-18 MED ORDER — SYNTHROID 75 MCG PO TABS: ORAL_TABLET | ORAL | Status: DC

## 2013-07-18 MED ORDER — ALENDRONATE SODIUM 70 MG PO TABS: 70.0000 mg | ORAL_TABLET | ORAL | Status: DC

## 2013-07-18 MED ORDER — TRAMADOL HCL 50 MG PO TABS: 50.0000 mg | ORAL_TABLET | Freq: Four times a day (QID) | ORAL | Status: DC | PRN

## 2013-07-18 NOTE — Progress Notes (Signed)
Pre-visit discussion using our clinic review tool. No additional management support is needed unless otherwise documented below in the visit note.  

## 2013-07-18 NOTE — Telephone Encounter (Signed)
Spoke to pt told her needs to start medications right away if she can. Pt verbalized understanding.

## 2013-07-18 NOTE — Telephone Encounter (Signed)
Pt would like to know when she should start her new meds? Pt not sure she should begin before echo pls call

## 2013-07-18 NOTE — Patient Instructions (Addendum)
Take a calcium supplement, plus [phone] units of vitamin D  Limit your sodium (Salt) intake    It is important that you exercise regularly, at least 20 minutes 3 to 4 times per week.  If you develop chest pain or shortness of breath seek  medical attention.  Return in 2 weeks for followup Atrial Fibrillation Atrial fibrillation is a type of irregular heart rhythm (arrhythmia). During atrial fibrillation, the upper chambers of the heart (atria) quiver continuously in a chaotic pattern. This causes an irregular and often rapid heart rate.  Atrial fibrillation is the result of the heart becoming overloaded with disorganized signals that tell it to beat. These signals are normally released one at a time by a part of the right atrium called the sinoatrial node. They then travel from the atria to the lower chambers of the heart (ventricles), causing the atria and ventricles to contract and pump blood as they pass. In atrial fibrillation, parts of the atria outside of the sinoatrial node also release these signals. This results in two problems. First, the atria receive so many signals that they do not have time to fully contract. Second, the ventricles, which can only receive one signal at a time, beat irregularly and out of rhythm with the atria.  There are three types of atrial fibrillation:   Paroxysmal Paroxysmal atrial fibrillation starts suddenly and stops on its own within a week.   Persistent Persistent atrial fibrillation lasts for more than a week. It may stop on its own or with treatment.   Permanent Permanent atrial fibrillation does not go away. Episodes of atrial fibrillation may lead to permanent atrial fibrillation.  Atrial fibrillation can prevent your heart from pumping blood normally. It increases your risk of stroke and can lead to heart failure.  CAUSES   Heart conditions, including a heart attack, heart failure, coronary artery disease, and heart valve conditions.    Inflammation of the sac that surrounds the heart (pericarditis).   Blockage of an artery in the lungs (pulmonary embolism).   Pneumonia or other infections.   Chronic lung disease.   Thyroid problems, especially if the thyroid is overactive (hyperthyroidism).   Caffeine, excessive alcohol use, and use of some illegal drugs.   Use of some medications, including certain decongestants and diet pills.   Heart surgery.   Birth defects.  Sometimes, no cause can be found. When this happens, the atrial fibrillation is called lone atrial fibrillation. The risk of complications from atrial fibrillation increases if you have lone atrial fibrillation and you are age 44 years or older. RISK FACTORS  Heart failure.  Coronary artery disease  Diabetes mellitus.   High blood pressure (hypertension).   Obesity.   Other arrhythmias.   Increased age. SYMPTOMS   A feeling that your heart is beating rapidly or irregularly.   A feeling of discomfort or pain in your chest.   Shortness of breath.   Sudden lightheadedness or weakness.   Getting tired easily when exercising.   Urinating more often than normal (mainly when atrial fibrillation first begins).  In paroxysmal atrial fibrillation, symptoms may start and suddenly stop. DIAGNOSIS  Your caregiver may be able to detect atrial fibrillation when taking your pulse. Usually, testing is needed to diagnosis atrial fibrillation. Tests may include:   Electrocardiography. During this test, the electrical impulses of your heart are recorded while you are lying down.   Echocardiography. During echocardiography, sound waves are used to evaluate how blood flows through your heart.  Stress test. There is more than one type of stress test. If a stress test is needed, ask your caregiver about which type is best for you.   Chest X-ray exam.   Blood tests.   Computed tomography (CT).  TREATMENT   Treating any  underlying conditions. For example, if you have an overactive thyroid, treating the condition may correct atrial fibrillation.   Medication. Medications may be given to control a rapid heart rate or to prevent blood clots, heart failure, or a stroke.   Procedure to correct the rhythm of the heart:  Electrical cardioversion. During electrical cardioversion, a controlled, low-energy shock is delivered to the heart through your skin. If you have chest pain, very low pressure blood pressure, or sudden heart failure, this procedure may need to be done as an emergency.  Catheter ablation. During this procedure, heart tissues that send the signals that cause atrial fibrillation are destroyed.  Maze or minimaze procedure. During this surgery, thin lines of heart tissue that carry the abnormal signals are destroyed. The maze procedure is an open-heart surgery. The minimaze procedure is a minimally invasive surgery. This means that small cuts are made to access the heart instead of a large opening.  Pulmonary venous isolation. During this surgery, tissue around the veins that carry blood from the lungs (pulmonary veins) is destroyed. This tissue is thought to carry the abnormal signals. HOME CARE INSTRUCTIONS   Take medications as directed by your caregiver.  Only take medications that your caregiver approves. Some medications can make atrial fibrillation worse or recur.  If blood thinners were prescribed by your caregiver, take them exactly as directed. Too much can cause bleeding. Too little and you will not have the needed protection against stroke and other problems.  Perform blood tests at home if directed by your caregiver.  Perform blood tests exactly as directed.   Quit smoking if you smoke.   Do not drink alcohol.   Do not drink caffeinated beverages such as coffee, soda, and some teas. You may drink decaffeinated coffee, soda, or tea.   Maintain a healthy weight. Do not use diet  pills unless your caregiver approves. They may make heart problems worse.   Follow diet instructions as directed by your caregiver.   Exercise regularly as directed by your caregiver.   Keep all follow-up appointments. PREVENTION  The following substances can cause atrial fibrillation to recur:   Caffeinated beverages.   Alcohol.   Certain medications, especially those used for breathing problems.   Certain herbs and herbal medications, such as those containing ephedra or ginseng.  Illegal drugs such as cocaine and amphetamines. Sometimes medications are given to prevent atrial fibrillation from recurring. Proper treatment of any underlying condition is also important in helping prevent recurrence.  SEEK MEDICAL CARE IF:  You notice a change in the rate, rhythm, or strength of your heartbeat.   You suddenly begin urinating more frequently.   You tire more easily when exerting yourself or exercising.  SEEK IMMEDIATE MEDICAL CARE IF:   You develop chest pain, abdominal pain, sweating, or weakness.  You feel sick to your stomach (nauseous).  You develop shortness of breath.  You suddenly develop swollen feet and ankles.  You feel dizzy.  You face or limbs feel numb or weak.  There is a change in your vision or speech. MAKE SURE YOU:   Understand these instructions.  Will watch your condition.  Will get help right away if you are not doing well  or get worse. Document Released: 03/03/2005 Document Revised: 06/28/2012 Document Reviewed: 04/13/2012 [facility] Patient Information 2014 [place].

## 2013-07-18 NOTE — Progress Notes (Signed)
Subjective:    Patient ID: Christine Perez, female    DOB: [birth year], 80 y.o.   MRN: [id number]  HPI   HPI  80 year old patient who is seen today for an annual exam.   Medical problems include hypothyroidism as well as dyslipidemia. She has treated hypertension. She is doing quite well. She does have a history of colonic polyps and underwent a colonoscopy in the past for approximately 4  years ago; no major concerns or complaints today except for some intermittent fatigue.  Laboratory studies from October 2014 reviewed;  recent breast biopsy March 2015 benign   Alcohol-Tobacco  Smoking Status: never   Allergies:  1) ! Codeine   Past History:  Past Medical History:  And in fact Hypertension  Hypercholesterolemia  Colonic polyps, hx of  Hyperlipidemia  Hypothyroidism  Osteoporosis   Past Surgical History:  Appendectomy  Cholecystectomy  Hysterectomy  Lumpectomy-left  Tonsillectomy  cataracts 5-10  Status post left partial mastectomy April 2014 for lobular carcinoma in situ  Family History:   Family History Other cancer-Colon, breast ; mother with colon cancer sister and a maternal cousin with breast cancer ; maternal aunt with ovarian cancer Fam hx MI   Social History:   Married  Alcohol use-no  NonsmokerSmoking Status: never   Medicare Wellness  1. Risk factors, based on past M,S,F history-cardiovascular risk factors include hypertension dyslipidemia  2. Physical activities: Fairly sedentary but no restrictions  3. Depression/mood: No history depression or mood disorder  4. Hearing: No deficits  5. ADL's: Independent in all aspects of daily living  6. Fall risk:  moderate due to  mild obesity and age 68. Home safety: No problems identified. Lives with her daughter  81. Height weight, and visual acuity; height and weight stable does have a history of osteoporosis and kyphosis. Recently passed her driver's license exam with no restrictions with glasses;  she has  a cataract extraction surgery in the past  9. Counseling: Heart healthy diet modest weight loss and more regular exercise all encouraged  10. Lab orders based on risk factors: Laboratory profile including TSH and lipid panel will be reviewed  11. Referral: Not appropriate at this time. Has had a recent mammogram  12. Care plan: Restricted salt diet exercise weight loss encouraged  13. Cognitive assessment: Alert and appropriate with normal affect. No cognitive dysfunction   Past Medical History  Diagnosis Date  . CARDIAC ARRHYTHMIA 01/08/2010  . COLONIC POLYPS, HX OF 10/09/2006  . HYPERLIPIDEMIA 10/09/2006  . HYPERTENSION 10/09/2006  . HYPOTHYROIDISM 10/09/2006  . LASSITUDE 08/15/2009  . OSTEOPOROSIS 10/09/2006  . VERTIGO 08/07/2009  . Wears glasses   . Wears hearing aid     left    History   Social History  . Marital Status: Widowed    Spouse Name: N/A    Number of Children: N/A  . Years of Education: N/A   Occupational History  . Not on file.   Social History Main Topics  . Smoking status: Never Smoker   . Smokeless tobacco: Never Used  . Alcohol Use: No  . Drug Use: No  . Sexual Activity: Not on file   Other Topics Concern  . Not on file   Social History Narrative  . No narrative on file    Past Surgical History  Procedure Laterality Date  . Appendectomy    . Cholecystectomy    . Abdominal hysterectomy    . Breast surgery      left - lumpectomy  .  Tonsillectomy    . Cataract extraction    . Partial mastectomy with needle localization Left 06/29/2012    Procedure: PARTIAL MASTECTOMY WITH NEEDLE LOCALIZATION;  Surgeon: Adin Hector, MD;  Location: [facility];  Service: General;  Laterality: Left;    No family history on file.  Allergies  Allergen Reactions  . Codeine Shortness Of Breath    Difficulty breathing    Current Outpatient Prescriptions on File Prior to Visit  Medication Sig Dispense Refill  . alendronate (FOSAMAX) 70 MG tablet  Take 1 tablet (70 mg total) by mouth every 7 (seven) days. Take with a full glass of water on an empty stomach.  4 tablet  11  . calcium-vitamin D (OSCAL WITH D) 500-200 MG-UNIT per tablet Take 1 tablet by mouth daily.      Marland Kitchen losartan-hydrochlorothiazide (HYZAAR) 100-12.5 MG per tablet TAKE ONE TABLET BY MOUTH EVERY DAY  90 tablet  3  . meclizine (ANTIVERT) 25 MG tablet Take 1 tablet (25 mg total) by mouth 3 (three) times daily as needed.  30 tablet  4  . Multiple Vitamin (MULTIVITAMIN) tablet Take 1 tablet by mouth daily.      Marland Kitchen SYNTHROID 75 MCG tablet TAKE ONE TABLET BY MOUTH ONCE DAILY  90 tablet  1  . traMADol (ULTRAM) 50 MG tablet Take 1 tablet (50 mg total) by mouth every 6 (six) hours as needed.  60 tablet  2  . vitamin C (ASCORBIC ACID) 500 MG tablet Take 500 mg by mouth daily.      Marland Kitchen zolpidem (AMBIEN) 5 MG tablet Take 1 tablet (5 mg total) by mouth at bedtime as needed for sleep.  15 tablet  1   No current facility-administered medications on file prior to visit.    There were no vitals taken for this visit.      Review of Systems  Constitutional: Negative for fever, appetite change, fatigue and unexpected weight change.  HENT: Positive for hearing loss. Negative for congestion, dental problem, ear pain, mouth sores, nosebleeds, sinus pressure, sore throat, tinnitus, trouble swallowing and voice change.   Eyes: Negative for photophobia, pain, redness and visual disturbance.  Respiratory: Negative for cough, chest tightness and shortness of breath.   Cardiovascular: Negative for chest pain, palpitations and leg swelling.  Gastrointestinal: Negative for nausea, vomiting, abdominal pain, diarrhea, constipation, blood in stool, abdominal distention and rectal pain.  Genitourinary: Negative for dysuria, urgency, frequency, hematuria, flank pain, vaginal bleeding, vaginal discharge, difficulty urinating, genital sores, vaginal pain, menstrual problem and pelvic pain.  Musculoskeletal:  Negative for arthralgias, back pain and neck stiffness.  Skin: Negative for rash.  Neurological: Negative for dizziness, syncope, speech difficulty, weakness, light-headedness, numbness and headaches.  Hematological: Negative for adenopathy. Does not bruise/bleed easily.  Psychiatric/Behavioral: Negative for suicidal ideas, behavioral problems, self-injury, dysphoric mood and agitation. The patient is not nervous/anxious.        Objective:   Physical Exam  Constitutional: She is oriented to person, place, and time. She appears well-developed and well-nourished.  HENT:  Head: Normocephalic and atraumatic.  Right Ear: External ear normal.  Left Ear: External ear normal.  Mouth/Throat: Oropharynx is clear and moist.  Hearing aid present on the left  Low hanging soft palate  Eyes: Conjunctivae and EOM are normal.  Neck: Normal range of motion. Neck supple. No JVD present. No thyromegaly present.  Cardiovascular: Normal rate, normal heart sounds and intact distal pulses.   No murmur heard. Irregular heart rate around 115  Pulmonary/Chest: Effort normal and breath sounds normal. She has no wheezes. She has no rales.  Kyphosis  Abdominal: Soft. Bowel sounds are normal. She exhibits no distension and no mass. There is no tenderness. There is no rebound and no guarding.  Abdominal scar mildly obese no masses  Musculoskeletal: Normal range of motion. She exhibits no edema and no tenderness.  Neurological: She is alert and oriented to person, place, and time. She has normal reflexes. No cranial nerve deficit. She exhibits normal muscle tone. Coordination normal.  Skin: Skin is warm and dry. No rash noted.  Psychiatric: She has a normal mood and affect. Her behavior is normal.    5 mm slightly raised papule right outer forearm. This was treated with cryotherapy without difficulty      Assessment & Plan:   Preventive health exam Hypothyroidism. We'll check a TSH dyslipidemia we'll check a  lipid profile Hypertension stable New onset atrial fib;  we'll check a 2-D echocardiogram.  Place on diltiazem and Xarelto anticoagulation (lives in [place]).  We'll check with insurance for coverage  Status post  partial mastectomy for lobular carcinoma in situ.  Status post breast biopsy March 2015   Recheck 2 weeks

## 2013-07-19 ENCOUNTER — Telehealth: Payer: Self-pay | Admitting: Internal Medicine

## 2013-07-19 ENCOUNTER — Ambulatory Visit (HOSPITAL_COMMUNITY): Payer: Medicare Other | Attending: Internal Medicine | Admitting: Radiology

## 2013-07-19 DIAGNOSIS — I059 Rheumatic mitral valve disease, unspecified: Secondary | ICD-10-CM | POA: Insufficient documentation

## 2013-07-19 DIAGNOSIS — R5381 Other malaise: Secondary | ICD-10-CM | POA: Insufficient documentation

## 2013-07-19 DIAGNOSIS — I079 Rheumatic tricuspid valve disease, unspecified: Secondary | ICD-10-CM | POA: Diagnosis not present

## 2013-07-19 DIAGNOSIS — I10 Essential (primary) hypertension: Secondary | ICD-10-CM | POA: Diagnosis not present

## 2013-07-19 DIAGNOSIS — R0609 Other forms of dyspnea: Secondary | ICD-10-CM | POA: Diagnosis not present

## 2013-07-19 DIAGNOSIS — R5383 Other fatigue: Secondary | ICD-10-CM | POA: Diagnosis not present

## 2013-07-19 DIAGNOSIS — E785 Hyperlipidemia, unspecified: Secondary | ICD-10-CM | POA: Diagnosis not present

## 2013-07-19 DIAGNOSIS — I4891 Unspecified atrial fibrillation: Secondary | ICD-10-CM | POA: Diagnosis not present

## 2013-07-19 DIAGNOSIS — I499 Cardiac arrhythmia, unspecified: Secondary | ICD-10-CM

## 2013-07-19 DIAGNOSIS — R0989 Other specified symptoms and signs involving the circulatory and respiratory systems: Secondary | ICD-10-CM | POA: Insufficient documentation

## 2013-07-19 NOTE — Progress Notes (Signed)
Echocardiogram performed.  

## 2013-07-19 NOTE — Telephone Encounter (Signed)
Relevant patient education assigned to patient using Emmi. ° °

## 2013-07-21 ENCOUNTER — Telehealth: Payer: Self-pay | Admitting: Internal Medicine

## 2013-07-21 NOTE — Telephone Encounter (Signed)
Please advise, pt calling for Echo results.

## 2013-07-21 NOTE — Telephone Encounter (Signed)
Pt would like echocardiogram results

## 2013-07-21 NOTE — Telephone Encounter (Signed)
Please call/notify patient that lab/test/procedure is normal 

## 2013-07-22 NOTE — Telephone Encounter (Signed)
Spoke to pt told her Echocardiogram was normal per Dr. Raliegh Ip. Pt verbalized understanding and asked if she should continue medications. Told pt yes, please continue medications as prescribed and keep follow up appointment on May 19. Pt verbalized understanding.

## 2013-08-02 ENCOUNTER — Telehealth: Payer: Self-pay | Admitting: Internal Medicine

## 2013-08-02 ENCOUNTER — Encounter: Payer: Self-pay | Admitting: Internal Medicine

## 2013-08-02 ENCOUNTER — Ambulatory Visit (INDEPENDENT_AMBULATORY_CARE_PROVIDER_SITE_OTHER): Payer: Medicare Other | Admitting: Internal Medicine

## 2013-08-02 ENCOUNTER — Other Ambulatory Visit: Payer: Self-pay | Admitting: *Deleted

## 2013-08-02 VITALS — BP 110/70 | HR 120 | Temp 98.5°F | Resp 18 | Ht 59.5 in | Wt 131.0 lb

## 2013-08-02 DIAGNOSIS — I4891 Unspecified atrial fibrillation: Secondary | ICD-10-CM

## 2013-08-02 DIAGNOSIS — E039 Hypothyroidism, unspecified: Secondary | ICD-10-CM

## 2013-08-02 DIAGNOSIS — I10 Essential (primary) hypertension: Secondary | ICD-10-CM | POA: Diagnosis not present

## 2013-08-02 MED ORDER — DILTIAZEM HCL ER 120 MG PO CP24: ORAL_CAPSULE | ORAL | Status: DC

## 2013-08-02 MED ORDER — TRAMADOL HCL 50 MG PO TABS: 50.0000 mg | ORAL_TABLET | Freq: Four times a day (QID) | ORAL | Status: DC | PRN

## 2013-08-02 MED ORDER — ZOLPIDEM TARTRATE 5 MG PO TABS: 5.0000 mg | ORAL_TABLET | Freq: Every evening | ORAL | Status: DC | PRN

## 2013-08-02 MED ORDER — RIVAROXABAN 20 MG PO TABS: 20.0000 mg | ORAL_TABLET | Freq: Every day | ORAL | Status: DC

## 2013-08-02 NOTE — Progress Notes (Signed)
Pre-visit discussion using our clinic review tool. No additional management support is needed unless otherwise documented below in the visit note.  

## 2013-08-02 NOTE — Telephone Encounter (Signed)
Noted  

## 2013-08-02 NOTE — Patient Instructions (Signed)
Cardiology referral as discussed Increase diltiazem to 2 tablets daily  Discontinue losartan/hydrochlorothiazide

## 2013-08-02 NOTE — Telephone Encounter (Signed)
Pt was given samples of xarelto 20 mg and pt is calling to let md know her insurance will cover xarelto. [facility] in [place]. Pt does not need rx yet

## 2013-08-02 NOTE — Progress Notes (Signed)
Subjective:    Patient ID: Christine Perez, female    DOB: [birth year], 80 y.o.   MRN: [id number]  HPI 80 year old patient who is seen today in followup.  She was seen 2 weeks ago for an annual preventive health examination and was noted to have atrial fibrillation.  She is on diltiazem 120 mg daily, as well as anticoagulation.  For the past 2 months, she has felt more fatigued with less exercise capacity.  2-D echocardiogram was normal.  Screening lab including TSH also unremarkable.  She generally feels well today except for some fatigue.  Past Medical History  Diagnosis Date  . CARDIAC ARRHYTHMIA 01/08/2010  . COLONIC POLYPS, HX OF 10/09/2006  . HYPERLIPIDEMIA 10/09/2006  . HYPERTENSION 10/09/2006  . HYPOTHYROIDISM 10/09/2006  . LASSITUDE 08/15/2009  . OSTEOPOROSIS 10/09/2006  . VERTIGO 08/07/2009  . Wears glasses   . Wears hearing aid     left    History   Social History  . Marital Status: Widowed    Spouse Name: N/A    Number of Children: N/A  . Years of Education: N/A   Occupational History  . Not on file.   Social History Main Topics  . Smoking status: Never Smoker   . Smokeless tobacco: Never Used  . Alcohol Use: No  . Drug Use: No  . Sexual Activity: Not on file   Other Topics Concern  . Not on file   Social History Narrative  . No narrative on file    Past Surgical History  Procedure Laterality Date  . Appendectomy    . Cholecystectomy    . Abdominal hysterectomy    . Breast surgery      left - lumpectomy  . Tonsillectomy    . Cataract extraction    . Partial mastectomy with needle localization Left 06/29/2012    Procedure: PARTIAL MASTECTOMY WITH NEEDLE LOCALIZATION;  Surgeon: Adin Hector, MD;  Location: [facility];  Service: General;  Laterality: Left;    No family history on file.  Allergies  Allergen Reactions  . Codeine Shortness Of Breath    Difficulty breathing    Current Outpatient Prescriptions on File Prior to Visit    Medication Sig Dispense Refill  . alendronate (FOSAMAX) 70 MG tablet Take 1 tablet (70 mg total) by mouth every 7 (seven) days. Take with a full glass of water on an empty stomach.  4 tablet  11  . calcium-vitamin D (OSCAL WITH D) 500-200 MG-UNIT per tablet Take 1 tablet by mouth daily.      Marland Kitchen diltiazem (DILACOR XR) 120 MG 24 hr capsule Take 1 capsule (120 mg total) by mouth daily.  90 capsule  6  . losartan-hydrochlorothiazide (HYZAAR) 100-12.5 MG per tablet TAKE ONE TABLET BY MOUTH EVERY DAY  90 tablet  3  . meclizine (ANTIVERT) 25 MG tablet Take 1 tablet (25 mg total) by mouth 3 (three) times daily as needed.  30 tablet  4  . Multiple Vitamin (MULTIVITAMIN) tablet Take 1 tablet by mouth daily.      . rivaroxaban (XARELTO) 20 MG TABS tablet Take 1 tablet (20 mg total) by mouth daily with supper.  30 tablet    . SYNTHROID 75 MCG tablet TAKE ONE TABLET BY MOUTH ONCE DAILY  90 tablet  1  . vitamin C (ASCORBIC ACID) 500 MG tablet Take 500 mg by mouth daily.       No current facility-administered medications on file prior to visit.  BP 110/70  Pulse 120  Temp(Src) 98.5 F (36.9 C) (Oral)  Resp 18  Ht 4' 11.5" (1.511 m)  Wt 131 lb (59.421 kg)  BMI 26.03 kg/m2  SpO2 96%       Review of Systems  Constitutional: Positive for fatigue.  HENT: Negative for congestion, dental problem, hearing loss, rhinorrhea, sinus pressure, sore throat and tinnitus.   Eyes: Negative for pain, discharge and visual disturbance.  Respiratory: Negative for cough and shortness of breath.   Cardiovascular: Negative for chest pain, palpitations and leg swelling.  Gastrointestinal: Negative for nausea, vomiting, abdominal pain, diarrhea, constipation, blood in stool and abdominal distention.  Genitourinary: Negative for dysuria, urgency, frequency, hematuria, flank pain, vaginal bleeding, vaginal discharge, difficulty urinating, vaginal pain and pelvic pain.  Musculoskeletal: Negative for arthralgias, gait  problem and joint swelling.  Skin: Negative for rash.  Neurological: Negative for dizziness, syncope, speech difficulty, weakness, numbness and headaches.  Hematological: Negative for adenopathy.  Psychiatric/Behavioral: Negative for behavioral problems, dysphoric mood and agitation. The patient is not nervous/anxious.        Objective:   Physical Exam  Constitutional: She is oriented to person, place, and time. She appears well-developed and well-nourished.  HENT:  Head: Normocephalic.  Right Ear: External ear normal.  Left Ear: External ear normal.  Mouth/Throat: Oropharynx is clear and moist.  Eyes: Conjunctivae and EOM are normal. Pupils are equal, round, and reactive to light.  Neck: Normal range of motion. Neck supple. No thyromegaly present.  Cardiovascular: Normal rate, normal heart sounds and intact distal pulses.   No murmur heard. Irregular rhythm, with a rate of 120  Pulmonary/Chest: Effort normal and breath sounds normal.  Abdominal: Soft. Bowel sounds are normal. She exhibits no mass. There is no tenderness.  Musculoskeletal: Normal range of motion.  Lymphadenopathy:    She has no cervical adenopathy.  Neurological: She is alert and oriented to person, place, and time.  Skin: Skin is warm and dry. No rash noted.  Psychiatric: She has a normal mood and affect. Her behavior is normal.          Assessment & Plan:   Persistent atrial fibrillation.  Will increase diltiazem for better rate control Fatigue possibly secondary to recently diagnosed atrial fibrillation. Continue Xarelto anticoagulation.  Patient will check on coverage  Cardiology referral  Discontinue losartan, hydrochlorothiazide

## 2013-08-03 ENCOUNTER — Encounter: Payer: Self-pay | Admitting: Cardiology

## 2013-08-03 ENCOUNTER — Encounter (HOSPITAL_COMMUNITY): Payer: Self-pay | Admitting: Pharmacy Technician

## 2013-08-03 ENCOUNTER — Ambulatory Visit (INDEPENDENT_AMBULATORY_CARE_PROVIDER_SITE_OTHER): Payer: Medicare Other | Admitting: Cardiology

## 2013-08-03 VITALS — BP 130/78 | HR 110 | Ht 59.0 in | Wt 128.0 lb

## 2013-08-03 DIAGNOSIS — Z7901 Long term (current) use of anticoagulants: Secondary | ICD-10-CM | POA: Diagnosis not present

## 2013-08-03 DIAGNOSIS — I4892 Unspecified atrial flutter: Secondary | ICD-10-CM | POA: Diagnosis not present

## 2013-08-03 DIAGNOSIS — R5381 Other malaise: Secondary | ICD-10-CM | POA: Diagnosis not present

## 2013-08-03 DIAGNOSIS — I4891 Unspecified atrial fibrillation: Secondary | ICD-10-CM

## 2013-08-03 DIAGNOSIS — R5383 Other fatigue: Secondary | ICD-10-CM

## 2013-08-03 LAB — BASIC METABOLIC PANEL
BUN: 20 mg/dL (ref 6–23)
CHLORIDE: 102 meq/L (ref 96–112)
CO2: 32 mEq/L (ref 19–32)
CREATININE: 0.9 mg/dL (ref 0.4–1.2)
Calcium: 11.5 mg/dL — ABNORMAL HIGH (ref 8.4–10.5)
GFR: 61.51 mL/min (ref >60.00)
Glucose, Bld: 96 mg/dL (ref 70–99)
Potassium: 4.5 mEq/L (ref 3.5–5.1)
SODIUM: 141 meq/L (ref 135–145)

## 2013-08-03 NOTE — Progress Notes (Signed)
[facility]. [address], [address], [place]  [address] Phone: [phone] Fax:  [phone]  Date:  08/03/2013   ID:  Christine, Perez 1925/07/29, MRN [id number]  PCP:  Nyoka Cowden, MD   History of Present Illness: ALIYYAH Perez is a 80 y.o. female here for the evaluation of atrial fibrillation/ atrial flutter. In May fourth 2015, she was discovered to be in atrial fibrillation. Felt unusually tired. Always up normally. No energy. Began feeling this lack of energy since last summer. TSH normal. Currently on diltiazem 120 mg twice a day as well as anticoagulation. Echocardiogram on 07/19/13 showed normal ejection fraction, mildly dilated left atrium, moderate tricuspid regurgitation. Potassium 4.2, creatinine 0.8, hemoglobin 13.9, LDL 133, TSH 0.87.  Started Xarelto on 07/18/13. No bleeding.   Wt Readings from Last 3 Encounters:  08/03/13 128 lb (58.06 kg)  08/02/13 131 lb (59.421 kg)  07/18/13 131 lb (59.421 kg)     Past Medical History  Diagnosis Date  . CARDIAC ARRHYTHMIA 01/08/2010  . COLONIC POLYPS, HX OF 10/09/2006  . HYPERLIPIDEMIA 10/09/2006  . HYPERTENSION 10/09/2006  . HYPOTHYROIDISM 10/09/2006  . LASSITUDE 08/15/2009  . OSTEOPOROSIS 10/09/2006  . VERTIGO 08/07/2009  . Wears glasses   . Wears hearing aid     left    Past Surgical History  Procedure Laterality Date  . Appendectomy    . Cholecystectomy    . Abdominal hysterectomy    . Breast surgery      left - lumpectomy  . Tonsillectomy    . Cataract extraction    . Partial mastectomy with needle localization Left 06/29/2012    Procedure: PARTIAL MASTECTOMY WITH NEEDLE LOCALIZATION;  Surgeon: Adin Hector, MD;  Location: [facility];  Service: General;  Laterality: Left;    Current Outpatient Prescriptions  Medication Sig Dispense Refill  . alendronate (FOSAMAX) 70 MG tablet Take 1 tablet (70 mg total) by mouth every 7 (seven) days. Take with a full glass of water on an  empty stomach.  4 tablet  11  . calcium-vitamin D (OSCAL WITH D) 500-200 MG-UNIT per tablet Take 1 tablet by mouth daily.      Marland Kitchen diltiazem (DILACOR XR) 120 MG 24 hr capsule 2 tablets daily  90 capsule  6  . meclizine (ANTIVERT) 25 MG tablet Take 1 tablet (25 mg total) by mouth 3 (three) times daily as needed.  30 tablet  4  . Multiple Vitamin (MULTIVITAMIN) tablet Take 1 tablet by mouth daily.      . rivaroxaban (XARELTO) 20 MG TABS tablet Take 1 tablet (20 mg total) by mouth daily with supper.  30 tablet  4  . SYNTHROID 75 MCG tablet TAKE ONE TABLET BY MOUTH ONCE DAILY  90 tablet  1  . traMADol (ULTRAM) 50 MG tablet Take 1 tablet (50 mg total) by mouth every 6 (six) hours as needed.  60 tablet  5  . vitamin C (ASCORBIC ACID) 500 MG tablet Take 500 mg by mouth daily.      Marland Kitchen zolpidem (AMBIEN) 5 MG tablet Take 1 tablet (5 mg total) by mouth at bedtime as needed for sleep.  15 tablet  2   No current facility-administered medications for this visit.    Allergies:    Allergies  Allergen Reactions  . Codeine Shortness Of Breath    Difficulty breathing    Social History:  The patient  reports that she has never smoked. She has  never used smokeless tobacco. She reports that she does not drink alcohol or use illicit drugs.   No family history on file. Father died of MI. Sister died of stroke at 21.  ROS:  Please see the history of present illness.   Increased fatigue, no strokelike symptoms, no bleeding. Left groin pain.  All other systems reviewed and negative.   PHYSICAL EXAM: VS:  BP 130/78  Pulse 110  Ht 4\' 11"  (1.499 m)  Wt 128 lb (58.06 kg)  BMI 25.84 kg/m2 Well nourished, well developed, in no acute distress, elderly HEENT: normal, Mason/AT, EOMI Neck: no JVD, normal carotid upstroke, no bruit Cardiac:  normal S1, S2; tachycardic, regular; no murmur Lungs:  clear to auscultation bilaterally, no wheezing, rhonchi or rales Abd: soft, nontender, no hepatomegaly, no bruits Ext: no  edema, 2+ distal pulses Skin: warm and dry GU: deferred Neuro: no focal abnormalities noted, AAO x 3  EKG:  08/03/13-atrial flutter with variable conduction, heart rate 110. Echocardiogram: 07/19/13-normal ejection fraction, moderate TR, mild left atrial enlargement     ASSESSMENT AND PLAN:  1. Atrial fibrillation/flutter-we have discussed risks of stroke, appropriate use of anticoagulation. Given her sense of fatigue, lack of energy we will attempt cardioversion. Discussed risks and benefits of the procedure including stroke, pacemaker. She is willing to proceed. Her daughter was present for discussion. I think this would be helpful to help determine whether or not her atrial fibrillation/flutter is playing a role in her symptomatology. TSH is normal. Ejection fraction normal. Electrolytes are normal. Continue for now with anticoagulation and diltiazem 120 twice daily. CHADSVASc-3 (Age 25, Female) 2. Followup in 3 weeks. We will continue to follow closely.  Signed, Candee Furbish, MD [facility]  08/03/2013 11:45 AM

## 2013-08-03 NOTE — Patient Instructions (Signed)
.  Your physician recommends that you continue on your current medications as directed. Please refer to the Current Medication list given to you today.  Lab today : [facility]  Your physician has recommended that you have a Cardioversion (DCCV). Electrical Cardioversion uses a jolt of electricity to your heart either through paddles or wired patches attached to your chest. This is a controlled, usually prescheduled, procedure. Defibrillation is done under light anesthesia in the hospital, and you usually go home the day of the procedure. This is done to get your heart back into a normal rhythm. You are not awake for the procedure. Please see the instruction sheet given to you today.  Your physician recommends that you schedule a follow-up appointment on 09/05/13 @ 9:45am with an EKG

## 2013-08-09 ENCOUNTER — Encounter (HOSPITAL_COMMUNITY): Payer: Self-pay | Admitting: Certified Registered Nurse Anesthetist

## 2013-08-09 ENCOUNTER — Encounter (HOSPITAL_COMMUNITY)
Admission: RE | Disposition: A | Discharge status: Home / Self Care | Payer: Medicare Other | Source: Ambulatory Visit | Attending: Cardiology

## 2013-08-09 ENCOUNTER — Ambulatory Visit (HOSPITAL_COMMUNITY): Payer: Medicare Other | Admitting: Certified Registered Nurse Anesthetist

## 2013-08-09 ENCOUNTER — Encounter (HOSPITAL_COMMUNITY): Payer: Medicare Other | Admitting: Certified Registered Nurse Anesthetist

## 2013-08-09 ENCOUNTER — Ambulatory Visit (HOSPITAL_COMMUNITY)
Admission: RE | Admit: 2013-08-09 | Discharge: 2013-08-09 | Disposition: A | Discharge status: Home / Self Care | Payer: Medicare Other | Source: Ambulatory Visit | Attending: Cardiology | Admitting: Cardiology

## 2013-08-09 DIAGNOSIS — Z79899 Other long term (current) drug therapy: Secondary | ICD-10-CM | POA: Diagnosis not present

## 2013-08-09 DIAGNOSIS — Z885 Allergy status to narcotic agent status: Secondary | ICD-10-CM | POA: Diagnosis not present

## 2013-08-09 DIAGNOSIS — I4892 Unspecified atrial flutter: Secondary | ICD-10-CM

## 2013-08-09 DIAGNOSIS — M810 Age-related osteoporosis without current pathological fracture: Secondary | ICD-10-CM | POA: Insufficient documentation

## 2013-08-09 DIAGNOSIS — E785 Hyperlipidemia, unspecified: Secondary | ICD-10-CM | POA: Diagnosis not present

## 2013-08-09 DIAGNOSIS — I079 Rheumatic tricuspid valve disease, unspecified: Secondary | ICD-10-CM | POA: Diagnosis not present

## 2013-08-09 DIAGNOSIS — Z860100 Personal history of colon polyps, unspecified: Secondary | ICD-10-CM | POA: Insufficient documentation

## 2013-08-09 DIAGNOSIS — E039 Hypothyroidism, unspecified: Secondary | ICD-10-CM | POA: Diagnosis not present

## 2013-08-09 DIAGNOSIS — I10 Essential (primary) hypertension: Secondary | ICD-10-CM | POA: Insufficient documentation

## 2013-08-09 DIAGNOSIS — I4891 Unspecified atrial fibrillation: Secondary | ICD-10-CM | POA: Insufficient documentation

## 2013-08-09 HISTORY — PX: CARDIOVERSION: SHX1299

## 2013-08-09 SURGERY — CARDIOVERSION
Anesthesia: Monitor Anesthesia Care

## 2013-08-09 MED ORDER — LIDOCAINE HCL (CARDIAC) 20 MG/ML IV SOLN
INTRAVENOUS | Status: DC | PRN
  Administered 2013-08-09: 60 mg via INTRAVENOUS

## 2013-08-09 MED ORDER — SODIUM CHLORIDE 0.9 % IV SOLN
INTRAVENOUS | Status: DC | PRN
  Administered 2013-08-09: 12:00:00 via INTRAVENOUS

## 2013-08-09 MED ORDER — SODIUM CHLORIDE 0.9 % IV SOLN
INTRAVENOUS | Status: DC
  Administered 2013-08-09: 12:00:00 via INTRAVENOUS

## 2013-08-09 MED ORDER — PROPOFOL 10 MG/ML IV BOLUS
INTRAVENOUS | Status: DC | PRN
  Administered 2013-08-09: 100 mg via INTRAVENOUS

## 2013-08-09 NOTE — H&P (View-Only) (Signed)
[facility]. [address], [address], [place]  [address] Phone: [phone] Fax:  [phone]  Date:  08/03/2013   ID:  Christine, Perez 08-Jul-1925, MRN [id number]  PCP:  Nyoka Cowden, MD   History of Present Illness: Christine Perez is a 80 y.o. female here for the evaluation of atrial fibrillation/ atrial flutter. In May fourth 2015, she was discovered to be in atrial fibrillation. Felt unusually tired. Always up normally. No energy. Began feeling this lack of energy since last summer. TSH normal. Currently on diltiazem 120 mg twice a day as well as anticoagulation. Echocardiogram on 07/19/13 showed normal ejection fraction, mildly dilated left atrium, moderate tricuspid regurgitation. Potassium 4.2, creatinine 0.8, hemoglobin 13.9, LDL 133, TSH 0.87.  Started Xarelto on 07/18/13. No bleeding.   Wt Readings from Last 3 Encounters:  08/03/13 128 lb (58.06 kg)  08/02/13 131 lb (59.421 kg)  07/18/13 131 lb (59.421 kg)     Past Medical History  Diagnosis Date  . CARDIAC ARRHYTHMIA 01/08/2010  . COLONIC POLYPS, HX OF 10/09/2006  . HYPERLIPIDEMIA 10/09/2006  . HYPERTENSION 10/09/2006  . HYPOTHYROIDISM 10/09/2006  . LASSITUDE 08/15/2009  . OSTEOPOROSIS 10/09/2006  . VERTIGO 08/07/2009  . Wears glasses   . Wears hearing aid     left    Past Surgical History  Procedure Laterality Date  . Appendectomy    . Cholecystectomy    . Abdominal hysterectomy    . Breast surgery      left - lumpectomy  . Tonsillectomy    . Cataract extraction    . Partial mastectomy with needle localization Left 06/29/2012    Procedure: PARTIAL MASTECTOMY WITH NEEDLE LOCALIZATION;  Surgeon: Adin Hector, MD;  Location: [facility];  Service: General;  Laterality: Left;    Current Outpatient Prescriptions  Medication Sig Dispense Refill  . alendronate (FOSAMAX) 70 MG tablet Take 1 tablet (70 mg total) by mouth every 7 (seven) days. Take with a full glass of water on an  empty stomach.  4 tablet  11  . calcium-vitamin D (OSCAL WITH D) 500-200 MG-UNIT per tablet Take 1 tablet by mouth daily.      Marland Kitchen diltiazem (DILACOR XR) 120 MG 24 hr capsule 2 tablets daily  90 capsule  6  . meclizine (ANTIVERT) 25 MG tablet Take 1 tablet (25 mg total) by mouth 3 (three) times daily as needed.  30 tablet  4  . Multiple Vitamin (MULTIVITAMIN) tablet Take 1 tablet by mouth daily.      . rivaroxaban (XARELTO) 20 MG TABS tablet Take 1 tablet (20 mg total) by mouth daily with supper.  30 tablet  4  . SYNTHROID 75 MCG tablet TAKE ONE TABLET BY MOUTH ONCE DAILY  90 tablet  1  . traMADol (ULTRAM) 50 MG tablet Take 1 tablet (50 mg total) by mouth every 6 (six) hours as needed.  60 tablet  5  . vitamin C (ASCORBIC ACID) 500 MG tablet Take 500 mg by mouth daily.      Marland Kitchen zolpidem (AMBIEN) 5 MG tablet Take 1 tablet (5 mg total) by mouth at bedtime as needed for sleep.  15 tablet  2   No current facility-administered medications for this visit.    Allergies:    Allergies  Allergen Reactions  . Codeine Shortness Of Breath    Difficulty breathing    Social History:  The patient  reports that she has never smoked. She has  never used smokeless tobacco. She reports that she does not drink alcohol or use illicit drugs.   No family history on file. Father died of MI. Sister died of stroke at 21.  ROS:  Please see the history of present illness.   Increased fatigue, no strokelike symptoms, no bleeding. Left groin pain.  All other systems reviewed and negative.   PHYSICAL EXAM: VS:  BP 130/78  Pulse 110  Ht 4\' 11"  (1.499 m)  Wt 128 lb (58.06 kg)  BMI 25.84 kg/m2 Well nourished, well developed, in no acute distress, elderly HEENT: normal, Mason/AT, EOMI Neck: no JVD, normal carotid upstroke, no bruit Cardiac:  normal S1, S2; tachycardic, regular; no murmur Lungs:  clear to auscultation bilaterally, no wheezing, rhonchi or rales Abd: soft, nontender, no hepatomegaly, no bruits Ext: no  edema, 2+ distal pulses Skin: warm and dry GU: deferred Neuro: no focal abnormalities noted, AAO x 3  EKG:  08/03/13-atrial flutter with variable conduction, heart rate 110. Echocardiogram: 07/19/13-normal ejection fraction, moderate TR, mild left atrial enlargement     ASSESSMENT AND PLAN:  1. Atrial fibrillation/flutter-we have discussed risks of stroke, appropriate use of anticoagulation. Given her sense of fatigue, lack of energy we will attempt cardioversion. Discussed risks and benefits of the procedure including stroke, pacemaker. She is willing to proceed. Her daughter was present for discussion. I think this would be helpful to help determine whether or not her atrial fibrillation/flutter is playing a role in her symptomatology. TSH is normal. Ejection fraction normal. Electrolytes are normal. Continue for now with anticoagulation and diltiazem 120 twice daily. CHADSVASc-3 (Age 25, Female) 2. Followup in 3 weeks. We will continue to follow closely.  Signed, Candee Furbish, MD [facility]  08/03/2013 11:45 AM

## 2013-08-09 NOTE — Anesthesia Procedure Notes (Signed)
Procedure Name: MAC Date/Time: 08/09/2013 12:11 PM Performed by: Ned Grace Pre-anesthesia Checklist: Patient identified, Timeout performed, Emergency Drugs available, Suction available and Patient being monitored Patient Re-evaluated:Patient Re-evaluated prior to inductionOxygen Delivery Method: Circle system utilized Preoxygenation: Pre-oxygenation with 100% oxygen Intubation Type: IV induction Ventilation: Mask ventilation without difficulty

## 2013-08-09 NOTE — CV Procedure (Signed)
    Electrical Cardioversion Procedure Note Christine Perez [id number] 1925-08-23  Procedure: Electrical Cardioversion Indications:  Atrial Flutter  Time Out: Verified patient identification, verified procedure,medications/allergies/relevent history reviewed, required imaging and test results available.  Performed  Procedure Details  The patient was NPO after midnight. Anesthesia was administered at the beside  by Dr.Hodier with 100mg  of propofol.  Cardioversion was performed with synchronized biphasic defibrillation via AP pads with 120 joules.  1 attempt(s) were performed.  The patient converted to normal sinus rhythm. The patient tolerated the procedure well   IMPRESSION:  Successful cardioversion of atrial flutter. PAC's noted post cardioversion. Discussed with daughter that there is a possibility of return of AFIB. If this occurs, proceed with rate control strategy.     Candee Furbish 08/09/2013, 12:19 PM

## 2013-08-09 NOTE — Transfer of Care (Signed)
Immediate Anesthesia Transfer of Care Note  Patient: Christine Perez  Procedure(s) Performed: Procedure(s): CARDIOVERSION (N/A)  Patient Location: PACU  Anesthesia Type:General  Level of Consciousness: awake, oriented, patient cooperative and lethargic  Airway & Oxygen Therapy: Patient Spontanous Breathing and Patient connected to nasal cannula oxygen  Post-op Assessment: Report given to PACU RN, Post -op Vital signs reviewed and stable and Patient moving all extremities  Post vital signs: Reviewed and stable  Complications: No apparent anesthesia complications

## 2013-08-09 NOTE — Interval H&P Note (Signed)
History and Physical Interval Note:  08/09/2013 12:01 PM  Christine Perez  has presented today for surgery, with the diagnosis of afib  The various methods of treatment have been discussed with the patient and family. After consideration of risks, benefits and other options for treatment, the patient has consented to  Procedure(s): CARDIOVERSION (N/A) as a surgical intervention .  The patient's history has been reviewed, patient examined, no change in status, stable for surgery.  I have reviewed the patient's chart and labs.  Questions were answered to the patient's satisfaction.     [employer]

## 2013-08-09 NOTE — Discharge Instructions (Signed)
Electrical Cardioversion, Care After °Refer to this sheet in the next few weeks. These instructions provide you with information on caring for yourself after your procedure. Your health care provider may also give you more specific instructions. Your treatment has been planned according to current medical practices, but problems sometimes occur. Call your health care provider if you have any problems or questions after your procedure. °WHAT TO EXPECT AFTER THE PROCEDURE °After your procedure, it is typical to have the following sensations: °· Some redness on the skin where the shocks were delivered. If this is tender, a sunburn lotion or hydrocortisone cream may help. °· Possible return of an abnormal heart rhythm within hours or days after the procedure. °HOME CARE INSTRUCTIONS °· Only take medicine as directed by your health care provider. Be sure you understand how and when to take your medicine. °· Learn how to feel your pulse and check it often. °· Limit your activity for 48 hours after the procedure or as directed. °· Avoid or minimize caffeine and other stimulants as directed. °SEEK MEDICAL CARE IF: °· You feel like your heart is beating too fast or your pulse is not regular. °· You have any questions about your medicines. °· You have bleeding that will not stop. °SEEK IMMEDIATE MEDICAL CARE IF: °· You are dizzy or feel faint. °· It is hard to breathe or you feel short of breath. °· There is a change in discomfort in your chest. °· Your speech is slurred or you have trouble moving an arm or leg on one side of your body. °· You get a serious muscle cramp that does not go away. °· Your fingers or toes turn cold or blue. °MAKE SURE YOU:  °· Understand these instructions.   °· Will watch your condition.   °· Will get help right away if you are not doing well or get worse. °Document Released: 12/22/2012 Document Reviewed: 09/15/2012 °[employer]® Patient Information ©2014 [employer]. ° °Monitored Anesthesia Care   °Monitored anesthesia care is an anesthesia service for a medical procedure. Anesthesia is the loss of the ability to feel pain. It is produced by medications called anesthetics. It may affect a small area of your body (local anesthesia), a large area of your body (regional anesthesia), or your entire body (general anesthesia). The need for monitored anesthesia care depends your procedure, your condition, and the potential need for regional or general anesthesia. It is often provided during procedures where:  °· General anesthesia may be needed if there are complications. This is because you need special care when you are under general anesthesia.   °· You will be under local or regional anesthesia. This is so that you are able to have higher levels of anesthesia if needed.   °· You will receive calming medications (sedatives). This is especially the case if sedatives are given to put you in a semi-conscious state of relaxation (deep sedation). This is because the amount of sedative needed to produce this state can be hard to predict. Too much of a sedative can produce general anesthesia. °Monitored anesthesia care is performed by one or more caregivers who have special training in all types of anesthesia. You will need to meet with these caregivers before your procedure. During this meeting, they will ask you about your medical history. They will also give you instructions to follow. (For example, you will need to stop eating and drinking before your procedure. You may also need to stop or change medications you are taking.) During your procedure, your   caregivers will stay with you. They will:  °· Watch your condition. This includes watching you blood pressure, breathing, and level of pain.   °· Diagnose and treat problems that occur.   °· Give medications if they are needed. These may include calming medications (sedatives) and anesthetics.   °· Make sure you are comfortable.   °Having monitored anesthesia care  does not necessarily mean that you will be under anesthesia. It does mean that your caregivers will be able to manage anesthesia if you need it or if it occurs. It also means that you will be able to have a different type of anesthesia than you are having if you need it. When your procedure is complete, your caregivers will continue to watch your condition. They will make sure any medications wear off before you are allowed to go home.  °Document Released: 11/27/2004 Document Revised: 06/28/2012 Document Reviewed: 04/14/2012 °ExitCare® Patient Information ©2014 [employer]. ° ° °

## 2013-08-09 NOTE — Anesthesia Preprocedure Evaluation (Signed)
Anesthesia Evaluation  Patient identified by MRN, date of birth, ID band Patient awake    Reviewed: Allergy & Precautions, H&P , NPO status , Patient's Chart, lab work & pertinent test results  Airway Mallampati: II  Neck ROM: full    Dental   Pulmonary neg pulmonary ROS,          Cardiovascular hypertension, + dysrhythmias Atrial Fibrillation     Neuro/Psych    GI/Hepatic   Endo/Other  Hypothyroidism   Renal/GU      Musculoskeletal   Abdominal   Peds  Hematology   Anesthesia Other Findings   Reproductive/Obstetrics                           Anesthesia Physical Anesthesia Plan  ASA: II  Anesthesia Plan: MAC   Post-op Pain Management:    Induction: Intravenous  Airway Management Planned: Mask  Additional Equipment:   Intra-op Plan:   Post-operative Plan:   Informed Consent: I have reviewed the patients History and Physical, chart, labs and discussed the procedure including the risks, benefits and alternatives for the proposed anesthesia with the patient or authorized representative who has indicated his/her understanding and acceptance.     Plan Discussed with: CRNA, Anesthesiologist and Surgeon  Anesthesia Plan Comments:         Anesthesia Quick Evaluation

## 2013-08-10 ENCOUNTER — Encounter (HOSPITAL_COMMUNITY): Payer: Self-pay | Admitting: Cardiology

## 2013-08-10 NOTE — Anesthesia Postprocedure Evaluation (Signed)
Anesthesia Post Note  Patient: Christine Perez  Procedure(s) Performed: Procedure(s) (LRB): CARDIOVERSION (N/A)  Anesthesia type: MAC  Patient location: PACU  Post pain: Pain level controlled and Adequate analgesia  Post assessment: Post-op Vital signs reviewed, Patient's Cardiovascular Status Stable and Respiratory Function Stable  Last Vitals:  Filed Vitals:   08/09/13 1219  BP: 146/69  Pulse: 84  Temp:   Resp: 21    Post vital signs: Reviewed and stable  Level of consciousness: awake, alert  and oriented  Complications: No apparent anesthesia complications

## 2013-09-05 ENCOUNTER — Encounter: Payer: Self-pay | Admitting: Cardiology

## 2013-09-05 ENCOUNTER — Ambulatory Visit (INDEPENDENT_AMBULATORY_CARE_PROVIDER_SITE_OTHER): Payer: Medicare Other | Admitting: Cardiology

## 2013-09-05 VITALS — BP 135/84 | HR 87 | Ht 59.0 in | Wt 127.8 lb

## 2013-09-05 DIAGNOSIS — I4891 Unspecified atrial fibrillation: Secondary | ICD-10-CM | POA: Diagnosis not present

## 2013-09-05 DIAGNOSIS — R109 Unspecified abdominal pain: Secondary | ICD-10-CM | POA: Diagnosis not present

## 2013-09-05 DIAGNOSIS — Z7901 Long term (current) use of anticoagulants: Secondary | ICD-10-CM

## 2013-09-05 DIAGNOSIS — I482 Chronic atrial fibrillation, unspecified: Secondary | ICD-10-CM

## 2013-09-05 MED ORDER — METOPROLOL TARTRATE 25 MG PO TABS: 25.0000 mg | ORAL_TABLET | Freq: Two times a day (BID) | ORAL | Status: DC

## 2013-09-05 NOTE — Patient Instructions (Signed)
START METOPROLOL TARTRATE 25 MG TWICE A DAY, RX SENT TO [facility]  Your physician recommends that you schedule a follow-up appointment in: 1 MONTH WITH PA/NP

## 2013-09-05 NOTE — Progress Notes (Signed)
[facility]. [address], [address], [place]  [address] Phone: [phone] Fax:  [phone]  Date:  09/05/2013   ID:  Christine, Perez 1925-04-30, MRN [id number]  PCP:  Nyoka Cowden, MD   History of Present Illness: Christine Perez is a 80 y.o. female here for the follow up of atrial fibrillation/ atrial flutter. She underwent successful cardioversion on 08/09/13. Previously, on Jul 18 2013, she was discovered to be in atrial fibrillation. Felt unusually tired. Always up normally. No energy. Began feeling this lack of energy since last summer. TSH normal. Currently on diltiazem 120 mg twice a day as well as anticoagulation. Echocardiogram on 07/19/13 showed normal ejection fraction, mildly dilated left atrium, moderate tricuspid regurgitation. Potassium 4.2, creatinine 0.8, hemoglobin 13.9, LDL 133, TSH 0.87.  Started Xarelto on 07/18/13. No bleeding.  Unfortunately, she did not feel any better post cardioversion. We will maintain rate control strategy. She is back in atrial flutter with variable conduction, heart rate 108 beats per minute on 09/05/13.  We have added metoprolol.   Wt Readings from Last 3 Encounters:  09/05/13 127 lb 12.8 oz (57.97 kg)  08/09/13 128 lb (58.06 kg)  08/09/13 128 lb (58.06 kg)     Past Medical History  Diagnosis Date  . CARDIAC ARRHYTHMIA 01/08/2010  . COLONIC POLYPS, HX OF 10/09/2006  . HYPERLIPIDEMIA 10/09/2006  . HYPERTENSION 10/09/2006  . HYPOTHYROIDISM 10/09/2006  . LASSITUDE 08/15/2009  . OSTEOPOROSIS 10/09/2006  . VERTIGO 08/07/2009  . Wears glasses   . Wears hearing aid     left    Past Surgical History  Procedure Laterality Date  . Appendectomy    . Cholecystectomy    . Abdominal hysterectomy    . Breast surgery      left - lumpectomy  . Tonsillectomy    . Cataract extraction    . Partial mastectomy with needle localization Left 06/29/2012    Procedure: PARTIAL MASTECTOMY WITH NEEDLE LOCALIZATION;  Surgeon: Adin Hector, MD;  Location: [place];  Service: General;  Laterality: Left;  . Cardioversion N/A 08/09/2013    Procedure: CARDIOVERSION;  Surgeon: Candee Furbish, MD;  Location: [facility] ENDOSCOPY;  Service: Cardiovascular;  Laterality: N/A;    Current Outpatient Prescriptions  Medication Sig Dispense Refill  . alendronate (FOSAMAX) 70 MG tablet Take 1 tablet (70 mg total) by mouth every 7 (seven) days. Take with a full glass of water on an empty stomach.  4 tablet  11  . calcium-vitamin D (OSCAL WITH D) 500-200 MG-UNIT per tablet Take 1 tablet by mouth 2 (two) times daily.       Marland Kitchen diltiazem (DILACOR XR) 120 MG 24 hr capsule Take 120 mg by mouth 2 (two) times daily.      Marland Kitchen levothyroxine (SYNTHROID, LEVOTHROID) 75 MCG tablet Take 75 mcg by mouth daily before breakfast.      . Multiple Vitamin (MULTIVITAMIN) tablet Take 1 tablet by mouth daily.      . rivaroxaban (XARELTO) 20 MG TABS tablet Take 1 tablet (20 mg total) by mouth daily with supper.  30 tablet  4  . traMADol (ULTRAM) 50 MG tablet Take 50 mg by mouth every 6 (six) hours as needed (pain).      . vitamin C (ASCORBIC ACID) 500 MG tablet Take 500 mg by mouth daily.      Marland Kitchen zolpidem (AMBIEN) 5 MG tablet Take 1 tablet (5 mg total) by mouth at bedtime as needed  for sleep.  15 tablet  2   No current facility-administered medications for this visit.    Allergies:    Allergies  Allergen Reactions  . Codeine Shortness Of Breath    Difficulty breathing    Social History:  The patient  reports that she has never smoked. She has never used smokeless tobacco. She reports that she does not drink alcohol or use illicit drugs.   No family history on file. Father died of MI. Sister died of stroke at 64.  ROS:  Please see the history of present illness.   Increased fatigue, no strokelike symptoms, no bleeding. Left groin pain.  All other systems reviewed and negative.   PHYSICAL EXAM: VS:  BP 135/84  Pulse 87  Ht 4\' 11"  (1.499 m)  Wt 127  lb 12.8 oz (57.97 kg)  BMI 25.80 kg/m2 Well nourished, well developed, in no acute distress, elderly HEENT: normal, [place]/AT, EOMI Neck: no JVD, normal carotid upstroke, no bruit Cardiac: irreg irreg mildly tachy; no murmur Lungs:  clear to auscultation bilaterally, no wheezing, rhonchi or rales Abd: soft, nontender, no hepatomegaly, no bruits Ext: no edema, 2+ distal pulses Skin: warm and dry GU: deferred Neuro: no focal abnormalities noted, AAO x 3  EKG:  08/03/13-atrial flutter with variable conduction, heart rate 110. 09/05/13-atrial fibrillation/flutter rate 108. (Post cardioversion EKG showed sinus rhythm with PACs). Echocardiogram: 07/19/13-normal ejection fraction, moderate TR, mild left atrial enlargement     ASSESSMENT AND PLAN:  1. Atrial fibrillation/flutter-cardioversion 08/09/13 was successful, PACs were noted post cardioversion, however, she is back in atrial flutter/fibrillation on 09/05/13 with an EKG demonstrating ventricular rate of 108 beats per minute. TSH is normal. Ejection fraction normal. Electrolytes are normal. Continue for now with anticoagulation and diltiazem 120 twice daily. CHADSVASc-3 (Age 74, Female). I will start her on metoprolol tartrate 25 mg twice a day to hopefully help improve with her rate control. We will continue with rate control strategy should she failed rhythm control post cardioversion. I explained to she and her daughter that this is a reasonable approach for her. Both she and her daughter states that she did not feel any better post cardioversion hence in sinus rhythm. 2. Abdominal discomfort-this seems to be her main complaint currently. I've asked her to address this with her primary physician. 3. Followup in 1 months APP clinic.   Signed, Candee Furbish, MD [facility]  09/05/2013 10:17 AM

## 2013-09-14 ENCOUNTER — Emergency Department (HOSPITAL_COMMUNITY): Payer: Medicare Other

## 2013-09-14 ENCOUNTER — Encounter (HOSPITAL_COMMUNITY): Payer: Self-pay | Admitting: Emergency Medicine

## 2013-09-14 ENCOUNTER — Emergency Department (HOSPITAL_COMMUNITY)
Admission: EM | Admit: 2013-09-14 | Discharge: 2013-09-14 | Disposition: A | Discharge status: Home / Self Care | Payer: Medicare Other | Attending: Emergency Medicine | Admitting: Emergency Medicine

## 2013-09-14 ENCOUNTER — Telehealth: Payer: Self-pay | Admitting: Internal Medicine

## 2013-09-14 DIAGNOSIS — Z7901 Long term (current) use of anticoagulants: Secondary | ICD-10-CM | POA: Diagnosis not present

## 2013-09-14 DIAGNOSIS — R5381 Other malaise: Secondary | ICD-10-CM | POA: Insufficient documentation

## 2013-09-14 DIAGNOSIS — R5383 Other fatigue: Secondary | ICD-10-CM | POA: Diagnosis not present

## 2013-09-14 DIAGNOSIS — Z79899 Other long term (current) drug therapy: Secondary | ICD-10-CM | POA: Insufficient documentation

## 2013-09-14 DIAGNOSIS — I10 Essential (primary) hypertension: Secondary | ICD-10-CM | POA: Diagnosis not present

## 2013-09-14 DIAGNOSIS — R6889 Other general symptoms and signs: Secondary | ICD-10-CM | POA: Diagnosis not present

## 2013-09-14 DIAGNOSIS — Z8739 Personal history of other diseases of the musculoskeletal system and connective tissue: Secondary | ICD-10-CM | POA: Diagnosis not present

## 2013-09-14 DIAGNOSIS — I4891 Unspecified atrial fibrillation: Secondary | ICD-10-CM | POA: Diagnosis not present

## 2013-09-14 DIAGNOSIS — J449 Chronic obstructive pulmonary disease, unspecified: Secondary | ICD-10-CM | POA: Diagnosis not present

## 2013-09-14 DIAGNOSIS — I498 Other specified cardiac arrhythmias: Secondary | ICD-10-CM | POA: Insufficient documentation

## 2013-09-14 DIAGNOSIS — J9819 Other pulmonary collapse: Secondary | ICD-10-CM | POA: Diagnosis not present

## 2013-09-14 DIAGNOSIS — R001 Bradycardia, unspecified: Secondary | ICD-10-CM

## 2013-09-14 DIAGNOSIS — I4892 Unspecified atrial flutter: Secondary | ICD-10-CM

## 2013-09-14 DIAGNOSIS — Z862 Personal history of diseases of the blood and blood-forming organs and certain disorders involving the immune mechanism: Secondary | ICD-10-CM | POA: Insufficient documentation

## 2013-09-14 DIAGNOSIS — Z860100 Personal history of colon polyps, unspecified: Secondary | ICD-10-CM | POA: Insufficient documentation

## 2013-09-14 DIAGNOSIS — I499 Cardiac arrhythmia, unspecified: Secondary | ICD-10-CM | POA: Diagnosis not present

## 2013-09-14 DIAGNOSIS — Z8639 Personal history of other endocrine, nutritional and metabolic disease: Secondary | ICD-10-CM | POA: Insufficient documentation

## 2013-09-14 HISTORY — DX: Unspecified atrial fibrillation: I48.91

## 2013-09-14 LAB — CBC WITH DIFFERENTIAL/PLATELET
BASOS ABS: 0 10*3/uL (ref 0.0–0.1)
Basophils Relative: 0 % (ref 0–1)
Eosinophils Absolute: 0 10*3/uL (ref 0.0–0.7)
Eosinophils Relative: 1 % (ref 0–5)
HCT: 38.4 % (ref 36.0–46.0)
Hemoglobin: 12.5 g/dL (ref 12.0–15.0)
LYMPHS PCT: 22 % (ref 12–46)
Lymphs Abs: 0.9 10*3/uL (ref 0.7–4.0)
MCH: 30.4 pg (ref 26.0–34.0)
MCHC: 32.6 g/dL (ref 30.0–36.0)
MCV: 93.4 fL (ref 78.0–100.0)
Monocytes Absolute: 0.5 10*3/uL (ref 0.1–1.0)
Monocytes Relative: 11 % (ref 3–12)
NEUTROS PCT: 67 % (ref 43–77)
Neutro Abs: 2.8 10*3/uL (ref 1.7–7.7)
PLATELETS: 110 10*3/uL — AB (ref 150–400)
RBC: 4.11 MIL/uL (ref 3.87–5.11)
RDW: 13.8 % (ref 11.5–15.5)
WBC: 4.3 10*3/uL (ref 4.0–10.5)

## 2013-09-14 LAB — URINALYSIS, ROUTINE W REFLEX MICROSCOPIC
BILIRUBIN URINE: NEGATIVE
Glucose, UA: NEGATIVE mg/dL
Hgb urine dipstick: NEGATIVE
Ketones, ur: NEGATIVE mg/dL
NITRITE: NEGATIVE
Protein, ur: NEGATIVE mg/dL
Specific Gravity, Urine: 1.017 (ref 1.005–1.030)
UROBILINOGEN UA: 0.2 mg/dL (ref 0.0–1.0)
pH: 7.5 (ref 5.0–8.0)

## 2013-09-14 LAB — I-STAT CHEM 8, ED
BUN: 17 mg/dL (ref 6–23)
CHLORIDE: 104 meq/L (ref 96–112)
Calcium, Ion: 1.36 mmol/L — ABNORMAL HIGH (ref 1.13–1.30)
Creatinine, Ser: 0.8 mg/dL (ref 0.50–1.10)
Glucose, Bld: 92 mg/dL (ref 70–99)
HCT: 40 % (ref 36.0–46.0)
Hemoglobin: 13.6 g/dL (ref 12.0–15.0)
Potassium: 4.4 mEq/L (ref 3.7–5.3)
SODIUM: 140 meq/L (ref 137–147)
TCO2: 26 mmol/L (ref 0–100)

## 2013-09-14 LAB — I-STAT TROPONIN, ED: Troponin i, poc: 0.01 ng/mL (ref 0.00–0.08)

## 2013-09-14 LAB — URINE MICROSCOPIC-ADD ON

## 2013-09-14 MED ORDER — METOPROLOL SUCCINATE ER 25 MG PO TB24: 25.0000 mg | ORAL_TABLET | Freq: Every day | ORAL | Status: DC

## 2013-09-14 NOTE — Telephone Encounter (Signed)
Pt would like go to dr Solmon Ice tyson in [place] ,[place] cardiology. [phone].  Pt has been there before, but daughter thinks she now wants a second opinion.  Pt did see dr Gillian Shields as referred.  Does pt need a referral to dr Elonda Husky? Daughter states pt is back in afib and doesn't feel good.  They are going to see if dr Elonda Husky will work her in

## 2013-09-14 NOTE — ED Provider Notes (Signed)
CSN: [id number]     Arrival date & time 09/14/13  1429 History   First MD Initiated Contact with Patient 09/14/13 1501     Chief Complaint  Patient presents with  . Weakness     (Consider location/radiation/quality/duration/timing/severity/associated sxs/prior Treatment) Patient is a 80 y.o. female presenting with weakness. The history is provided by the patient.  Weakness Pertinent negatives include no chest pain, no abdominal pain, no headaches and no shortness of breath.  patient has had generalized weakness for the last few months. She was discovered to be in afib with RVR at that time and has had a cardioversion with some rhythm control, but not complete. She is on cardizem and metoprolol. No chest pain. No fevers. No cough. No dysuria. No localizing numbness or weakness. Her heart rate has been in the 40s and 50s and states that it is low for her. No bleeding. She has been worse the last few days.    Past Medical History  Diagnosis Date  . CARDIAC ARRHYTHMIA 01/08/2010  . COLONIC POLYPS, HX OF 10/09/2006  . HYPERLIPIDEMIA 10/09/2006  . HYPERTENSION 10/09/2006  . HYPOTHYROIDISM 10/09/2006  . LASSITUDE 08/15/2009  . OSTEOPOROSIS 10/09/2006  . VERTIGO 08/07/2009  . Wears glasses   . Wears hearing aid     left  . Atrial fibrillation    Past Surgical History  Procedure Laterality Date  . Appendectomy    . Cholecystectomy    . Abdominal hysterectomy    . Breast surgery      left - lumpectomy  . Tonsillectomy    . Cataract extraction    . Partial mastectomy with needle localization Left 06/29/2012    Procedure: PARTIAL MASTECTOMY WITH NEEDLE LOCALIZATION;  Surgeon: Adin Hector, MD;  Location: [place];  Service: General;  Laterality: Left;  . Cardioversion N/A 08/09/2013    Procedure: CARDIOVERSION;  Surgeon: Candee Furbish, MD;  Location: [facility] ENDOSCOPY;  Service: Cardiovascular;  Laterality: N/A;   History reviewed. No pertinent family history. History  Substance  Use Topics  . Smoking status: Never Smoker   . Smokeless tobacco: Never Used  . Alcohol Use: No   OB History   Grav Para Term Preterm Abortions TAB SAB Ect Mult Living                 Review of Systems  Constitutional: Positive for fatigue. Negative for activity change and appetite change.  Eyes: Negative for pain.  Respiratory: Negative for chest tightness and shortness of breath.   Cardiovascular: Negative for chest pain and leg swelling.  Gastrointestinal: Negative for nausea, vomiting, abdominal pain and diarrhea.  Genitourinary: Negative for flank pain.  Musculoskeletal: Negative for back pain and neck stiffness.  Skin: Negative for rash.  Neurological: Positive for weakness. Negative for numbness and headaches.  Psychiatric/Behavioral: Negative for behavioral problems.      Allergies  Codeine  Home Medications   Prior to Admission medications   Medication Sig Start Date End Date Taking? Authorizing Provider  alendronate (FOSAMAX) 70 MG tablet Take 1 tablet (70 mg total) by mouth every 7 (seven) days. Take with a full glass of water on an empty stomach. 07/18/13  Yes Marletta Lor, MD  calcium-vitamin D (OSCAL WITH D) 500-200 MG-UNIT per tablet Take 1 tablet by mouth 2 (two) times daily.    Yes Historical Provider, MD  diltiazem (DILACOR XR) 120 MG 24 hr capsule Take 120 mg by mouth 2 (two) times daily.   Yes Historical Provider, MD  levothyroxine (SYNTHROID, LEVOTHROID) 75 MCG tablet Take 75 mcg by mouth daily before breakfast.   Yes Historical Provider, MD  Multiple Vitamin (MULTIVITAMIN) tablet Take 1 tablet by mouth daily.   Yes Historical Provider, MD  rivaroxaban (XARELTO) 20 MG TABS tablet Take 1 tablet (20 mg total) by mouth daily with supper. 08/02/13  Yes Marletta Lor, MD  traMADol (ULTRAM) 50 MG tablet Take 50 mg by mouth every 6 (six) hours as needed (pain).   Yes Historical Provider, MD  vitamin C (ASCORBIC ACID) 500 MG tablet Take 500 mg by mouth  daily.   Yes Historical Provider, MD  zolpidem (AMBIEN) 5 MG tablet Take 1 tablet (5 mg total) by mouth at bedtime as needed for sleep. 08/02/13  Yes Marletta Lor, MD  metoprolol succinate (TOPROL XL) 25 MG 24 hr tablet Take 1 tablet (25 mg total) by mouth daily. 09/14/13   Jasper Riling. Detrick Dani, MD   BP 152/63  Pulse 52  Temp(Src) 97.9 F (36.6 C) (Oral)  Resp 16  SpO2 97% Physical Exam  Nursing note and vitals reviewed. Constitutional: She is oriented to person, place, and time. She appears well-developed and well-nourished.  HENT:  Head: Normocephalic and atraumatic.  Eyes: EOM are normal. Pupils are equal, round, and reactive to light.  Neck: Normal range of motion. Neck supple.  Cardiovascular: Regular rhythm and normal heart sounds.   No murmur heard. Bradycardic.  Pulmonary/Chest: Effort normal and breath sounds normal. No respiratory distress. She has no wheezes. She has no rales.  Abdominal: Soft. Bowel sounds are normal. She exhibits no distension. There is no tenderness. There is no rebound and no guarding.  Musculoskeletal: Normal range of motion.  Neurological: She is alert and oriented to person, place, and time. No cranial nerve deficit.  Skin: Skin is warm and dry.  Psychiatric: She has a normal mood and affect. Her speech is normal.    ED Course  Procedures (including critical care time) Labs Review Labs Reviewed  CBC WITH DIFFERENTIAL - Abnormal; Notable for the following:    Platelets 110 (*)    All other components within normal limits  URINALYSIS, ROUTINE W REFLEX MICROSCOPIC - Abnormal; Notable for the following:    APPearance HAZY (*)    Leukocytes, UA TRACE (*)    All other components within normal limits  I-STAT CHEM 8, ED - Abnormal; Notable for the following:    Calcium, Ion 1.36 (*)    All other components within normal limits  URINE MICROSCOPIC-ADD ON  Randolm Idol, ED    Imaging Review Dg Chest 2 View  09/14/2013   CLINICAL DATA:   Fatigue for 1 day, atrial fibrillation, hypertension, hyperlipidemia  EXAM: CHEST  2 VIEW  COMPARISON:  None  FINDINGS: Enlargement of cardiac silhouette with pulmonary vascular congestion.  Mediastinal contours normal.  Atherosclerotic calcification aorta.  Subsegmental atelectasis at minor fissure.  Underlying emphysematous and bronchitic changes.  No definite acute infiltrate, pleural effusion or pneumothorax.  Bones demineralized.  IMPRESSION: Enlargement of cardiac silhouette with pulmonary vascular congestion.  COPD changes with subsegmental atelectasis at minor fissure.  No acute infiltrate.   Electronically Signed   By: Lavonia Dana M.D.   On: 09/14/2013 16:29     EKG Interpretation   Date/Time:  Wednesday September 14 2013 14:35:32 EDT Ventricular Rate:  51 PR Interval:  191 QRS Duration: 92 QT Interval:  434 QTC Calculation: 400 R Axis:   67 Text Interpretation:  Sinus rhythm Confirmed by [facility]  MD, Ovid Curd  [phone]) on 09/14/2013 3:22:06 PM      MDM   Final diagnoses:  Other fatigue  Bradycardia    Patient with fatigue. Mild bradycardia history of A. fib and did have one brief run of A. fib and is on the monitor. Recently had metoprolol added. This will be decreased. After discussion with cardiology she'll be discharged home to followup with her cardiologist. She is not hypotensive. No syncopal episodes.    Jasper Riling. Alvino Chapel, MD 09/15/13 0030

## 2013-09-14 NOTE — ED Notes (Signed)
[name] presents with a 80 yo female from home with reported intermittent weakness since May 11th (around time after cardioversion).  Pt has been experiencing increased intermittent weakness since she had cardioversion for uncontrolled afib and was started on Xarelto after cardioversion. HR was checked at home by patient/family members and because patient was weak and tired also, 911 was initiated.  Hx of HTN and hypothyroidism. Allergic to codiene

## 2013-09-14 NOTE — Telephone Encounter (Signed)
Spoke to [place] pt's daughter, told her I am not sure if she needs a referral or not but can have our referral coordinator send one just in case. [name] verbalized understanding and stated the paramedics are with her now she had to call due to pulse rate was down to 40 and pt does not feel well. Told her okay, will send order for referral. [name] verbalized understanding.  Order for Cardiology referral sent.

## 2013-09-14 NOTE — Discharge Instructions (Signed)
Bradycardia  Bradycardia is a term for a heart rate (pulse) that, in adults, is slower than 60 beats per minute. A normal rate is 60 to 100 beats per minute. A heart rate below 60 beats per minute may be normal for some adults with healthy hearts. If the rate is too slow, the heart may have trouble pumping the volume of blood the body needs. If the heart rate gets too low, blood flow to the brain may be decreased and may make you feel lightheaded, dizzy, or faint.  The heart has a natural pacemaker in the top of the heart called the SA node (sinoatrial or sinus node). This pacemaker sends out regular electrical signals to the muscle of the heart, telling the heart muscle when to beat (contract). The electrical signal travels from the upper parts of the heart (atria) through the AV node (atrioventricular node), to the lower chambers of the heart (ventricles). The ventricles squeeze, pumping the blood from your heart to your lungs and to the rest of your body.  CAUSES   · Problem with the heart's electrical system.  · Problem with the heart's natural pacemaker.  · Heart disease, damage, or infection.  · Medications.  · Problems with minerals and salts (electrolytes).  SYMPTOMS   · Fainting (syncope).  · Fatigue and weakness.  · Shortness of breath (dyspnea).  · Chest pain (angina).  · Drowsiness.  · Confusion.  DIAGNOSIS   · An electrocardiogram (ECG) can help your caregiver determine the type of slow heart rate you have.  · If the cause is not seen on an ECG, you may need to wear a heart monitor that records your heart rhythm for several hours or days.  · Blood tests.  TREATMENT   · Electrolyte supplements.  · Medications.  · Withholding medication which is causing a slow heart rate.  · Pacemaker placement.  SEEK IMMEDIATE MEDICAL CARE IF:   · You feel lightheaded or faint.  · You develop an irregular heart rate.  · You feel chest pain or have trouble breathing.  MAKE SURE YOU:   · Understand these  instructions.  · Will watch your condition.  · Will get help right away if you are not doing well or get worse.  Document Released: 11/23/2001 Document Revised: 05/26/2011 Document Reviewed: 10/20/2007  Exit[employer]® Patient Information ©2015 [employer]. This information is not intended to replace advice given to you by your health care provider. Make sure you discuss any questions you have with your health care provider.

## 2013-09-26 ENCOUNTER — Telehealth: Payer: Self-pay | Admitting: Internal Medicine

## 2013-09-26 MED ORDER — METOPROLOL SUCCINATE ER 25 MG PO TB24: 25.0000 mg | ORAL_TABLET | Freq: Every day | ORAL | Status: DC

## 2013-09-26 NOTE — Telephone Encounter (Signed)
Pt is needing new rx formetoprolol succinate (TOPROL XL) 25 MG 24 hr tablet, send to [facility] in [place], [place] pt states she only has 2 tabs left.

## 2013-09-26 NOTE — Telephone Encounter (Signed)
Spoke to pt's daughter Juliann Pulse, told her Rx sent to pharmacy as requested. Juliann Pulse verbalized understanding.

## 2013-10-05 ENCOUNTER — Ambulatory Visit: Payer: Medicare Other | Admitting: Physician Assistant

## 2013-10-17 DIAGNOSIS — R42 Dizziness and giddiness: Secondary | ICD-10-CM | POA: Diagnosis not present

## 2013-10-17 DIAGNOSIS — R002 Palpitations: Secondary | ICD-10-CM | POA: Diagnosis not present

## 2013-10-17 DIAGNOSIS — I4892 Unspecified atrial flutter: Secondary | ICD-10-CM | POA: Diagnosis not present

## 2013-10-17 DIAGNOSIS — I10 Essential (primary) hypertension: Secondary | ICD-10-CM | POA: Diagnosis not present

## 2013-10-17 DIAGNOSIS — E785 Hyperlipidemia, unspecified: Secondary | ICD-10-CM | POA: Diagnosis not present

## 2013-10-17 DIAGNOSIS — R5381 Other malaise: Secondary | ICD-10-CM | POA: Diagnosis not present

## 2013-10-28 ENCOUNTER — Telehealth: Payer: Self-pay | Admitting: Internal Medicine

## 2013-10-28 NOTE — Telephone Encounter (Signed)
Pt is wearing a heart monitor and it will be returned Mon 8/17. Pt is thinking she should wait until results are back from her heart monitor which her fup w/ them is 8/26.  Pt has appt 8/18 w/ dr Raliegh Ip.  Should she keep appt or resc until after all the heart appts ?

## 2013-10-31 DIAGNOSIS — I4949 Other premature depolarization: Secondary | ICD-10-CM | POA: Diagnosis not present

## 2013-10-31 DIAGNOSIS — I491 Atrial premature depolarization: Secondary | ICD-10-CM | POA: Diagnosis not present

## 2013-10-31 DIAGNOSIS — I4892 Unspecified atrial flutter: Secondary | ICD-10-CM | POA: Diagnosis not present

## 2013-10-31 NOTE — Telephone Encounter (Signed)
Spoke to pt's daughter [name], told her it is okay if pt wants to wait till after she sees Cardiology to follow up with Dr. Scarlette Calico verbalized understanding and asked if I would cancel appointment. Told [name] will cancel appt. [name] verbalized understanding. Called Gwen and cancelled appt.

## 2013-11-01 ENCOUNTER — Ambulatory Visit: Payer: Medicare Other | Admitting: Internal Medicine

## 2013-11-03 DIAGNOSIS — I4892 Unspecified atrial flutter: Secondary | ICD-10-CM | POA: Diagnosis not present

## 2013-11-09 DIAGNOSIS — I495 Sick sinus syndrome: Secondary | ICD-10-CM | POA: Diagnosis not present

## 2013-11-09 DIAGNOSIS — I10 Essential (primary) hypertension: Secondary | ICD-10-CM | POA: Diagnosis not present

## 2013-11-09 DIAGNOSIS — I4892 Unspecified atrial flutter: Secondary | ICD-10-CM | POA: Diagnosis not present

## 2013-11-09 DIAGNOSIS — R002 Palpitations: Secondary | ICD-10-CM | POA: Diagnosis not present

## 2013-11-25 DIAGNOSIS — I10 Essential (primary) hypertension: Secondary | ICD-10-CM | POA: Diagnosis not present

## 2013-11-25 DIAGNOSIS — I2119 ST elevation (STEMI) myocardial infarction involving other coronary artery of inferior wall: Secondary | ICD-10-CM | POA: Diagnosis not present

## 2013-11-25 DIAGNOSIS — I4892 Unspecified atrial flutter: Secondary | ICD-10-CM | POA: Diagnosis not present

## 2013-11-25 DIAGNOSIS — R5383 Other fatigue: Secondary | ICD-10-CM | POA: Diagnosis not present

## 2013-11-25 DIAGNOSIS — I495 Sick sinus syndrome: Secondary | ICD-10-CM | POA: Diagnosis not present

## 2013-11-25 DIAGNOSIS — R5381 Other malaise: Secondary | ICD-10-CM | POA: Diagnosis not present

## 2013-12-01 DIAGNOSIS — I252 Old myocardial infarction: Secondary | ICD-10-CM | POA: Diagnosis not present

## 2013-12-01 DIAGNOSIS — E785 Hyperlipidemia, unspecified: Secondary | ICD-10-CM | POA: Diagnosis not present

## 2013-12-01 DIAGNOSIS — I2510 Atherosclerotic heart disease of native coronary artery without angina pectoris: Secondary | ICD-10-CM | POA: Diagnosis not present

## 2013-12-01 DIAGNOSIS — E039 Hypothyroidism, unspecified: Secondary | ICD-10-CM | POA: Diagnosis not present

## 2013-12-01 DIAGNOSIS — Z7901 Long term (current) use of anticoagulants: Secondary | ICD-10-CM | POA: Diagnosis not present

## 2013-12-01 DIAGNOSIS — I10 Essential (primary) hypertension: Secondary | ICD-10-CM | POA: Diagnosis not present

## 2013-12-01 DIAGNOSIS — I495 Sick sinus syndrome: Secondary | ICD-10-CM | POA: Diagnosis not present

## 2013-12-01 DIAGNOSIS — Z79899 Other long term (current) drug therapy: Secondary | ICD-10-CM | POA: Diagnosis not present

## 2013-12-01 DIAGNOSIS — M19049 Primary osteoarthritis, unspecified hand: Secondary | ICD-10-CM | POA: Diagnosis not present

## 2013-12-01 DIAGNOSIS — I4892 Unspecified atrial flutter: Secondary | ICD-10-CM | POA: Diagnosis not present

## 2013-12-01 DIAGNOSIS — Z01818 Encounter for other preprocedural examination: Secondary | ICD-10-CM | POA: Diagnosis not present

## 2013-12-01 DIAGNOSIS — I4891 Unspecified atrial fibrillation: Secondary | ICD-10-CM | POA: Diagnosis not present

## 2013-12-05 DIAGNOSIS — I495 Sick sinus syndrome: Secondary | ICD-10-CM | POA: Diagnosis not present

## 2013-12-05 DIAGNOSIS — Z45018 Encounter for adjustment and management of other part of cardiac pacemaker: Secondary | ICD-10-CM | POA: Diagnosis not present

## 2013-12-05 DIAGNOSIS — Z7901 Long term (current) use of anticoagulants: Secondary | ICD-10-CM | POA: Diagnosis not present

## 2013-12-05 DIAGNOSIS — I2510 Atherosclerotic heart disease of native coronary artery without angina pectoris: Secondary | ICD-10-CM | POA: Diagnosis not present

## 2013-12-05 DIAGNOSIS — Z79899 Other long term (current) drug therapy: Secondary | ICD-10-CM | POA: Diagnosis not present

## 2013-12-05 DIAGNOSIS — I10 Essential (primary) hypertension: Secondary | ICD-10-CM | POA: Diagnosis not present

## 2013-12-05 DIAGNOSIS — I4891 Unspecified atrial fibrillation: Secondary | ICD-10-CM | POA: Diagnosis not present

## 2013-12-05 HISTORY — PX: PACEMAKER PLACEMENT: SHX43

## 2013-12-06 DIAGNOSIS — Z79899 Other long term (current) drug therapy: Secondary | ICD-10-CM | POA: Diagnosis not present

## 2013-12-06 DIAGNOSIS — I2510 Atherosclerotic heart disease of native coronary artery without angina pectoris: Secondary | ICD-10-CM | POA: Diagnosis not present

## 2013-12-06 DIAGNOSIS — I10 Essential (primary) hypertension: Secondary | ICD-10-CM | POA: Diagnosis not present

## 2013-12-06 DIAGNOSIS — Z7901 Long term (current) use of anticoagulants: Secondary | ICD-10-CM | POA: Diagnosis not present

## 2013-12-06 DIAGNOSIS — I4891 Unspecified atrial fibrillation: Secondary | ICD-10-CM | POA: Diagnosis not present

## 2013-12-06 DIAGNOSIS — I495 Sick sinus syndrome: Secondary | ICD-10-CM | POA: Diagnosis not present

## 2013-12-19 DIAGNOSIS — I4892 Unspecified atrial flutter: Secondary | ICD-10-CM | POA: Diagnosis not present

## 2013-12-19 DIAGNOSIS — I495 Sick sinus syndrome: Secondary | ICD-10-CM | POA: Diagnosis not present

## 2013-12-19 DIAGNOSIS — Z950 Presence of cardiac pacemaker: Secondary | ICD-10-CM | POA: Diagnosis not present

## 2013-12-27 ENCOUNTER — Ambulatory Visit: Payer: Medicare Other | Admitting: Internal Medicine

## 2014-01-31 ENCOUNTER — Other Ambulatory Visit: Payer: Self-pay | Admitting: Internal Medicine

## 2014-02-06 ENCOUNTER — Other Ambulatory Visit: Payer: Self-pay | Admitting: Internal Medicine

## 2014-03-01 ENCOUNTER — Other Ambulatory Visit: Payer: Self-pay | Admitting: Internal Medicine

## 2014-03-14 ENCOUNTER — Ambulatory Visit (INDEPENDENT_AMBULATORY_CARE_PROVIDER_SITE_OTHER): Payer: Medicare Other | Admitting: Internal Medicine

## 2014-03-14 ENCOUNTER — Encounter: Payer: Self-pay | Admitting: Internal Medicine

## 2014-03-14 VITALS — BP 110/80 | HR 91 | Temp 98.1°F | Wt 131.0 lb

## 2014-03-14 DIAGNOSIS — E785 Hyperlipidemia, unspecified: Secondary | ICD-10-CM | POA: Diagnosis not present

## 2014-03-14 DIAGNOSIS — I4891 Unspecified atrial fibrillation: Secondary | ICD-10-CM

## 2014-03-14 DIAGNOSIS — E038 Other specified hypothyroidism: Secondary | ICD-10-CM | POA: Diagnosis not present

## 2014-03-14 DIAGNOSIS — E034 Atrophy of thyroid (acquired): Secondary | ICD-10-CM | POA: Diagnosis not present

## 2014-03-14 DIAGNOSIS — Z7901 Long term (current) use of anticoagulants: Secondary | ICD-10-CM | POA: Diagnosis not present

## 2014-03-14 DIAGNOSIS — I10 Essential (primary) hypertension: Secondary | ICD-10-CM

## 2014-03-14 LAB — BASIC METABOLIC PANEL
BUN: 20 mg/dL (ref 6–23)
CO2: 33 mEq/L — ABNORMAL HIGH (ref 19–32)
CREATININE: 0.9 mg/dL (ref 0.4–1.2)
Calcium: 11.1 mg/dL — ABNORMAL HIGH (ref 8.4–10.5)
Chloride: 103 mEq/L (ref 96–112)
GFR: 63.82 mL/min (ref >60.00)
Glucose, Bld: 86 mg/dL (ref 70–99)
Potassium: 4.6 mEq/L (ref 3.5–5.1)
SODIUM: 143 meq/L (ref 135–145)

## 2014-03-14 LAB — TSH: TSH: 1.03 u[IU]/mL (ref 0.35–4.50)

## 2014-03-14 MED ORDER — ZOLPIDEM TARTRATE 5 MG PO TABS: 5.0000 mg | ORAL_TABLET | Freq: Every evening | ORAL | Status: DC | PRN

## 2014-03-14 NOTE — Patient Instructions (Signed)
Limit your sodium (Salt) intake  Please check your blood pressure on a regular basis.  If it is consistently greater than 150/90, please make an office appointment.  Return in 6 months for follow-up   

## 2014-03-14 NOTE — Progress Notes (Signed)
Subjective:    Patient ID: Christine Perez, female    DOB: November 14, 1925, 80 y.o.   MRN: [id number]  HPI  80 year old patient who is seen today in follow-up.  She was diagnosed with new onset atrial fibrillation in the spring of last year.  She was seen by cardiology and had an initial cardioversion, but she reverted to atrial fibrillation.  She was maintained on anticoagulation as well as rate control medication.  She initially did poorly with the onset of atrial fibrillation with weakness.  This intensified in July and she was seen in the ED where she was noted to have significant sinus bradycardia.  Rate control medications were discontinued.  At the present time.  She remains on losartan hydrochlorothiazide for hypertension.  She generally feels well today and is back to baseline.  She has treated hypothyroidism.  Remains on chronic anticoagulation  Past Medical History  Diagnosis Date  . CARDIAC ARRHYTHMIA 01/08/2010  . COLONIC POLYPS, HX OF 10/09/2006  . HYPERLIPIDEMIA 10/09/2006  . HYPERTENSION 10/09/2006  . HYPOTHYROIDISM 10/09/2006  . LASSITUDE 08/15/2009  . OSTEOPOROSIS 10/09/2006  . VERTIGO 08/07/2009  . Wears glasses   . Wears hearing aid     left  . Atrial fibrillation     History   Social History  . Marital Status: Widowed    Spouse Name: N/A    Number of Children: N/A  . Years of Education: N/A   Occupational History  . Not on file.   Social History Main Topics  . Smoking status: Never Smoker   . Smokeless tobacco: Never Used  . Alcohol Use: No  . Drug Use: No  . Sexual Activity: Not on file   Other Topics Concern  . Not on file   Social History Narrative    Past Surgical History  Procedure Laterality Date  . Appendectomy    . Cholecystectomy    . Abdominal hysterectomy    . Breast surgery      left - lumpectomy  . Tonsillectomy    . Cataract extraction    . Partial mastectomy with needle localization Left 06/29/2012    Procedure: PARTIAL MASTECTOMY WITH  NEEDLE LOCALIZATION;  Surgeon: Adin Hector, MD;  Location: [place];  Service: General;  Laterality: Left;  . Cardioversion N/A 08/09/2013    Procedure: CARDIOVERSION;  Surgeon: Candee Furbish, MD;  Location: [facility] ENDOSCOPY;  Service: Cardiovascular;  Laterality: N/A;    No family history on file.  Allergies  Allergen Reactions  . Codeine Shortness Of Breath    Difficulty breathing    Current Outpatient Prescriptions on File Prior to Visit  Medication Sig Dispense Refill  . calcium-vitamin D (OSCAL WITH D) 500-200 MG-UNIT per tablet Take 1 tablet by mouth 2 (two) times daily.     Marland Kitchen levothyroxine (SYNTHROID, LEVOTHROID) 75 MCG tablet Take 75 mcg by mouth daily before breakfast.    . Multiple Vitamin (MULTIVITAMIN) tablet Take 1 tablet by mouth daily.    Marland Kitchen SYNTHROID 75 MCG tablet TAKE ONE TABLET BY MOUTH ONCE DAILY 90 tablet 1  . traMADol (ULTRAM) 50 MG tablet Take 50 mg by mouth every 6 (six) hours as needed (pain).    . vitamin C (ASCORBIC ACID) 500 MG tablet Take 500 mg by mouth daily.    Alveda Reasons 20 MG TABS tablet TAKE ONE TABLET BY MOUTH ONCE DAILY WITH SUPPER 30 tablet 5  . zolpidem (AMBIEN) 5 MG tablet Take 1 tablet (5 mg total) by mouth  at bedtime as needed for sleep. 15 tablet 2  . alendronate (FOSAMAX) 70 MG tablet Take 1 tablet (70 mg total) by mouth every 7 (seven) days. Take with a full glass of water on an empty stomach. (Patient not taking: Reported on 03/14/2014) 4 tablet 11  . [DISCONTINUED] metoprolol tartrate (LOPRESSOR) 25 MG tablet Take 1 tablet (25 mg total) by mouth 2 (two) times daily. 60 tablet 5   No current facility-administered medications on file prior to visit.    BP 110/80 mmHg  Pulse 91  Temp(Src) 98.1 F (36.7 C) (Oral)  Wt 131 lb (59.421 kg)  SpO2 95%     Review of Systems  Constitutional: Negative.   HENT: Negative for congestion, dental problem, hearing loss, rhinorrhea, sinus pressure, sore throat and tinnitus.   Eyes:  Negative for pain, discharge and visual disturbance.  Respiratory: Negative for cough and shortness of breath.   Cardiovascular: Negative for chest pain, palpitations and leg swelling.  Gastrointestinal: Negative for nausea, vomiting, abdominal pain, diarrhea, constipation, blood in stool and abdominal distention.  Genitourinary: Negative for dysuria, urgency, frequency, hematuria, flank pain, vaginal bleeding, vaginal discharge, difficulty urinating, vaginal pain and pelvic pain.  Musculoskeletal: Positive for back pain. Negative for joint swelling, arthralgias and gait problem.  Skin: Negative for rash.  Neurological: Negative for dizziness, syncope, speech difficulty, weakness, numbness and headaches.  Hematological: Negative for adenopathy.  Psychiatric/Behavioral: Negative for behavioral problems, dysphoric mood and agitation. The patient is not nervous/anxious.        Objective:   Physical Exam  Constitutional: She is oriented to person, place, and time. She appears well-developed and well-nourished.  HENT:  Head: Normocephalic.  Right Ear: External ear normal.  Left Ear: External ear normal.  Mouth/Throat: Oropharynx is clear and moist.  Eyes: Conjunctivae and EOM are normal. Pupils are equal, round, and reactive to light.  Neck: Normal range of motion. Neck supple. No thyromegaly present.  Cardiovascular: Normal rate, regular rhythm and normal heart sounds.   Regular rhythm with rare ectopics only Pedal pulses full, except for an absent left dorsalis pedis pulse  Pulmonary/Chest: Effort normal and breath sounds normal.  Kyphosis  Abdominal: Soft. Bowel sounds are normal. She exhibits no mass. There is no tenderness.  Musculoskeletal: Normal range of motion.  Lymphadenopathy:    She has no cervical adenopathy.  Neurological: She is alert and oriented to person, place, and time.  Skin: Skin is warm and dry. No rash noted.  Psychiatric: She has a normal mood and affect. Her  behavior is normal.          Assessment & Plan:   Paroxysmal atrial fibrillation.  Presently normal sinus rhythm.  Will continue anticoagulation Hypertension, well-controlled Dyslipidemia Osteoporosis/kyphosis  We'll check electrolytes today CPX 6 months

## 2014-03-15 MED ORDER — LOSARTAN POTASSIUM 100 MG PO TABS: 100.0000 mg | ORAL_TABLET | Freq: Every day | ORAL | Status: DC

## 2014-03-15 NOTE — Addendum Note (Signed)
Addended by: Colleen Can on: 03/15/2014 12:01 PM   Modules accepted: Orders, Medications

## 2014-04-25 DIAGNOSIS — R5383 Other fatigue: Secondary | ICD-10-CM | POA: Diagnosis not present

## 2014-04-25 DIAGNOSIS — R531 Weakness: Secondary | ICD-10-CM | POA: Diagnosis not present

## 2014-04-25 DIAGNOSIS — I4892 Unspecified atrial flutter: Secondary | ICD-10-CM | POA: Diagnosis not present

## 2014-04-25 DIAGNOSIS — E039 Hypothyroidism, unspecified: Secondary | ICD-10-CM | POA: Diagnosis not present

## 2014-04-25 DIAGNOSIS — Z950 Presence of cardiac pacemaker: Secondary | ICD-10-CM | POA: Diagnosis not present

## 2014-04-25 DIAGNOSIS — I495 Sick sinus syndrome: Secondary | ICD-10-CM | POA: Diagnosis not present

## 2014-05-08 DIAGNOSIS — R5383 Other fatigue: Secondary | ICD-10-CM | POA: Diagnosis not present

## 2014-05-08 DIAGNOSIS — I480 Paroxysmal atrial fibrillation: Secondary | ICD-10-CM | POA: Diagnosis not present

## 2014-05-08 DIAGNOSIS — Z950 Presence of cardiac pacemaker: Secondary | ICD-10-CM | POA: Diagnosis not present

## 2014-05-08 DIAGNOSIS — Z45010 Encounter for checking and testing of cardiac pacemaker pulse generator [battery]: Secondary | ICD-10-CM | POA: Diagnosis not present

## 2014-05-17 ENCOUNTER — Other Ambulatory Visit: Payer: Self-pay | Admitting: Internal Medicine

## 2014-05-22 DIAGNOSIS — I10 Essential (primary) hypertension: Secondary | ICD-10-CM | POA: Diagnosis not present

## 2014-05-22 DIAGNOSIS — I480 Paroxysmal atrial fibrillation: Secondary | ICD-10-CM | POA: Diagnosis not present

## 2014-05-22 DIAGNOSIS — Z45010 Encounter for checking and testing of cardiac pacemaker pulse generator [battery]: Secondary | ICD-10-CM | POA: Diagnosis not present

## 2014-05-22 DIAGNOSIS — Z950 Presence of cardiac pacemaker: Secondary | ICD-10-CM | POA: Diagnosis not present

## 2014-05-24 ENCOUNTER — Telehealth: Payer: Self-pay | Admitting: Internal Medicine

## 2014-05-24 NOTE — Telephone Encounter (Signed)
Please see message and advise 

## 2014-05-24 NOTE — Telephone Encounter (Signed)
Patient states her cardiologist added amiodarone 400 mg, take 1 at dinner and metoprolol ER 50 mg, take 1 at bedtime to her medications.  She would like to know if it's okay to take these 2 in addition to the other medications she is currently on prescribed by Dr. Raliegh Ip??  She is aware that today is Dr. Marthann Schiller half day and she should get a callback tomorrow about this.

## 2014-05-24 NOTE — Telephone Encounter (Signed)
Yes all ok

## 2014-05-24 NOTE — Telephone Encounter (Signed)
Spoke to pt, told her Dr. Raliegh Ip said it was all okay what Cardiologist wants her to do. Pt verbalized understanding.

## 2014-06-22 DIAGNOSIS — H26493 Other secondary cataract, bilateral: Secondary | ICD-10-CM | POA: Diagnosis not present

## 2014-06-22 DIAGNOSIS — D3132 Benign neoplasm of left choroid: Secondary | ICD-10-CM | POA: Diagnosis not present

## 2014-06-22 DIAGNOSIS — H04123 Dry eye syndrome of bilateral lacrimal glands: Secondary | ICD-10-CM | POA: Diagnosis not present

## 2014-06-22 DIAGNOSIS — D3131 Benign neoplasm of right choroid: Secondary | ICD-10-CM | POA: Diagnosis not present

## 2014-06-22 DIAGNOSIS — Z961 Presence of intraocular lens: Secondary | ICD-10-CM | POA: Diagnosis not present

## 2014-06-28 DIAGNOSIS — H26493 Other secondary cataract, bilateral: Secondary | ICD-10-CM | POA: Diagnosis not present

## 2014-07-01 ENCOUNTER — Other Ambulatory Visit: Payer: Self-pay | Admitting: Internal Medicine

## 2014-07-31 DIAGNOSIS — I498 Other specified cardiac arrhythmias: Secondary | ICD-10-CM | POA: Diagnosis not present

## 2014-07-31 DIAGNOSIS — Z45010 Encounter for checking and testing of cardiac pacemaker pulse generator [battery]: Secondary | ICD-10-CM | POA: Diagnosis not present

## 2014-08-01 ENCOUNTER — Other Ambulatory Visit: Payer: Self-pay | Admitting: Internal Medicine

## 2014-08-11 ENCOUNTER — Other Ambulatory Visit: Payer: Self-pay | Admitting: Internal Medicine

## 2014-08-29 ENCOUNTER — Encounter: Payer: Self-pay | Admitting: Internal Medicine

## 2014-08-29 ENCOUNTER — Ambulatory Visit (INDEPENDENT_AMBULATORY_CARE_PROVIDER_SITE_OTHER): Payer: Medicare Other | Admitting: Internal Medicine

## 2014-08-29 ENCOUNTER — Other Ambulatory Visit: Payer: Self-pay | Admitting: *Deleted

## 2014-08-29 VITALS — BP 130/90 | HR 72 | Temp 98.5°F | Resp 20 | Ht 58.5 in | Wt 131.0 lb

## 2014-08-29 DIAGNOSIS — Z0000 Encounter for general adult medical examination without abnormal findings: Secondary | ICD-10-CM

## 2014-08-29 DIAGNOSIS — Z7901 Long term (current) use of anticoagulants: Secondary | ICD-10-CM

## 2014-08-29 DIAGNOSIS — Z86010 Personal history of colonic polyps: Secondary | ICD-10-CM

## 2014-08-29 DIAGNOSIS — I10 Essential (primary) hypertension: Secondary | ICD-10-CM

## 2014-08-29 DIAGNOSIS — E039 Hypothyroidism, unspecified: Secondary | ICD-10-CM | POA: Diagnosis not present

## 2014-08-29 DIAGNOSIS — M810 Age-related osteoporosis without current pathological fracture: Secondary | ICD-10-CM

## 2014-08-29 DIAGNOSIS — I480 Paroxysmal atrial fibrillation: Secondary | ICD-10-CM | POA: Diagnosis not present

## 2014-08-29 LAB — COMPREHENSIVE METABOLIC PANEL
ALT: 25 U/L (ref 0–35)
AST: 28 U/L (ref 0–37)
Albumin: 4.4 g/dL (ref 3.5–5.2)
Alkaline Phosphatase: 84 U/L (ref 39–117)
BUN: 21 mg/dL (ref 6–23)
CHLORIDE: 102 meq/L (ref 96–112)
CO2: 32 mEq/L (ref 19–32)
Calcium: 10.7 mg/dL — ABNORMAL HIGH (ref 8.4–10.5)
Creatinine, Ser: 0.99 mg/dL (ref 0.40–1.20)
GFR: 56.38 mL/min — AB (ref >60.00)
GLUCOSE: 91 mg/dL (ref 70–99)
POTASSIUM: 4 meq/L (ref 3.5–5.1)
Sodium: 139 mEq/L (ref 135–145)
Total Bilirubin: 0.5 mg/dL (ref 0.2–1.2)
Total Protein: 7.4 g/dL (ref 6.0–8.3)

## 2014-08-29 LAB — CBC WITH DIFFERENTIAL/PLATELET
BASOS ABS: 0 10*3/uL (ref 0.0–0.1)
Basophils Relative: 0.5 % (ref 0.0–3.0)
EOS ABS: 0 10*3/uL (ref 0.0–0.7)
Eosinophils Relative: 0.8 % (ref 0.0–5.0)
HEMATOCRIT: 37.8 % (ref 36.0–46.0)
Hemoglobin: 12.6 g/dL (ref 12.0–15.0)
LYMPHS ABS: 1.4 10*3/uL (ref 0.7–4.0)
Lymphocytes Relative: 29.1 % (ref 12.0–46.0)
MCHC: 33.3 g/dL (ref 30.0–36.0)
MCV: 95 fl (ref 78.0–100.0)
MONOS PCT: 11.7 % (ref 3.0–12.0)
Monocytes Absolute: 0.6 10*3/uL (ref 0.1–1.0)
NEUTROS ABS: 2.8 10*3/uL (ref 1.4–7.7)
Neutrophils Relative %: 57.9 % (ref 43.0–77.0)
Platelets: 120 10*3/uL — ABNORMAL LOW (ref 150.0–400.0)
RBC: 3.98 Mil/uL (ref 3.87–5.11)
RDW: 15.1 % (ref 11.5–15.5)
WBC: 4.9 10*3/uL (ref 4.0–10.5)

## 2014-08-29 LAB — TSH: TSH: 2.24 u[IU]/mL (ref 0.35–4.50)

## 2014-08-29 MED ORDER — LOSARTAN POTASSIUM 100 MG PO TABS: 100.0000 mg | ORAL_TABLET | Freq: Every day | ORAL | Status: DC

## 2014-08-29 MED ORDER — ZOLPIDEM TARTRATE 5 MG PO TABS: 5.0000 mg | ORAL_TABLET | Freq: Every evening | ORAL | Status: DC | PRN

## 2014-08-29 MED ORDER — RIVAROXABAN 20 MG PO TABS: ORAL_TABLET | ORAL | Status: DC

## 2014-08-29 NOTE — Patient Instructions (Signed)
Cardiology follow-up as scheduled.  Continue annual eye examinations with Dr. Katy Fitch.  Return in one year for follow-up or as necessary  Schedule your mammogram.  Limit your sodium (Salt) intake    It is important that you exercise regularly, at least 20 minutes 3 to 4 times per week.  If you develop chest pain or shortness of breath seek  medical attention.

## 2014-08-29 NOTE — Progress Notes (Signed)
Subjective:    Patient ID: Christine Perez, female    DOB: February 16, 1926, 81 y.o.   MRN: [id number]  HPI     Subjective:    Patient ID: Christine Perez, female    DOB: 04/22/1925, 81 y.o.   MRN: [id number]  HPI   HPI  81 year old patient who is seen today for an annual exam.   Medical problems include hypothyroidism as well as dyslipidemia. She has treated hypertension. She is doing quite well. She does have a history of colonic polyps and underwent a colonoscopy approximately 5  years ago; no major concerns or complaints today except for some intermittent fatigue.  Approximately one year ago, at the time of her last physical, she was diagnosed with new onset atrial fibrillation.  Remains on chronic anticoagulation and rate control.  She is status post permanent pacemaker in September 2015 apparently for sick sinus syndrome  Laboratory studies reviewed;  recent breast biopsy March 2015 benign   Alcohol-Tobacco  Smoking Status: never   Allergies:  1) ! Codeine   Past History:  Past Medical History:  And in fact Hypertension  Hypercholesterolemia  Colonic polyps, hx of  Hyperlipidemia  Hypothyroidism  Osteoporosis  Atrial fibrillation/ tachycardia bradycardia syndrome Chronic anticoagulation History of left breast carcinoma  Past Surgical History:  Appendectomy  Cholecystectomy  Hysterectomy  Lumpectomy-left  Tonsillectomy  cataracts 5-10  Status post left partial mastectomy April 2014 for lobular carcinoma in situ Status post pacemaker September 2015 for tachybradycardia syndrome (Dr Elonda Husky [facility])  Family History:   Family History Other cancer-Colon, breast ; mother with colon cancer sister and a maternal cousin with breast cancer ; maternal aunt with ovarian cancer Fam hx MI   Social History:   Married  Alcohol use-no  NonsmokerSmoking Status: never   Medicare Wellness  1. Risk factors, based on past M,S,F history-cardiovascular risk factors include  hypertension dyslipidemia  2. Physical activities: Fairly sedentary but no restrictions  3. Depression/mood: No history depression or mood disorder  4. Hearing: Moderate deficits-uses hearing aids 5. ADL's: Independent in all aspects of daily living  6. Fall risk:  moderate due to  mild obesity and age.  Complains some mild balance issues 7. Home safety: No problems identified. Lives with her daughter  54. Height weight, and visual acuity; height and weight stable does have a history of osteoporosis and kyphosis. Recently passed her driver's license exam with no restrictions with glasses;  she has a cataract extraction surgery in the past ; has had recent laser surgery with Dr. Carolynn Sayers 9. Counseling: Heart healthy diet modest weight loss and more regular exercise all encouraged  10. Lab orders based on risk factors: Laboratory profile including TSH and lipid panel will be reviewed  11. Referral: Not appropriate at this time. Has had a recent mammogram  12. Care plan: Restricted salt diet exercise weight loss encouraged  13. Cognitive assessment: Alert and appropriate with normal affect. No cognitive dysfunction  14.  Preventive services will include annual breast mammograms due to her history of left breast carcinoma.  She will be seen by cardiology and primary care medicine.  Annual eye examinations.  Encouraged 15.  Provider list update includes primary care cardiology and ophthalmology   Past Medical History  Diagnosis Date  . CARDIAC ARRHYTHMIA 01/08/2010  . COLONIC POLYPS, HX OF 10/09/2006  . HYPERLIPIDEMIA 10/09/2006  . HYPERTENSION 10/09/2006  . HYPOTHYROIDISM 10/09/2006  . LASSITUDE 08/15/2009  . OSTEOPOROSIS 10/09/2006  . VERTIGO 08/07/2009  . Wears glasses   .  Wears hearing aid     left  . Atrial fibrillation     History   Social History  . Marital Status: Widowed    Spouse Name: N/A  . Number of Children: N/A  . Years of Education: N/A   Occupational History  . Not on file.     Social History Main Topics  . Smoking status: Never Smoker   . Smokeless tobacco: Never Used  . Alcohol Use: No  . Drug Use: No  . Sexual Activity: Not on file   Other Topics Concern  . Not on file   Social History Narrative    Past Surgical History  Procedure Laterality Date  . Appendectomy    . Cholecystectomy    . Abdominal hysterectomy    . Breast surgery      left - lumpectomy  . Tonsillectomy    . Cataract extraction    . Partial mastectomy with needle localization Left 06/29/2012    Procedure: PARTIAL MASTECTOMY WITH NEEDLE LOCALIZATION;  Surgeon: Adin Hector, MD;  Location: [place];  Service: General;  Laterality: Left;  . Cardioversion N/A 08/09/2013    Procedure: CARDIOVERSION;  Surgeon: Candee Furbish, MD;  Location: [facility] ENDOSCOPY;  Service: Cardiovascular;  Laterality: N/A;  . Pacemaker placement N/A 12/05/2013    No family history on file.  Allergies  Allergen Reactions  . Codeine Shortness Of Breath    Difficulty breathing    Current Outpatient Prescriptions on File Prior to Visit  Medication Sig Dispense Refill  . calcium-vitamin D (OSCAL WITH D) 500-200 MG-UNIT per tablet Take 1 tablet by mouth 2 (two) times daily.     Marland Kitchen levothyroxine (SYNTHROID, LEVOTHROID) 75 MCG tablet Take 75 mcg by mouth daily before breakfast.    . losartan (COZAAR) 100 MG tablet TAKE ONE TABLET BY MOUTH ONCE DAILY 90 tablet 0  . meclizine (ANTIVERT) 25 MG tablet TAKE ONE TABLET BY MOUTH EVERY 6 HOURS AS NEEDED FOR DIZZINESS 50 tablet 0  . Multiple Vitamin (MULTIVITAMIN) tablet Take 1 tablet by mouth daily.    Marland Kitchen SYNTHROID 75 MCG tablet TAKE ONE TABLET BY MOUTH ONCE DAILY 90 tablet 1  . traMADol (ULTRAM) 50 MG tablet Take 50 mg by mouth every 6 (six) hours as needed (pain).    . vitamin C (ASCORBIC ACID) 500 MG tablet Take 500 mg by mouth daily.    Alveda Reasons 20 MG TABS tablet TAKE ONE TABLET BY MOUTH ONCE DAILY WITH SUPPER 30 tablet 5  . zolpidem (AMBIEN) 5 MG  tablet Take 1 tablet (5 mg total) by mouth at bedtime as needed for sleep. 30 tablet 2  . [DISCONTINUED] metoprolol tartrate (LOPRESSOR) 25 MG tablet Take 1 tablet (25 mg total) by mouth 2 (two) times daily. 60 tablet 5   No current facility-administered medications on file prior to visit.    There were no vitals taken for this visit.      Review of Systems  Constitutional: Negative for fever, appetite change, fatigue and unexpected weight change.  HENT: Positive for hearing loss. Negative for congestion, dental problem, ear pain, mouth sores, nosebleeds, sinus pressure, sore throat, tinnitus, trouble swallowing and voice change.   Eyes: Negative for photophobia, pain, redness and visual disturbance.  Respiratory: Negative for cough, chest tightness and shortness of breath.   Cardiovascular: Negative for chest pain, palpitations and leg swelling.  Gastrointestinal: Negative for nausea, vomiting, abdominal pain, diarrhea, constipation, blood in stool, abdominal distention and rectal pain.  Genitourinary: Negative  for dysuria, urgency, frequency, hematuria, flank pain, vaginal bleeding, vaginal discharge, difficulty urinating, genital sores, vaginal pain, menstrual problem and pelvic pain.  Musculoskeletal: Negative for arthralgias, back pain and neck stiffness.  Skin: Negative for rash.  Neurological: Negative for dizziness, syncope, speech difficulty, weakness, light-headedness, numbness and headaches.  Hematological: Negative for adenopathy. Does not bruise/bleed easily.  Psychiatric/Behavioral: Negative for suicidal ideas, behavioral problems, self-injury, dysphoric mood and agitation. The patient is not nervous/anxious.        Objective:   Physical Exam  Constitutional: She is oriented to person, place, and time. She appears well-developed and well-nourished.  HENT:  Head: Normocephalic and atraumatic.  Right Ear: External ear normal.  Left Ear: External ear normal.    Mouth/Throat: Oropharynx is clear and moist.  Hearing aid present on the left  Low hanging soft palate  Eyes: Conjunctivae and EOM are normal.  Neck: Normal range of motion. Neck supple. No JVD present. No thyromegaly present.  Cardiovascular: Normal rate, normal heart sounds and intact distal pulses.   No murmur heard.  pulse regular with rate of 70 Pulmonary/Chest: Effort normal and breath sounds normal. She has no wheezes. She has no rales.  Kyphosis  Abdominal: Soft. Bowel sounds are normal. She exhibits no distension and no mass. There is no tenderness. There is no rebound and no guarding.  Abdominal scar mildly obese no masses  Musculoskeletal: Normal range of motion. She exhibits no edema and no tenderness.  Neurological: She is alert and oriented to person, place, and time. She has normal reflexes. No cranial nerve deficit. She exhibits normal muscle tone. Coordination normal.  Skin: Skin is warm and dry. No rash noted.  Psychiatric: She has a normal mood and affect. Her behavior is normal.    5 mm slightly raised papule right outer forearm. This was treated with cryotherapy without difficulty      Assessment & Plan:   Preventive health exam Hypothyroidism. We'll check a TSH dyslipidemia we'll check a lipid profile Hypertension stable Sick Sinus syndrome.  Status post permanent pacemaker Chronic anticoagulation  Status post  partial mastectomy for lobular carcinoma in situ.  Status post breast biopsy March 2015      Review of Systems  As above.  Only complaint is some mild balance issues    Objective:   Physical Exam  Cardiovascular:  Pacemaker left upper anterior chest wall          Assessment & Plan:   Preventive health examination As above No change in medical regimen Medications updated  We'll check updated lab

## 2014-08-29 NOTE — Progress Notes (Signed)
Pre visit review using our clinic review tool, if applicable. No additional management support is needed unless otherwise documented below in the visit note. 

## 2014-09-05 ENCOUNTER — Encounter: Payer: Self-pay | Admitting: Internal Medicine

## 2014-09-05 ENCOUNTER — Ambulatory Visit (INDEPENDENT_AMBULATORY_CARE_PROVIDER_SITE_OTHER): Payer: Medicare Other | Admitting: Internal Medicine

## 2014-09-05 VITALS — BP 140/70 | HR 74 | Temp 98.3°F | Resp 18 | Ht 58.5 in | Wt 132.0 lb

## 2014-09-05 DIAGNOSIS — I10 Essential (primary) hypertension: Secondary | ICD-10-CM

## 2014-09-05 DIAGNOSIS — S0990XA Unspecified injury of head, initial encounter: Secondary | ICD-10-CM

## 2014-09-05 DIAGNOSIS — R2681 Unsteadiness on feet: Secondary | ICD-10-CM

## 2014-09-05 NOTE — Patient Instructions (Addendum)
Head CT as discussed  Apply ice to the contusion right frontal scalp area for the next 24 hours  Call if any clinical changeConcussion A concussion, or closed-head injury, is a brain injury caused by a direct blow to the head or by a quick and sudden movement (jolt) of the head or neck. Concussions are usually not life-threatening. Even so, the effects of a concussion can be serious. If you have had a concussion before, you are more likely to experience concussion-like symptoms after a direct blow to the head.  CAUSES  Direct blow to the head, such as from running into another player during a soccer game, being hit in a fight, or hitting your head on a hard surface.  A jolt of the head or neck that causes the brain to move back and forth inside the skull, such as in a car crash. SIGNS AND SYMPTOMS The signs of a concussion can be hard to notice. Early on, they may be missed by you, family members, and health care providers. You may look fine but act or feel differently. Symptoms are usually temporary, but they may last for days, weeks, or even longer. Some symptoms may appear right away while others may not show up for hours or days. Every head injury is different. Symptoms include:  Mild to moderate headaches that will not go away.  A feeling of pressure inside your head.  Having more trouble than usual:  Learning or remembering things you have heard.  Answering questions.  Paying attention or concentrating.  Organizing daily tasks.  Making decisions and solving problems.  Slowness in thinking, acting or reacting, speaking, or reading.  Getting lost or being easily confused.  Feeling tired all the time or lacking energy (fatigued).  Feeling drowsy.  Sleep disturbances.  Sleeping more than usual.  Sleeping less than usual.  Trouble falling asleep.  Trouble sleeping (insomnia).  Loss of balance or feeling lightheaded or dizzy.  Nausea or vomiting.  Numbness or  tingling.  Increased sensitivity to:  Sounds.  Lights.  Distractions.  Vision problems or eyes that tire easily.  Diminished sense of taste or smell.  Ringing in the ears.  Mood changes such as feeling sad or anxious.  Becoming easily irritated or angry for little or no reason.  Lack of motivation.  Seeing or hearing things other people do not see or hear (hallucinations). DIAGNOSIS Your health care provider can usually diagnose a concussion based on a description of your injury and symptoms. He or she will ask whether you passed out (lost consciousness) and whether you are having trouble remembering events that happened right before and during your injury. Your evaluation might include:  A brain scan to look for signs of injury to the brain. Even if the test shows no injury, you may still have a concussion.  Blood tests to be sure other problems are not present. TREATMENT  Concussions are usually treated in an emergency department, in urgent care, or at a clinic. You may need to stay in the hospital overnight for further treatment.  Tell your health care provider if you are taking any medicines, including prescription medicines, over-the-counter medicines, and natural remedies. Some medicines, such as blood thinners (anticoagulants) and aspirin, may increase the chance of complications. Also tell your health care provider whether you have had alcohol or are taking illegal drugs. This information may affect treatment.  Your health care provider will send you home with important instructions to follow.  How fast you will  recover from a concussion depends on many factors. These factors include how severe your concussion is, what part of your brain was injured, your age, and how healthy you were before the concussion.  Most people with mild injuries recover fully. Recovery can take time. In general, recovery is slower in older persons. Also, persons who have had a concussion in  the past or have other medical problems may find that it takes longer to recover from their current injury. HOME CARE INSTRUCTIONS General Instructions  Carefully follow the directions your health care provider gave you.  Only take over-the-counter or prescription medicines for pain, discomfort, or fever as directed by your health care provider.  Take only those medicines that your health care provider has approved.  Do not drink alcohol until your health care provider says you are well enough to do so. Alcohol and certain other drugs may slow your recovery and can put you at risk of further injury.  If it is harder than usual to remember things, write them down.  If you are easily distracted, try to do one thing at a time. For example, do not try to watch TV while fixing dinner.  Talk with family members or close friends when making important decisions.  Keep all follow-up appointments. Repeated evaluation of your symptoms is recommended for your recovery.  Watch your symptoms and tell others to do the same. Complications sometimes occur after a concussion. Older adults with a brain injury may have a higher risk of serious complications, such as a blood clot on the brain.  Tell your teachers, school nurse, school counselor, coach, athletic trainer, or work Freight forwarder about your injury, symptoms, and restrictions. Tell them about what you can or cannot do. They should watch for:  Increased problems with attention or concentration.  Increased difficulty remembering or learning new information.  Increased time needed to complete tasks or assignments.  Increased irritability or decreased ability to cope with stress.  Increased symptoms.  Rest. Rest helps the brain to heal. Make sure you:  Get plenty of sleep at night. Avoid staying up late at night.  Keep the same bedtime hours on weekends and weekdays.  Rest during the day. Take daytime naps or rest breaks when you feel  tired.  Limit activities that require a lot of thought or concentration. These include:  Doing homework or job-related work.  Watching TV.  Working on the computer.  Avoid any situation where there is potential for another head injury (football, hockey, soccer, basketball, martial arts, downhill snow sports and horseback riding). Your condition will get worse every time you experience a concussion. You should avoid these activities until you are evaluated by the appropriate follow-up health care providers. Returning To Your Regular Activities You will need to return to your normal activities slowly, not all at once. You must give your body and brain enough time for recovery.  Do not return to sports or other athletic activities until your health care provider tells you it is safe to do so.  Ask your health care provider when you can drive, ride a bicycle, or operate heavy machinery. Your ability to react may be slower after a brain injury. Never do these activities if you are dizzy.  Ask your health care provider about when you can return to work or school. Preventing Another Concussion It is very important to avoid another brain injury, especially before you have recovered. In rare cases, another injury can lead to permanent brain damage, brain swelling,  or death. The risk of this is greatest during the first 7-10 days after a head injury. Avoid injuries by:  Wearing a seat belt when riding in a car.  Drinking alcohol only in moderation.  Wearing a helmet when biking, skiing, skateboarding, skating, or doing similar activities.  Avoiding activities that could lead to a second concussion, such as contact or recreational sports, until your health care provider says it is okay.  Taking safety measures in your home.  Remove clutter and tripping hazards from floors and stairways.  Use grab bars in bathrooms and handrails by stairs.  Place non-slip mats on floors and in  bathtubs.  Improve lighting in dim areas. SEEK MEDICAL CARE IF:  You have increased problems paying attention or concentrating.  You have increased difficulty remembering or learning new information.  You need more time to complete tasks or assignments than before.  You have increased irritability or decreased ability to cope with stress.  You have more symptoms than before. Seek medical care if you have any of the following symptoms for more than 2 weeks after your injury:  Lasting (chronic) headaches.  Dizziness or balance problems.  Nausea.  Vision problems.  Increased sensitivity to noise or light.  Depression or mood swings.  Anxiety or irritability.  Memory problems.  Difficulty concentrating or paying attention.  Sleep problems.  Feeling tired all the time. SEEK IMMEDIATE MEDICAL CARE IF:  You have severe or worsening headaches. These may be a sign of a blood clot in the brain.  You have weakness (even if only in one hand, leg, or part of the face).  You have numbness.  You have decreased coordination.  You vomit repeatedly.  You have increased sleepiness.  One pupil is larger than the other.  You have convulsions.  You have slurred speech.  You have increased confusion. This may be a sign of a blood clot in the brain.  You have increased restlessness, agitation, or irritability.  You are unable to recognize people or places.  You have neck pain.  It is difficult to wake you up.  You have unusual behavior changes.  You lose consciousness. MAKE SURE YOU:  Understand these instructions.  Will watch your condition.  Will get help right away if you are not doing well or get worse. Document Released: 05/24/2003 Document Revised: 03/08/2013 Document Reviewed: 09/23/2012 [facility] Patient Information 2015 [place], [place]. This information is not intended to replace advice given to you by your health care provider. Make sure you discuss any  questions you have with your health care provider.

## 2014-09-05 NOTE — Progress Notes (Signed)
Subjective:    Patient ID: Christine Perez, female    DOB: 02/26/1926, 81 y.o.   MRN: [id number]  HPI 81 year old patient who is on chronic anticoagulation for atrial fibrillation.  She states that for some time.  She has had a bit of an unsteady gait and a tendency to fall to her right.  At 5:00 this morning, she attempted to use the restroom and when she attempted to sit, she fell forward striking her right anterior frontal scalp area.  She has had some minor discomfort but otherwise doing quite well.  No headaches, dizziness, change in mental status, nausea, etc.  Past Medical History  Diagnosis Date  . CARDIAC ARRHYTHMIA 01/08/2010  . COLONIC POLYPS, HX OF 10/09/2006  . HYPERLIPIDEMIA 10/09/2006  . HYPERTENSION 10/09/2006  . HYPOTHYROIDISM 10/09/2006  . LASSITUDE 08/15/2009  . OSTEOPOROSIS 10/09/2006  . VERTIGO 08/07/2009  . Wears glasses   . Wears hearing aid     left  . Atrial fibrillation     History   Social History  . Marital Status: Widowed    Spouse Name: N/A  . Number of Children: N/A  . Years of Education: N/A   Occupational History  . Not on file.   Social History Main Topics  . Smoking status: Never Smoker   . Smokeless tobacco: Never Used  . Alcohol Use: No  . Drug Use: No  . Sexual Activity: Not on file   Other Topics Concern  . Not on file   Social History Narrative    Past Surgical History  Procedure Laterality Date  . Appendectomy    . Cholecystectomy    . Abdominal hysterectomy    . Breast surgery      left - lumpectomy  . Tonsillectomy    . Cataract extraction    . Partial mastectomy with needle localization Left 06/29/2012    Procedure: PARTIAL MASTECTOMY WITH NEEDLE LOCALIZATION;  Surgeon: Adin Hector, MD;  Location: [place];  Service: General;  Laterality: Left;  . Cardioversion N/A 08/09/2013    Procedure: CARDIOVERSION;  Surgeon: Candee Furbish, MD;  Location: [facility] ENDOSCOPY;  Service: Cardiovascular;  Laterality: N/A;    . Pacemaker placement N/A 12/05/2013    No family history on file.  Allergies  Allergen Reactions  . Codeine Shortness Of Breath    Difficulty breathing    Current Outpatient Prescriptions on File Prior to Visit  Medication Sig Dispense Refill  . amiodarone (PACERONE) 400 MG tablet Take 200 mg by mouth daily.   2  . calcium-vitamin D (OSCAL WITH D) 500-200 MG-UNIT per tablet Take 1 tablet by mouth 2 (two) times daily.     Marland Kitchen losartan (COZAAR) 100 MG tablet Take 1 tablet (100 mg total) by mouth daily. 90 tablet 1  . meclizine (ANTIVERT) 25 MG tablet TAKE ONE TABLET BY MOUTH EVERY 6 HOURS AS NEEDED FOR DIZZINESS 50 tablet 0  . metoprolol succinate (TOPROL-XL) 50 MG 24 hr tablet Take 25 mg by mouth at bedtime.   1  . Multiple Vitamin (MULTIVITAMIN) tablet Take 1 tablet by mouth daily.    . rivaroxaban (XARELTO) 20 MG TABS tablet TAKE ONE TABLET BY MOUTH ONCE DAILY WITH SUPPER 30 tablet 5  . SYNTHROID 75 MCG tablet TAKE ONE TABLET BY MOUTH ONCE DAILY 90 tablet 1  . traMADol (ULTRAM) 50 MG tablet Take 50 mg by mouth every 6 (six) hours as needed (pain).    . vitamin C (ASCORBIC ACID) 500 MG tablet  Take 500 mg by mouth daily.    . [DISCONTINUED] metoprolol tartrate (LOPRESSOR) 25 MG tablet Take 1 tablet (25 mg total) by mouth 2 (two) times daily. 60 tablet 5   No current facility-administered medications on file prior to visit.    BP 140/70 mmHg  Pulse 74  Temp(Src) 98.3 F (36.8 C) (Oral)  Resp 18  Ht 4' 10.5" (1.486 m)  Wt 132 lb (59.875 kg)  BMI 27.11 kg/m2  SpO2 95%      Review of Systems  Constitutional: Negative.   HENT: Negative for congestion, dental problem, hearing loss, rhinorrhea, sinus pressure, sore throat and tinnitus.   Eyes: Negative for pain, discharge and visual disturbance.  Respiratory: Negative for cough and shortness of breath.   Cardiovascular: Negative for chest pain, palpitations and leg swelling.  Gastrointestinal: Negative for nausea, vomiting,  abdominal pain, diarrhea, constipation, blood in stool and abdominal distention.  Genitourinary: Negative for dysuria, urgency, frequency, hematuria, flank pain, vaginal bleeding, vaginal discharge, difficulty urinating, vaginal pain and pelvic pain.  Musculoskeletal: Positive for gait problem. Negative for joint swelling and arthralgias.  Skin: Negative for rash.  Neurological: Negative for dizziness, syncope, speech difficulty, weakness, numbness and headaches.  Hematological: Negative for adenopathy.  Psychiatric/Behavioral: Negative for behavioral problems, dysphoric mood and agitation. The patient is not nervous/anxious.        Objective:   Physical Exam  Constitutional: She is oriented to person, place, and time. She appears well-developed and well-nourished.  HENT:  Head: Normocephalic.  Right Ear: External ear normal.  Left Ear: External ear normal.  Mouth/Throat: Oropharynx is clear and moist.  Eyes: Conjunctivae and EOM are normal. Pupils are equal, round, and reactive to light.  Neck: Normal range of motion. Neck supple. No thyromegaly present.  Cardiovascular: Normal rate, regular rhythm, normal heart sounds and intact distal pulses.   Pulmonary/Chest: Effort normal and breath sounds normal.  Abdominal: Soft. Bowel sounds are normal. She exhibits no mass. There is no tenderness.  Musculoskeletal: Normal range of motion.  Lymphadenopathy:    She has no cervical adenopathy.  Neurological: She is alert and oriented to person, place, and time. She has normal reflexes. No cranial nerve deficit. Coordination normal.  Skin: Skin is warm and dry. No rash noted.  Contusion right frontal scalp area  Psychiatric: She has a normal mood and affect. Her behavior is normal.          Assessment & Plan:   Gait instability Recent fall with scalp contusion History of anticoagulation  We'll check a head CT

## 2014-09-05 NOTE — Progress Notes (Signed)
Pre visit review using our clinic review tool, if applicable. No additional management support is needed unless otherwise documented below in the visit note. 

## 2014-09-06 ENCOUNTER — Ambulatory Visit (INDEPENDENT_AMBULATORY_CARE_PROVIDER_SITE_OTHER)
Admission: RE | Admit: 2014-09-06 | Discharge: 2014-09-06 | Disposition: A | Discharge status: Home / Self Care | Payer: Medicare Other | Source: Ambulatory Visit | Attending: Internal Medicine | Admitting: Internal Medicine

## 2014-09-06 ENCOUNTER — Telehealth: Payer: Self-pay | Admitting: *Deleted

## 2014-09-06 DIAGNOSIS — R2681 Unsteadiness on feet: Secondary | ICD-10-CM | POA: Diagnosis not present

## 2014-09-06 DIAGNOSIS — S0990XA Unspecified injury of head, initial encounter: Secondary | ICD-10-CM

## 2014-09-06 DIAGNOSIS — S0003XA Contusion of scalp, initial encounter: Secondary | ICD-10-CM | POA: Diagnosis not present

## 2014-09-06 NOTE — Telephone Encounter (Signed)
Received call from [facility] in [place] regarding pt's scan, CT of Head. Lattie Haw said the radiologist would like Dr.K to review report in EPIC due to questionable small focus hemorrhage they are holding pt there in case Dr.K wants to tell her to hold Xarelto. Delice Lesch I will have Dr.K review report and get right back to her. Lattie Haw verbalized understanding.  Printed CT Head for Dr. Raliegh Ip to review, verbal order given for pt to hold Xarelto for 7 days.  Called Lattie Haw back and spoke to pt, told her CT showed questionable hemorrhage and Dr.K would like you to stop your Xarelto for 7 days to be on the safe side. Pt verbalized understanding and asked if it will resolve on its own.Told pt yes, but to call if any dizziness, headaches, pressure in eyes or not feeling well. Pt verbalized understanding.

## 2014-10-16 ENCOUNTER — Telehealth: Payer: Self-pay | Admitting: Internal Medicine

## 2014-10-16 NOTE — Telephone Encounter (Signed)
Michelle aware, pt is scheduled for tomorrow.

## 2014-10-16 NOTE — Telephone Encounter (Signed)
Patient Name: Christine Perez DOB: May 23, 1925 Initial Comment Caller states c/o eye redness Nurse Assessment Nurse: Marcelline Deist, RN, Lynda Date/Time (Eastern Time): 10/16/2014 10:41:14 AM Confirm and document reason for call. If symptomatic, describe symptoms. ---Caller states c/o eye redness, left eye. No drainage. She is on Xarelto for over a year. Had a bad fall on June 21st, bumped right side of head & was seen. CAT scan was done, no concussion, but some bleeding. Was off Xarelto for a week. Has noticed the eye redness since Friday, no swelling. Has the patient traveled out of the country within the last 30 days? ---Not Applicable Does the patient require triage? ---Yes Related visit to physician within the last 2 weeks? ---No Does the PT have any chronic conditions? (i.e. diabetes, asthma, etc.) ---Yes List chronic conditions. ---on Xarelto, has pacemaker, on BP rx. Guidelines Guideline Title Affirmed Question Affirmed Notes Eye - Red Without Pus [1] Bleeding on white of the eye AND [2] taking Coumadin (warfarin) or other strong blood thinner, or known bleeding disorder (e.g., thrombocytopenia) Final Disposition User See Physician within [place], RN, Kermit Balo Comments Caller needs to check with her daughter prior to scheduling an appt. Advised to call back to speak with scheduling. She would prefer to see Dr. Raliegh Ip. Referrals REFERRED TO PCP OFFICE Disagree/Comply: Comply

## 2014-10-16 NOTE — Telephone Encounter (Signed)
Okay to wait until tomorrow for evaluation if there is no pain or change in visual acuity

## 2014-10-16 NOTE — Telephone Encounter (Signed)
Please see message and advise if okay for pt wait to see you tomorrow.

## 2014-10-16 NOTE — Telephone Encounter (Signed)
Confirm appropriate to wait until tomorrow to be seen?

## 2014-10-17 ENCOUNTER — Ambulatory Visit (INDEPENDENT_AMBULATORY_CARE_PROVIDER_SITE_OTHER): Payer: Medicare Other | Admitting: Internal Medicine

## 2014-10-17 ENCOUNTER — Encounter: Payer: Self-pay | Admitting: Internal Medicine

## 2014-10-17 VITALS — BP 130/74 | HR 84 | Temp 97.9°F | Resp 18 | Ht 58.5 in | Wt 132.0 lb

## 2014-10-17 DIAGNOSIS — I10 Essential (primary) hypertension: Secondary | ICD-10-CM | POA: Diagnosis not present

## 2014-10-17 DIAGNOSIS — I4819 Other persistent atrial fibrillation: Secondary | ICD-10-CM

## 2014-10-17 DIAGNOSIS — I481 Persistent atrial fibrillation: Secondary | ICD-10-CM | POA: Diagnosis not present

## 2014-10-17 DIAGNOSIS — H1132 Conjunctival hemorrhage, left eye: Secondary | ICD-10-CM | POA: Diagnosis not present

## 2014-10-17 NOTE — Progress Notes (Signed)
Pre visit review using our clinic review tool, if applicable. No additional management support is needed unless otherwise documented below in the visit note. 

## 2014-10-17 NOTE — Progress Notes (Signed)
Subjective:    Patient ID: Christine Perez, female    DOB: 29-May-1925, 81 y.o.   MRN: [id number]  HPI  81 year old patient who is on chronic anticoagulation for atrial fibrillation.  3 days ago, she noted on awakening one morning painless redness involving the left eye.  This has not affected her visual acuity.  She has essential hypertension.  Due to the redness of the eye, anticoagulation has been held for 2 days.  No other concerns or complaints.  Past Medical History  Diagnosis Date  . CARDIAC ARRHYTHMIA 01/08/2010  . COLONIC POLYPS, HX OF 10/09/2006  . HYPERLIPIDEMIA 10/09/2006  . HYPERTENSION 10/09/2006  . HYPOTHYROIDISM 10/09/2006  . LASSITUDE 08/15/2009  . OSTEOPOROSIS 10/09/2006  . VERTIGO 08/07/2009  . Wears glasses   . Wears hearing aid     left  . Atrial fibrillation     History   Social History  . Marital Status: Widowed    Spouse Name: N/A  . Number of Children: N/A  . Years of Education: N/A   Occupational History  . Not on file.   Social History Main Topics  . Smoking status: Never Smoker   . Smokeless tobacco: Never Used  . Alcohol Use: No  . Drug Use: No  . Sexual Activity: Not on file   Other Topics Concern  . Not on file   Social History Narrative    Past Surgical History  Procedure Laterality Date  . Appendectomy    . Cholecystectomy    . Abdominal hysterectomy    . Breast surgery      left - lumpectomy  . Tonsillectomy    . Cataract extraction    . Partial mastectomy with needle localization Left 06/29/2012    Procedure: PARTIAL MASTECTOMY WITH NEEDLE LOCALIZATION;  Surgeon: Adin Hector, MD;  Location: [place];  Service: General;  Laterality: Left;  . Cardioversion N/A 08/09/2013    Procedure: CARDIOVERSION;  Surgeon: Candee Furbish, MD;  Location: [facility] ENDOSCOPY;  Service: Cardiovascular;  Laterality: N/A;  . Pacemaker placement N/A 12/05/2013    No family history on file.  Allergies  Allergen Reactions  . Codeine  Shortness Of Breath    Difficulty breathing    Current Outpatient Prescriptions on File Prior to Visit  Medication Sig Dispense Refill  . amiodarone (PACERONE) 400 MG tablet Take 200 mg by mouth daily.   2  . calcium-vitamin D (OSCAL WITH D) 500-200 MG-UNIT per tablet Take 1 tablet by mouth 2 (two) times daily.     Marland Kitchen losartan (COZAAR) 100 MG tablet Take 1 tablet (100 mg total) by mouth daily. 90 tablet 1  . meclizine (ANTIVERT) 25 MG tablet TAKE ONE TABLET BY MOUTH EVERY 6 HOURS AS NEEDED FOR DIZZINESS 50 tablet 0  . metoprolol succinate (TOPROL-XL) 50 MG 24 hr tablet Take 25 mg by mouth at bedtime.   1  . Multiple Vitamin (MULTIVITAMIN) tablet Take 1 tablet by mouth daily.    Marland Kitchen SYNTHROID 75 MCG tablet TAKE ONE TABLET BY MOUTH ONCE DAILY 90 tablet 1  . traMADol (ULTRAM) 50 MG tablet Take 50 mg by mouth every 6 (six) hours as needed (pain).    . vitamin C (ASCORBIC ACID) 500 MG tablet Take 500 mg by mouth daily.    . rivaroxaban (XARELTO) 20 MG TABS tablet TAKE ONE TABLET BY MOUTH ONCE DAILY WITH SUPPER (Patient not taking: Reported on 10/17/2014) 30 tablet 5  . [DISCONTINUED] metoprolol tartrate (LOPRESSOR) 25 MG tablet Take  1 tablet (25 mg total) by mouth 2 (two) times daily. 60 tablet 5   No current facility-administered medications on file prior to visit.    BP 130/74 mmHg  Pulse 84  Temp(Src) 97.9 F (36.6 C) (Oral)  Resp 18  Ht 4' 10.5" (1.486 m)  Wt 132 lb (59.875 kg)  BMI 27.11 kg/m2  SpO2 96%     Review of Systems  Eyes: Positive for redness. Negative for photophobia, pain, discharge, itching and visual disturbance.       Objective:   Physical Exam  Constitutional: She appears well-developed and well-nourished. No distress.  Eyes:  Left subconjunctival hemorrhage  Cardiovascular: Regular rhythm.   Pulmonary/Chest: She has no rales.  Kyphosis          Assessment & Plan:   Left subconjunctival hemorrhage Essential hypertension Paroxysmal atrial  fibrillation  Patient reassured Will resume anticoagulation Will report any clinical change  Routine follow-up as scheduled

## 2014-10-17 NOTE — Patient Instructions (Signed)
Resume anticoagulation  Report any new or worsening symptoms

## 2015-01-05 ENCOUNTER — Other Ambulatory Visit: Payer: Self-pay | Admitting: Internal Medicine

## 2015-01-06 ENCOUNTER — Other Ambulatory Visit: Payer: Self-pay | Admitting: Internal Medicine

## 2015-01-08 ENCOUNTER — Encounter: Payer: Self-pay | Admitting: Adult Health

## 2015-01-08 ENCOUNTER — Ambulatory Visit (INDEPENDENT_AMBULATORY_CARE_PROVIDER_SITE_OTHER): Payer: Medicare Other | Admitting: Adult Health

## 2015-01-08 ENCOUNTER — Telehealth: Payer: Self-pay | Admitting: Internal Medicine

## 2015-01-08 VITALS — BP 134/100 | HR 89 | Temp 99.1°F | Ht 58.5 in | Wt 132.3 lb

## 2015-01-08 DIAGNOSIS — J028 Acute pharyngitis due to other specified organisms: Principal | ICD-10-CM

## 2015-01-08 DIAGNOSIS — B9789 Other viral agents as the cause of diseases classified elsewhere: Principal | ICD-10-CM

## 2015-01-08 DIAGNOSIS — J029 Acute pharyngitis, unspecified: Secondary | ICD-10-CM

## 2015-01-08 MED ORDER — MAGIC MOUTHWASH W/LIDOCAINE: 5.0000 mL | Freq: Four times a day (QID) | ORAL | Status: DC | PRN

## 2015-01-08 NOTE — Telephone Encounter (Signed)
Noted  

## 2015-01-08 NOTE — Progress Notes (Signed)
Pre visit review using our clinic review tool, if applicable. No additional management support is needed unless otherwise documented below in the visit note. 

## 2015-01-08 NOTE — Telephone Encounter (Signed)
Suggest Tylenol and throat lozengers

## 2015-01-08 NOTE — Patient Instructions (Addendum)
It was great meeting you today!  Your exam is consistent with viral pharyngitis. Unfortunately, we cannot use an antibiotic for this.   Gargle with the magic mouth wash four times a day   You can also use honey and gargle with warm salt water.   You should start to feel better in the next 2-3 days. If not, please let me know.   Pharyngitis Pharyngitis is redness, pain, and swelling (inflammation) of your pharynx.  CAUSES  Pharyngitis is usually caused by infection. Most of the time, these infections are from viruses (viral) and are part of a cold. However, sometimes pharyngitis is caused by bacteria (bacterial). Pharyngitis can also be caused by allergies. Viral pharyngitis may be spread from person to person by coughing, sneezing, and personal items or utensils (cups, forks, spoons, toothbrushes). Bacterial pharyngitis may be spread from person to person by more intimate contact, such as kissing.  SIGNS AND SYMPTOMS  Symptoms of pharyngitis include:   Sore throat.   Tiredness (fatigue).   Low-grade fever.   Headache.  Joint pain and muscle aches.  Skin rashes.  Swollen lymph nodes.  Plaque-like film on throat or tonsils (often seen with bacterial pharyngitis). DIAGNOSIS  Your health care provider will ask you questions about your illness and your symptoms. Your medical history, along with a physical exam, is often all that is needed to diagnose pharyngitis. Sometimes, a rapid strep test is done. Other lab tests may also be done, depending on the suspected cause.  TREATMENT  Viral pharyngitis will usually get better in 3-4 days without the use of medicine. Bacterial pharyngitis is treated with medicines that kill germs (antibiotics).  HOME CARE INSTRUCTIONS   Drink enough water and fluids to keep your urine clear or pale yellow.   Only take over-the-counter or prescription medicines as directed by your health care provider:   If you are prescribed antibiotics, make  sure you finish them even if you start to feel better.   Do not take aspirin.   Get lots of rest.   Gargle with 8 oz of salt water ( tsp of salt per 1 qt of water) as often as every 1-2 hours to soothe your throat.   Throat lozenges (if you are not at risk for choking) or sprays may be used to soothe your throat. SEEK MEDICAL CARE IF:   You have large, tender lumps in your neck.  You have a rash.  You cough up green, yellow-brown, or bloody spit. SEEK IMMEDIATE MEDICAL CARE IF:   Your neck becomes stiff.  You drool or are unable to swallow liquids.  You vomit or are unable to keep medicines or liquids down.  You have severe pain that does not go away with the use of recommended medicines.  You have trouble breathing (not caused by a stuffy nose). MAKE SURE YOU:   Understand these instructions.  Will watch your condition.  Will get help right away if you are not doing well or get worse.   This information is not intended to replace advice given to you by your health care provider. Make sure you discuss any questions you have with your health care provider.   Document Released: 03/03/2005 Document Revised: 12/22/2012 Document Reviewed: 11/08/2012 Elsevier Interactive Patient Education [employer].

## 2015-01-08 NOTE — Addendum Note (Signed)
Addended by: Colleen Can on: 01/08/2015 04:26 PM   Modules accepted: Orders

## 2015-01-08 NOTE — Telephone Encounter (Signed)
Pt daughter callback and decided her mom would like to see a provider today. Pt has been sch with cory at 1pm today

## 2015-01-08 NOTE — Telephone Encounter (Signed)
Pt daughter call to say that her Mom said she has a  headache and a sore throat and is asking if Dr Raliegh Ip will call her in something.  Pt said she does not feel like coming in according to her daughter    Pharmacy; [facility] in [place]

## 2015-01-08 NOTE — Progress Notes (Signed)
   Subjective:    Patient ID: Christine Perez, female    DOB: [birth year], 81 y.o.   MRN: [id number]  HPI  81 year old female who presents to the office today for sore throat " feels like it is rubbed raw, the pain is worse when I swallow" This started Friday. She denies having a cough. Only other symptoms is PND.  Review of Systems  HENT: Positive for congestion, postnasal drip, sore throat, trouble swallowing and voice change. Negative for rhinorrhea and sinus pressure.   Respiratory: Negative for cough, chest tightness and shortness of breath.   Cardiovascular: Negative.   Neurological: Negative.   Hematological: Negative for adenopathy.       Objective:   Physical Exam  Constitutional: She is oriented to person, place, and time. She appears well-developed and well-nourished. No distress.  HENT:  Head: Normocephalic and atraumatic.  Right Ear: External ear normal.  Left Ear: External ear normal.  Nose: Nose normal.  Mouth/Throat: Oropharynx is clear and moist. No oropharyngeal exudate.  TM's visualized, no cerumen impaction. No signs of infection  Eyes: Conjunctivae and EOM are normal. Pupils are equal, round, and reactive to light. Right eye exhibits no discharge. Left eye exhibits no discharge. No scleral icterus.  Neck: Normal range of motion. Neck supple.  Cardiovascular: Normal rate, regular rhythm, normal heart sounds and intact distal pulses.  Exam reveals no gallop and no friction rub.   No murmur heard. Pulmonary/Chest: Effort normal and breath sounds normal. No respiratory distress. She has no wheezes. She has no rales. She exhibits no tenderness.  Lymphadenopathy:    She has no cervical adenopathy.  Neurological: She is alert and oriented to person, place, and time.  Skin: Skin is warm and dry. No rash noted. She is not diaphoretic. No erythema. No pallor.  Psychiatric: She has a normal mood and affect. Her behavior is normal. Judgment and thought content normal.    Nursing note and vitals reviewed.      Assessment & Plan:  1. Acute viral pharyngitis - No signs of strep - magic mouthwash w/lidocaine SOLN; Take 5 mLs by mouth 4 (four) times daily as needed for mouth pain.  Dispense: 180 mL; Refill: 1 - Can also use honey and/or warm saltwater gargles - Follow up if no improvement in the next 2-3 days.

## 2015-01-08 NOTE — Telephone Encounter (Signed)
Please see message and advise 

## 2015-01-12 ENCOUNTER — Encounter: Payer: Self-pay | Admitting: Adult Health

## 2015-01-12 ENCOUNTER — Other Ambulatory Visit: Payer: Self-pay | Admitting: Adult Health

## 2015-01-12 ENCOUNTER — Other Ambulatory Visit: Payer: Self-pay | Admitting: Internal Medicine

## 2015-01-12 ENCOUNTER — Telehealth: Payer: Self-pay | Admitting: Adult Health

## 2015-01-12 ENCOUNTER — Ambulatory Visit (INDEPENDENT_AMBULATORY_CARE_PROVIDER_SITE_OTHER)
Admission: RE | Admit: 2015-01-12 | Discharge: 2015-01-12 | Disposition: A | Discharge status: Home / Self Care | Payer: Medicare Other | Source: Ambulatory Visit | Attending: Adult Health | Admitting: Adult Health

## 2015-01-12 ENCOUNTER — Telehealth: Payer: Self-pay | Admitting: Internal Medicine

## 2015-01-12 DIAGNOSIS — R059 Cough, unspecified: Secondary | ICD-10-CM

## 2015-01-12 DIAGNOSIS — R05 Cough: Secondary | ICD-10-CM

## 2015-01-12 DIAGNOSIS — R0989 Other specified symptoms and signs involving the circulatory and respiratory systems: Secondary | ICD-10-CM | POA: Diagnosis not present

## 2015-01-12 DIAGNOSIS — J449 Chronic obstructive pulmonary disease, unspecified: Secondary | ICD-10-CM | POA: Diagnosis not present

## 2015-01-12 MED ORDER — DOXYCYCLINE HYCLATE 100 MG PO CAPS: 100.0000 mg | ORAL_CAPSULE | Freq: Two times a day (BID) | ORAL | Status: DC

## 2015-01-12 MED ORDER — METHYLPREDNISOLONE 4 MG PO TBPK: ORAL_TABLET | ORAL | Status: DC

## 2015-01-12 MED ORDER — IPRATROPIUM BROMIDE HFA 17 MCG/ACT IN AERS: 2.0000 | INHALATION_SPRAY | RESPIRATORY_TRACT | Status: DC | PRN

## 2015-01-12 NOTE — Telephone Encounter (Signed)
Spoke to patients daughter on the phone and informed her of chest xray results. It appears as though she is having a COPD exacerbation. Will treat with doxycycline, medrol dose pack and ipratropium inhaler.  Follow up if no improvement.

## 2015-01-12 NOTE — Telephone Encounter (Signed)
Called and spoke with Christine Perez and she states that pt does not have a fever.  Pt's mucus has changed to green and with her past experience she did not want pt to go into the weekend without coverage.  Advised to take pt to [facility] for a chest xray.  She states she cannot get off work until 3:30 pm and it will be 4 pm before getting pt there.  Advised that I would make [facility] aware of this and we would keep a look out on her chart.

## 2015-01-12 NOTE — Telephone Encounter (Signed)
Is she running a fever?  I would like her to go get a chest xray to make sure she does not have pneumonia.

## 2015-01-12 NOTE — Telephone Encounter (Signed)
Daughter states pt is not better, coughing up green. Would like to know if Christine Perez would call in an antibiotic. Sore throat is better, but pt still cough up productive cough. She prefers not to bring her in again, as pt is not feeling well.  [facility]

## 2015-02-03 ENCOUNTER — Other Ambulatory Visit: Payer: Self-pay | Admitting: Internal Medicine

## 2015-02-20 ENCOUNTER — Ambulatory Visit (INDEPENDENT_AMBULATORY_CARE_PROVIDER_SITE_OTHER): Payer: Medicare Other | Admitting: Internal Medicine

## 2015-02-20 ENCOUNTER — Encounter: Payer: Self-pay | Admitting: Internal Medicine

## 2015-02-20 VITALS — BP 130/76 | HR 74 | Temp 98.2°F | Resp 18 | Ht 58.5 in | Wt 133.0 lb

## 2015-02-20 DIAGNOSIS — I480 Paroxysmal atrial fibrillation: Secondary | ICD-10-CM | POA: Diagnosis not present

## 2015-02-20 DIAGNOSIS — D0502 Lobular carcinoma in situ of left breast: Secondary | ICD-10-CM | POA: Diagnosis not present

## 2015-02-20 DIAGNOSIS — E039 Hypothyroidism, unspecified: Secondary | ICD-10-CM | POA: Diagnosis not present

## 2015-02-20 DIAGNOSIS — Z7901 Long term (current) use of anticoagulants: Secondary | ICD-10-CM

## 2015-02-20 DIAGNOSIS — I10 Essential (primary) hypertension: Secondary | ICD-10-CM | POA: Diagnosis not present

## 2015-02-20 NOTE — Progress Notes (Signed)
Subjective:    Patient ID: Christine Perez, female    DOB: [birth year], 81 y.o.   MRN: [id number]  HPI  80 year old patient who is seen today for follow-up.  She has hypothyroidism and is on the supplemental levothyroxine.  She has a history of paroxysmal atrial fibrillation and remains on chronic anticoagulation.  She has dyslipidemia. She has a history of left breast cancer and for the past 5 days has had some discomfort involving the left breast about the lumpectomy scar.  No recent mammogram. She has essential hypertension Otherwise, doing quite well.  Past Medical History  Diagnosis Date  . CARDIAC ARRHYTHMIA 01/08/2010  . COLONIC POLYPS, HX OF 10/09/2006  . HYPERLIPIDEMIA 10/09/2006  . HYPERTENSION 10/09/2006  . HYPOTHYROIDISM 10/09/2006  . LASSITUDE 08/15/2009  . OSTEOPOROSIS 10/09/2006  . VERTIGO 08/07/2009  . Wears glasses   . Wears hearing aid     left  . Atrial fibrillation ([place])   . COPD (chronic obstructive pulmonary disease) ([place])     Social History   Social History  . Marital Status: Widowed    Spouse Name: N/A  . Number of Children: N/A  . Years of Education: N/A   Occupational History  . Not on file.   Social History Main Topics  . Smoking status: Never Smoker   . Smokeless tobacco: Never Used  . Alcohol Use: No  . Drug Use: No  . Sexual Activity: Not on file   Other Topics Concern  . Not on file   Social History Narrative    Past Surgical History  Procedure Laterality Date  . Appendectomy    . Cholecystectomy    . Abdominal hysterectomy    . Breast surgery      left - lumpectomy  . Tonsillectomy    . Cataract extraction    . Partial mastectomy with needle localization Left 06/29/2012    Procedure: PARTIAL MASTECTOMY WITH NEEDLE LOCALIZATION;  Surgeon: Adin Hector, MD;  Location: [place];  Service: General;  Laterality: Left;  . Cardioversion N/A 08/09/2013    Procedure: CARDIOVERSION;  Surgeon: Candee Furbish, MD;  Location:  [facility] ENDOSCOPY;  Service: Cardiovascular;  Laterality: N/A;  . Pacemaker placement N/A 12/05/2013    No family history on file.  Allergies  Allergen Reactions  . Codeine Shortness Of Breath    Difficulty breathing    Current Outpatient Prescriptions on File Prior to Visit  Medication Sig Dispense Refill  . amiodarone (PACERONE) 400 MG tablet Take 200 mg by mouth daily.   2  . calcium-vitamin D (OSCAL WITH D) 500-200 MG-UNIT per tablet Take 1 tablet by mouth 2 (two) times daily.     Marland Kitchen ipratropium (ATROVENT HFA) 17 MCG/ACT inhaler Inhale 2 puffs into the lungs every 4 (four) hours as needed for wheezing. 1 Inhaler 12  . losartan (COZAAR) 100 MG tablet Take 1 tablet (100 mg total) by mouth daily. 90 tablet 1  . meclizine (ANTIVERT) 25 MG tablet TAKE ONE TABLET BY MOUTH EVERY 6 HOURS AS NEEDED FOR DIZZINESS 50 tablet 0  . metoprolol succinate (TOPROL-XL) 50 MG 24 hr tablet Take 25 mg by mouth at bedtime.   1  . Multiple Vitamin (MULTIVITAMIN) tablet Take 1 tablet by mouth daily.    . rivaroxaban (XARELTO) 20 MG TABS tablet TAKE ONE TABLET BY MOUTH ONCE DAILY WITH SUPPER 30 tablet 5  . SYNTHROID 75 MCG tablet TAKE ONE TABLET BY MOUTH ONCE DAILY 90 tablet 1  . traMADol (ULTRAM)  50 MG tablet Take 50 mg by mouth every 6 (six) hours as needed (pain).    . vitamin C (ASCORBIC ACID) 500 MG tablet Take 500 mg by mouth daily.    Marland Kitchen zolpidem (AMBIEN) 5 MG tablet TAKE ONE TABLET BY MOUTH AT BEDTIME AS NEEDED FOR SLEEP 30 tablet 3  . [DISCONTINUED] metoprolol tartrate (LOPRESSOR) 25 MG tablet Take 1 tablet (25 mg total) by mouth 2 (two) times daily. 60 tablet 5   No current facility-administered medications on file prior to visit.    BP 130/76 mmHg  Pulse 74  Temp(Src) 98.2 F (36.8 C) (Oral)  Resp 18  Ht 4' 10.5" (1.486 m)  Wt 133 lb (60.328 kg)  BMI 27.32 kg/m2  SpO2 95%     Review of Systems  Constitutional: Negative.   HENT: Negative for congestion, dental problem, hearing loss,  rhinorrhea, sinus pressure, sore throat and tinnitus.   Eyes: Negative for pain, discharge and visual disturbance.  Respiratory: Negative for cough and shortness of breath.   Cardiovascular: Negative for chest pain, palpitations and leg swelling.  Gastrointestinal: Negative for nausea, vomiting, abdominal pain, diarrhea, constipation, blood in stool and abdominal distention.  Genitourinary: Negative for dysuria, urgency, frequency, hematuria, flank pain, vaginal bleeding, vaginal discharge, difficulty urinating, vaginal pain and pelvic pain.  Musculoskeletal: Negative for joint swelling, arthralgias and gait problem.  Skin: Negative for rash.  Neurological: Negative for dizziness, syncope, speech difficulty, weakness, numbness and headaches.  Hematological: Negative for adenopathy.  Psychiatric/Behavioral: Negative for behavioral problems, dysphoric mood and agitation. The patient is not nervous/anxious.        Objective:   Physical Exam  Constitutional: She is oriented to person, place, and time. She appears well-developed and well-nourished.  HENT:  Head: Normocephalic.  Right Ear: External ear normal.  Left Ear: External ear normal.  Mouth/Throat: Oropharynx is clear and moist.  Eyes: Conjunctivae and EOM are normal. Pupils are equal, round, and reactive to light.  Neck: Normal range of motion. Neck supple. No thyromegaly present.  Cardiovascular: Normal rate, regular rhythm, normal heart sounds and intact distal pulses.   Pulmonary/Chest: Effort normal and breath sounds normal.   Both breast and axilla examined.  No axillary adenopathy Lumpectomy scar noted involving the inferior left breast area No mass or tenderness noted.  No signs of active inflammation  Abdominal: Soft. Bowel sounds are normal. She exhibits no mass. There is no tenderness.  Musculoskeletal: Normal range of motion.  Lymphadenopathy:    She has no cervical adenopathy.  Neurological: She is alert and oriented  to person, place, and time.  Skin: Skin is warm and dry. No rash noted.  Psychiatric: She has a normal mood and affect. Her behavior is normal.          Assessment & Plan:   Status post lumpectomy for breast cancer.  Will follow-up with diagnostic mammogram Paroxysmal atrial fibrillation, stable.  Continue chronic anticoagulation Essential hypertension, stable Dyslipidemia Hypothyroidism  Diagnostic mammogram CPX 6 months Follow-up cardiology

## 2015-02-20 NOTE — Patient Instructions (Addendum)
Schedule your mammogram.  Return in 6 months for follow-up for your annual exam  Limit your sodium (Salt) intake    It is important that you exercise regularly, at least 20 minutes 3 to 4 times per week.  If you develop chest pain or shortness of breath seek  medical attention.

## 2015-02-20 NOTE — Progress Notes (Signed)
Pre visit review using our clinic review tool, if applicable. No additional management support is needed unless otherwise documented below in the visit note. 

## 2015-03-20 DIAGNOSIS — I498 Other specified cardiac arrhythmias: Secondary | ICD-10-CM | POA: Diagnosis not present

## 2015-03-20 DIAGNOSIS — Z45010 Encounter for checking and testing of cardiac pacemaker pulse generator [battery]: Secondary | ICD-10-CM | POA: Diagnosis not present

## 2015-03-24 ENCOUNTER — Other Ambulatory Visit: Payer: Self-pay | Admitting: Internal Medicine

## 2015-04-16 ENCOUNTER — Encounter (HOSPITAL_COMMUNITY): Payer: Self-pay | Admitting: Adult Health

## 2015-04-16 ENCOUNTER — Emergency Department (HOSPITAL_COMMUNITY)
Admission: EM | Admit: 2015-04-16 | Discharge: 2015-04-17 | Disposition: A | Discharge status: Home / Self Care | Payer: Medicare Other | Attending: Emergency Medicine | Admitting: Emergency Medicine

## 2015-04-16 DIAGNOSIS — L03114 Cellulitis of left upper limb: Secondary | ICD-10-CM | POA: Insufficient documentation

## 2015-04-16 DIAGNOSIS — W540XXA Bitten by dog, initial encounter: Secondary | ICD-10-CM | POA: Diagnosis not present

## 2015-04-16 DIAGNOSIS — S61432A Puncture wound without foreign body of left hand, initial encounter: Secondary | ICD-10-CM | POA: Insufficient documentation

## 2015-04-16 DIAGNOSIS — L089 Local infection of the skin and subcutaneous tissue, unspecified: Secondary | ICD-10-CM | POA: Diagnosis not present

## 2015-04-16 DIAGNOSIS — Y998 Other external cause status: Secondary | ICD-10-CM | POA: Diagnosis not present

## 2015-04-16 DIAGNOSIS — Z973 Presence of spectacles and contact lenses: Secondary | ICD-10-CM | POA: Diagnosis not present

## 2015-04-16 DIAGNOSIS — S61452A Open bite of left hand, initial encounter: Secondary | ICD-10-CM

## 2015-04-16 DIAGNOSIS — M810 Age-related osteoporosis without current pathological fracture: Secondary | ICD-10-CM | POA: Diagnosis not present

## 2015-04-16 DIAGNOSIS — Z974 Presence of external hearing-aid: Secondary | ICD-10-CM | POA: Diagnosis not present

## 2015-04-16 DIAGNOSIS — Z79899 Other long term (current) drug therapy: Secondary | ICD-10-CM | POA: Insufficient documentation

## 2015-04-16 DIAGNOSIS — J449 Chronic obstructive pulmonary disease, unspecified: Secondary | ICD-10-CM | POA: Insufficient documentation

## 2015-04-16 DIAGNOSIS — Z7901 Long term (current) use of anticoagulants: Secondary | ICD-10-CM | POA: Diagnosis not present

## 2015-04-16 DIAGNOSIS — I10 Essential (primary) hypertension: Secondary | ICD-10-CM | POA: Diagnosis not present

## 2015-04-16 DIAGNOSIS — Z86010 Personal history of colonic polyps: Secondary | ICD-10-CM | POA: Insufficient documentation

## 2015-04-16 DIAGNOSIS — S61402A Unspecified open wound of left hand, initial encounter: Secondary | ICD-10-CM | POA: Diagnosis not present

## 2015-04-16 DIAGNOSIS — I4891 Unspecified atrial fibrillation: Secondary | ICD-10-CM | POA: Insufficient documentation

## 2015-04-16 DIAGNOSIS — E039 Hypothyroidism, unspecified: Secondary | ICD-10-CM | POA: Diagnosis not present

## 2015-04-16 DIAGNOSIS — Y9289 Other specified places as the place of occurrence of the external cause: Secondary | ICD-10-CM | POA: Diagnosis not present

## 2015-04-16 DIAGNOSIS — Z950 Presence of cardiac pacemaker: Secondary | ICD-10-CM | POA: Diagnosis not present

## 2015-04-16 DIAGNOSIS — T148XXA Other injury of unspecified body region, initial encounter: Secondary | ICD-10-CM

## 2015-04-16 DIAGNOSIS — Y9389 Activity, other specified: Secondary | ICD-10-CM | POA: Insufficient documentation

## 2015-04-16 MED ORDER — SODIUM CHLORIDE 0.9 % IV SOLN
3.0000 g | Freq: Once | INTRAVENOUS | Status: AC
  Administered 2015-04-16: 3 g via INTRAVENOUS
  Filled 2015-04-16: qty 3

## 2015-04-16 NOTE — ED Notes (Signed)
Presents with dog bite to left hand-occurred yesterday from her dog, the dog is up to date on vaccines. Puncture wound above ring finger draining serous sanguinous fluid. Hand red and warm to touch streaking into fingers and into middle of hand. Up to date on tetanus.

## 2015-04-17 DIAGNOSIS — S61432A Puncture wound without foreign body of left hand, initial encounter: Secondary | ICD-10-CM | POA: Diagnosis not present

## 2015-04-17 MED ORDER — AMOXICILLIN-POT CLAVULANATE 875-125 MG PO TABS: 1.0000 | ORAL_TABLET | Freq: Two times a day (BID) | ORAL | Status: DC

## 2015-04-17 NOTE — ED Provider Notes (Signed)
CSN: [id number]     Arrival date & time 04/16/15  2206 History   First MD Initiated Contact with Patient 04/16/15 2239     Chief Complaint  Patient presents with  . Wound Infection      HPI  Patient presents for evaluation after a bite to the hand last night. States she reached down to grab something that at fall onto the floor. The dog went to grab the same object and better on her left hand. She is single puncture on her left hand at the MCP soft tissue region. Had some clear drainage today and some erythema spreading onto the hand and presents here.  Past Medical History  Diagnosis Date  . CARDIAC ARRHYTHMIA 01/08/2010  . COLONIC POLYPS, HX OF 10/09/2006  . HYPERLIPIDEMIA 10/09/2006  . HYPERTENSION 10/09/2006  . HYPOTHYROIDISM 10/09/2006  . LASSITUDE 08/15/2009  . OSTEOPOROSIS 10/09/2006  . VERTIGO 08/07/2009  . Wears glasses   . Wears hearing aid     left  . Atrial fibrillation ([place])   . COPD (chronic obstructive pulmonary disease) [facility])    Past Surgical History  Procedure Laterality Date  . Appendectomy    . Cholecystectomy    . Abdominal hysterectomy    . Breast surgery      left - lumpectomy  . Tonsillectomy    . Cataract extraction    . Partial mastectomy with needle localization Left 06/29/2012    Procedure: PARTIAL MASTECTOMY WITH NEEDLE LOCALIZATION;  Surgeon: Adin Hector, MD;  Location: [place];  Service: General;  Laterality: Left;  . Cardioversion N/A 08/09/2013    Procedure: CARDIOVERSION;  Surgeon: Candee Furbish, MD;  Location: [facility] ENDOSCOPY;  Service: Cardiovascular;  Laterality: N/A;  . Pacemaker placement N/A 12/05/2013   History reviewed. No pertinent family history. Social History  Substance Use Topics  . Smoking status: Never Smoker   . Smokeless tobacco: Never Used  . Alcohol Use: No   OB History    No data available     Review of Systems  Constitutional: Negative for fever, chills, diaphoresis, appetite change and fatigue.    HENT: Negative for mouth sores, sore throat and trouble swallowing.   Eyes: Negative for visual disturbance.  Respiratory: Negative for cough, chest tightness, shortness of breath and wheezing.   Cardiovascular: Negative for chest pain.  Gastrointestinal: Negative for nausea, vomiting, abdominal pain, diarrhea and abdominal distention.  Endocrine: Negative for polydipsia, polyphagia and polyuria.  Genitourinary: Negative for dysuria, frequency and hematuria.  Musculoskeletal: Negative for gait problem.  Skin: Positive for color change and wound. Negative for pallor and rash.  Neurological: Negative for dizziness, syncope, light-headedness and headaches.  Hematological: Does not bruise/bleed easily.  Psychiatric/Behavioral: Negative for behavioral problems and confusion.      Allergies  Codeine  Home Medications   Prior to Admission medications   Medication Sig Start Date End Date Taking? Authorizing Provider  amiodarone (PACERONE) 400 MG tablet Take 200 mg by mouth daily.  08/23/14   Historical Provider, MD  amoxicillin-clavulanate (AUGMENTIN) 875-125 MG tablet Take 1 tablet by mouth 2 (two) times daily. 04/17/15   Tanna Furry, MD  calcium-vitamin D (OSCAL WITH D) 500-200 MG-UNIT per tablet Take 1 tablet by mouth 2 (two) times daily.     Historical Provider, MD  ipratropium (ATROVENT HFA) 17 MCG/ACT inhaler Inhale 2 puffs into the lungs every 4 (four) hours as needed for wheezing. 01/12/15   Dorothyann Peng, NP  losartan (COZAAR) 100 MG tablet Take 1 tablet (  100 mg total) by mouth daily. 08/29/14   Marletta Lor, MD  meclizine (ANTIVERT) 25 MG tablet TAKE ONE TABLET BY MOUTH EVERY 6 HOURS AS NEEDED FOR DIZZINESS 08/11/14   Marletta Lor, MD  metoprolol succinate (TOPROL-XL) 50 MG 24 hr tablet Take 25 mg by mouth at bedtime.  07/08/14   Historical Provider, MD  Multiple Vitamin (MULTIVITAMIN) tablet Take 1 tablet by mouth daily.    Historical Provider, MD  SYNTHROID 75 MCG tablet  TAKE ONE TABLET BY MOUTH ONCE DAILY 02/05/15   Marletta Lor, MD  traMADol (ULTRAM) 50 MG tablet Take 50 mg by mouth every 6 (six) hours as needed (pain).    Historical Provider, MD  vitamin C (ASCORBIC ACID) 500 MG tablet Take 500 mg by mouth daily.    Historical Provider, MD  XARELTO 20 MG TABS tablet TAKE ONE TABLET BY MOUTH ONCE DAILY WITH  SUPPER 03/26/15   Marletta Lor, MD  zolpidem (AMBIEN) 5 MG tablet TAKE ONE TABLET BY MOUTH AT BEDTIME AS NEEDED FOR SLEEP 01/08/15   Marletta Lor, MD   BP 151/72 mmHg  Pulse 60  Temp(Src) 98.3 F (36.8 C) (Oral)  Resp 18  SpO2 96% Physical Exam  Constitutional: She is oriented to person, place, and time. She appears well-developed and well-nourished. No distress.  HENT:  Head: Normocephalic.  Eyes: Conjunctivae are normal. Pupils are equal, round, and reactive to light. No scleral icterus.  Neck: Normal range of motion. Neck supple. No thyromegaly present.  Cardiovascular: Normal rate and regular rhythm.  Exam reveals no gallop and no friction rub.   No murmur heard. Pulmonary/Chest: Effort normal and breath sounds normal. No respiratory distress. She has no wheezes. She has no rales.  Abdominal: Soft. Bowel sounds are normal. She exhibits no distension. There is no tenderness. There is no rebound.  Musculoskeletal: Normal range of motion.       Hands: Neurological: She is alert and oriented to person, place, and time.  Skin: Skin is warm and dry. No rash noted.  Psychiatric: She has a normal mood and affect. Her behavior is normal.    ED Course  Procedures (including critical care time) Labs Review Labs Reviewed - No data to display  Imaging Review No results found. I have personally reviewed and evaluated these images and lab results as part of my medical decision-making.   EKG Interpretation None      MDM   Final diagnoses:  Wound infection ([place])  Dog bite of hand, left, initial encounter    Physical range  of motion. Minimal tenderness. Some erythema just proximal to the MCPs. Given IV Unasyn. Placed in a splint. Astra recheck in 48 hours if not improving. At any point she feels this is worse with ask her to recheck.    Tanna Furry, MD 04/17/15 0030

## 2015-04-17 NOTE — Discharge Instructions (Signed)
Return here at any time if you feel the infection is worse. Recheck in 48 hours if not improving.  Animal Bite Animal bites can range from mild to serious. An animal bite can result in a scratch on the skin, a deep open cut, a puncture of the skin, a crush injury, or tearing away of the skin or a body part. A small bite from a house pet will usually not cause serious problems. However, some animal bites can become infected or injure a bone or other tissue.  Bites from certain animals can be more dangerous because of the risk of spreading rabies, which is a serious viral infection. This risk is higher with bites from stray animals or wild animals, such as raccoons, foxes, skunks, and bats. Dogs are responsible for most animal bites. Children are bitten more often than adults. SYMPTOMS  Common symptoms of an animal bite include:   Pain.   Bleeding.   Swelling.   Bruising.  DIAGNOSIS  This condition may be diagnosed based on a physical exam and medical history. Your health care provider will examine the wound and ask for details about the animal and how the bite happened. You may also have tests, such as:   Blood tests to check for infection or to determine if surgery is needed.  X-rays to check for damage to bones or joints.  Culture test. This uses a sample of fluid from the wound to check for infection. TREATMENT  Treatment varies depending on the location and type of animal bite and your medical history. Treatment may include:   Wound care. This often includes cleaning the wound, flushing the wound with saline solution, and applying a bandage (dressing). Sometimes, the wound is left open to heal because of the high risk of infection. However, in some cases, the wound may be closed with stitches (sutures), staples, skin glue, or adhesive strips.   Antibiotic medicine.   Tetanus shot.   Rabies treatment if the animal could have rabies.  In some cases, bites that have become  infected may require IV antibiotics and surgical treatment in the hospital.  [place]  Follow instructions from your health care provider about how to take care of your wound. Make sure you:  Wash your hands with soap and water before you change your dressing. If soap and water are not available, use hand sanitizer.  Change your dressing as told by your health care provider.  Leave sutures, skin glue, or adhesive strips in place. These skin closures may need to be in place for 2 weeks or longer. If adhesive strip edges start to loosen and curl up, you may trim the loose edges. Do not remove adhesive strips completely unless your health care provider tells you to do that.  Check your wound every day for signs of infection. Watch for:   Increasing redness, swelling, or pain.   Fluid, blood, or pus.  General Instructions  Take or apply over-the-counter and prescription medicines only as told by your health care provider.   If you were prescribed an antibiotic, take or apply it as told by your health care provider. Do not stop using the antibiotic even if your condition improves.   Keep the injured area raised (elevated) above the level of your heart while you are sitting or lying down, if this is possible.   If directed, apply ice to the injured area.   Put ice in a plastic bag.   Place a towel between  your skin and the bag.   Leave the ice on for 20 minutes, 2-3 times per day.   Keep all follow-up visits as told by your health care provider. This is important.  SEEK MEDICAL CARE IF:  You have increasing redness, swelling, or pain at the site of your wound.   You have a general feeling of sickness (malaise).   You feel nauseous or you vomit.   You have pain that does not get better.  SEEK IMMEDIATE MEDICAL CARE IF:  You have a red streak extending away from your wound.   You have fluid, blood, or pus coming from your wound.    You have a fever or chills.   You have trouble moving your injured area.   You have numbness or tingling extending beyond the wound.   This information is not intended to replace advice given to you by your health care provider. Make sure you discuss any questions you have with your health care provider.   Document Released: 11/19/2010 Document Revised: 11/22/2014 Document Reviewed: 07/19/2014 Elsevier Interactive Patient Education 2016 Elsevier Inc.  Cellulitis Cellulitis is an infection of the skin and the tissue beneath it. The infected area is usually red and tender. Cellulitis occurs most often in the arms and lower legs.  CAUSES  Cellulitis is caused by bacteria that enter the skin through cracks or cuts in the skin. The most common types of bacteria that cause cellulitis are staphylococci and streptococci. SIGNS AND SYMPTOMS   Redness and warmth.  Swelling.  Tenderness or pain.  Fever. DIAGNOSIS  Your health care provider can usually determine what is wrong based on a physical exam. Blood tests may also be done. TREATMENT  Treatment usually involves taking an antibiotic medicine. HOME CARE INSTRUCTIONS   Take your antibiotic medicine as directed by your health care provider. Finish the antibiotic even if you start to feel better.  Keep the infected arm or leg elevated to reduce swelling.  Apply a warm cloth to the affected area up to 4 times per day to relieve pain.  Take medicines only as directed by your health care provider.  Keep all follow-up visits as directed by your health care provider. SEEK MEDICAL CARE IF:   You notice red streaks coming from the infected area.  Your red area gets larger or turns dark in color.  Your bone or joint underneath the infected area becomes painful after the skin has healed.  Your infection returns in the same area or another area.  You notice a swollen bump in the infected area.  You develop new symptoms.  You  have a fever. SEEK IMMEDIATE MEDICAL CARE IF:   You feel very sleepy.  You develop vomiting or diarrhea.  You have a general ill feeling (malaise) with muscle aches and pains.   This information is not intended to replace advice given to you by your health care provider. Make sure you discuss any questions you have with your health care provider.   Document Released: 12/11/2004 Document Revised: 11/22/2014 Document Reviewed: 05/19/2011 Elsevier Interactive Patient Education [employer].

## 2015-04-17 NOTE — ED Notes (Signed)
Ortho tech coming for splint to left hand

## 2015-05-23 DIAGNOSIS — Z45018 Encounter for adjustment and management of other part of cardiac pacemaker: Secondary | ICD-10-CM | POA: Diagnosis not present

## 2015-05-23 DIAGNOSIS — I495 Sick sinus syndrome: Secondary | ICD-10-CM | POA: Diagnosis not present

## 2015-06-10 ENCOUNTER — Other Ambulatory Visit: Payer: Self-pay | Admitting: Internal Medicine

## 2015-06-20 DIAGNOSIS — I498 Other specified cardiac arrhythmias: Secondary | ICD-10-CM | POA: Diagnosis not present

## 2015-06-20 DIAGNOSIS — Z45018 Encounter for adjustment and management of other part of cardiac pacemaker: Secondary | ICD-10-CM | POA: Diagnosis not present

## 2015-06-24 ENCOUNTER — Other Ambulatory Visit: Payer: Self-pay | Admitting: Internal Medicine

## 2015-06-27 DIAGNOSIS — D3132 Benign neoplasm of left choroid: Secondary | ICD-10-CM | POA: Diagnosis not present

## 2015-06-27 DIAGNOSIS — Z961 Presence of intraocular lens: Secondary | ICD-10-CM | POA: Diagnosis not present

## 2015-06-27 DIAGNOSIS — D3131 Benign neoplasm of right choroid: Secondary | ICD-10-CM | POA: Diagnosis not present

## 2015-06-27 DIAGNOSIS — H26491 Other secondary cataract, right eye: Secondary | ICD-10-CM | POA: Diagnosis not present

## 2015-07-12 ENCOUNTER — Telehealth: Payer: Self-pay | Admitting: Internal Medicine

## 2015-07-12 DIAGNOSIS — E785 Hyperlipidemia, unspecified: Secondary | ICD-10-CM

## 2015-07-12 DIAGNOSIS — I4891 Unspecified atrial fibrillation: Secondary | ICD-10-CM

## 2015-07-12 DIAGNOSIS — I10 Essential (primary) hypertension: Secondary | ICD-10-CM

## 2015-07-12 NOTE — Telephone Encounter (Signed)
Okay to refer pt to [facility]?

## 2015-07-12 NOTE — Telephone Encounter (Signed)
Pt would like to know if you can refer her to a Cardiologist in [place] due to her Cardiologist is retiring that is in [place]?  Pls call before noon if you could.

## 2015-07-16 NOTE — Telephone Encounter (Signed)
Spoke to pt, told her order for referral to Cardiologist here in [place] was done and someone will be contacting you to schedule an appt. Pt verbalized understanding and said she has an appt at [facility] this week and she is going to see what they have to offer before she changes. Told her that is fine if you decide not to change just let them know when they call you. Pt verbalized understanding.

## 2015-07-16 NOTE — Telephone Encounter (Signed)
ok 

## 2015-07-18 DIAGNOSIS — I495 Sick sinus syndrome: Secondary | ICD-10-CM | POA: Diagnosis not present

## 2015-07-18 DIAGNOSIS — I480 Paroxysmal atrial fibrillation: Secondary | ICD-10-CM | POA: Diagnosis not present

## 2015-07-18 DIAGNOSIS — Z45018 Encounter for adjustment and management of other part of cardiac pacemaker: Secondary | ICD-10-CM | POA: Diagnosis not present

## 2015-07-18 DIAGNOSIS — I2510 Atherosclerotic heart disease of native coronary artery without angina pectoris: Secondary | ICD-10-CM | POA: Diagnosis not present

## 2015-07-18 DIAGNOSIS — I10 Essential (primary) hypertension: Secondary | ICD-10-CM | POA: Diagnosis not present

## 2015-07-18 DIAGNOSIS — Z6825 Body mass index (BMI) 25.0-25.9, adult: Secondary | ICD-10-CM | POA: Diagnosis not present

## 2015-07-18 DIAGNOSIS — E785 Hyperlipidemia, unspecified: Secondary | ICD-10-CM | POA: Diagnosis not present

## 2015-08-05 ENCOUNTER — Other Ambulatory Visit: Payer: Self-pay | Admitting: Internal Medicine

## 2015-09-04 ENCOUNTER — Ambulatory Visit (INDEPENDENT_AMBULATORY_CARE_PROVIDER_SITE_OTHER): Payer: Medicare Other | Admitting: Internal Medicine

## 2015-09-04 ENCOUNTER — Encounter: Payer: Self-pay | Admitting: Internal Medicine

## 2015-09-04 VITALS — BP 140/90 | HR 70 | Temp 98.0°F | Resp 20 | Ht 59.0 in | Wt 133.0 lb

## 2015-09-04 DIAGNOSIS — Z7901 Long term (current) use of anticoagulants: Secondary | ICD-10-CM | POA: Diagnosis not present

## 2015-09-04 DIAGNOSIS — I480 Paroxysmal atrial fibrillation: Secondary | ICD-10-CM | POA: Diagnosis not present

## 2015-09-04 DIAGNOSIS — E785 Hyperlipidemia, unspecified: Secondary | ICD-10-CM

## 2015-09-04 DIAGNOSIS — E039 Hypothyroidism, unspecified: Secondary | ICD-10-CM

## 2015-09-04 DIAGNOSIS — I10 Essential (primary) hypertension: Secondary | ICD-10-CM | POA: Diagnosis not present

## 2015-09-04 LAB — CBC WITH DIFFERENTIAL/PLATELET
BASOS PCT: 0.3 % (ref 0.0–3.0)
Basophils Absolute: 0 10*3/uL (ref 0.0–0.1)
Eosinophils Absolute: 0 10*3/uL (ref 0.0–0.7)
Eosinophils Relative: 0.8 % (ref 0.0–5.0)
HEMATOCRIT: 38.7 % (ref 36.0–46.0)
Hemoglobin: 12.7 g/dL (ref 12.0–15.0)
LYMPHS PCT: 27.8 % (ref 12.0–46.0)
Lymphs Abs: 1.2 10*3/uL (ref 0.7–4.0)
MCHC: 32.8 g/dL (ref 30.0–36.0)
MCV: 95.9 fl (ref 78.0–100.0)
Monocytes Absolute: 0.5 10*3/uL (ref 0.1–1.0)
Monocytes Relative: 12.8 % — ABNORMAL HIGH (ref 3.0–12.0)
NEUTROS ABS: 2.4 10*3/uL (ref 1.4–7.7)
Neutrophils Relative %: 58.3 % (ref 43.0–77.0)
PLATELETS: 108 10*3/uL — AB (ref 150.0–400.0)
RBC: 4.04 Mil/uL (ref 3.87–5.11)
RDW: 14.9 % (ref 11.5–15.5)
WBC: 4.2 10*3/uL (ref 4.0–10.5)

## 2015-09-04 LAB — COMPREHENSIVE METABOLIC PANEL
ALT: 37 U/L — ABNORMAL HIGH (ref 0–35)
AST: 33 U/L (ref 0–37)
Albumin: 4.4 g/dL (ref 3.5–5.2)
Alkaline Phosphatase: 110 U/L (ref 39–117)
BUN: 24 mg/dL — ABNORMAL HIGH (ref 6–23)
CHLORIDE: 104 meq/L (ref 96–112)
CO2: 32 meq/L (ref 19–32)
Calcium: 11 mg/dL — ABNORMAL HIGH (ref 8.4–10.5)
Creatinine, Ser: 0.97 mg/dL (ref 0.40–1.20)
GFR: 57.59 mL/min — ABNORMAL LOW (ref >60.00)
Glucose, Bld: 90 mg/dL (ref 70–99)
Potassium: 4.9 mEq/L (ref 3.5–5.1)
Sodium: 142 mEq/L (ref 135–145)
Total Bilirubin: 0.4 mg/dL (ref 0.2–1.2)
Total Protein: 7.3 g/dL (ref 6.0–8.3)

## 2015-09-04 LAB — LIPID PANEL
CHOL/HDL RATIO: 2
Cholesterol: 160 mg/dL (ref 0–200)
HDL: 65.3 mg/dL (ref >39.00)
LDL CALC: 77 mg/dL (ref 0–99)
NonHDL: 94.77
Triglycerides: 91 mg/dL (ref 0.0–149.0)
VLDL: 18.2 mg/dL (ref 0.0–40.0)

## 2015-09-04 LAB — TSH: TSH: 3.19 u[IU]/mL (ref 0.35–4.50)

## 2015-09-04 MED ORDER — ZOLPIDEM TARTRATE 5 MG PO TABS: 5.0000 mg | ORAL_TABLET | Freq: Every day | ORAL | Status: DC

## 2015-09-04 NOTE — Patient Instructions (Signed)
Limit your sodium (Salt) intake  Please check your blood pressure on a regular basis.  If it is consistently greater than 150/90, please make an office appointment.  Take a calcium supplement, plus 800-1200 units of vitamin D  Return in 6 months for follow-up  

## 2015-09-04 NOTE — Progress Notes (Signed)
Subjective:    Patient ID: Christine Perez, female    DOB: 12-22-1925, 82 y.o.   MRN: [id number]  HPI      Subjective:    Patient ID: Christine Perez, female    DOB: March 27, 1925, 82 y.o.   MRN: [id number]  HPI   HPI  52 -year-old patient who is seen today for an annual exam.   Medical problems include hypothyroidism as well as dyslipidemia. She has treated hypertension. She is doing quite well. She does have a history of colonic polyps and underwent a colonoscopy approximately 6  years ago.  Approximately two years ago, at the time of her last physical, she was diagnosed with new onset atrial fibrillation.  Remains on chronic anticoagulation and rate control.  She is status post permanent pacemaker in September 2015 apparently for sick sinus syndrome.  She is now followed by cardiology in [facility]  Laboratory studies reviewed;  recent breast biopsy March 2015 benign  Doing well today.  Her only complaint is unsteadiness of gait.  She states that she tends to fall to the right   Alcohol-Tobacco  Smoking Status: never   Allergies:  1) ! Codeine   Past History:  Past Medical History:   Hypertension  Hypercholesterolemia  Colonic polyps, hx of  Hyperlipidemia  Hypothyroidism  Osteoporosis  Atrial fibrillation/ tachycardia bradycardia syndrome Chronic anticoagulation History of left breast carcinoma  Past Surgical History:  Appendectomy  Cholecystectomy  Hysterectomy  Lumpectomy-left  Tonsillectomy  cataracts 5-10  Status post left partial mastectomy April 2014 for lobular carcinoma in situ Status post pacemaker September 2015 for tachybradycardia syndrome (Dr Elonda Husky [facility])  Family History:   Family History Other cancer-Colon, breast ; mother with colon cancer sister and a maternal cousin with breast cancer ; maternal aunt with ovarian cancer Fam hx MI   Social History:   Married  Alcohol use-no  NonsmokerSmoking Status: never   Medicare Wellness  1.  Risk factors, based on past M,S,F history-cardiovascular risk factors include hypertension dyslipidemia  2. Physical activities: Fairly sedentary but no restrictions  3. Depression/mood: No history depression or mood disorder  4. Hearing: Moderate deficits-uses hearing aids 5. ADL's: Independent in all aspects of daily living  6. Fall risk:  moderate due to  mild obesity and age.  Complains some mild balance issues 7. Home safety: No problems identified. Lives with her daughter  23. Height weight, and visual acuity; height and weight stable does have a history of osteoporosis and kyphosis. Recently passed her driver's license exam with no restrictions with glasses;  she has a cataract extraction surgery in the past ; has had recent laser surgery with Dr. Carolynn Sayers 9. Counseling: Heart healthy diet modest weight loss and more regular exercise all encouraged  10. Lab orders based on risk factors: Laboratory profile including TSH and lipid panel will be reviewed  11. Referral: Not appropriate at this time. Has had a recent mammogram  12. Care plan: Restricted salt diet exercise weight loss encouraged  13. Cognitive assessment: Alert and appropriate with normal affect. No cognitive dysfunction  14.  Preventive services will include annual breast mammograms due to her history of left breast carcinoma.  She will be seen by cardiology and primary care medicine.  Annual eye examinations.  Encouraged 15.  Provider list update includes primary care cardiology and ophthalmology   Past Medical History  Diagnosis Date  . CARDIAC ARRHYTHMIA 01/08/2010  . COLONIC POLYPS, HX OF 10/09/2006  . HYPERLIPIDEMIA 10/09/2006  .  HYPERTENSION 10/09/2006  . HYPOTHYROIDISM 10/09/2006  . LASSITUDE 08/15/2009  . OSTEOPOROSIS 10/09/2006  . VERTIGO 08/07/2009  . Wears glasses   . Wears hearing aid     left  . Atrial fibrillation ([place])   . COPD (chronic obstructive pulmonary disease) ([place])     Social History   Social History    . Marital Status: Widowed    Spouse Name: N/A  . Number of Children: N/A  . Years of Education: N/A   Occupational History  . Not on file.   Social History Main Topics  . Smoking status: Never Smoker   . Smokeless tobacco: Never Used  . Alcohol Use: No  . Drug Use: No  . Sexual Activity: Not on file   Other Topics Concern  . Not on file   Social History Narrative    Past Surgical History  Procedure Laterality Date  . Appendectomy    . Cholecystectomy    . Abdominal hysterectomy    . Breast surgery      left - lumpectomy  . Tonsillectomy    . Cataract extraction    . Partial mastectomy with needle localization Left 06/29/2012    Procedure: PARTIAL MASTECTOMY WITH NEEDLE LOCALIZATION;  Surgeon: Adin Hector, MD;  Location: [place];  Service: General;  Laterality: Left;  . Cardioversion N/A 08/09/2013    Procedure: CARDIOVERSION;  Surgeon: Candee Furbish, MD;  Location: [facility] ENDOSCOPY;  Service: Cardiovascular;  Laterality: N/A;  . Pacemaker placement N/A 12/05/2013    No family history on file.  Allergies  Allergen Reactions  . Codeine Shortness Of Breath    Difficulty breathing    Current Outpatient Prescriptions on File Prior to Visit  Medication Sig Dispense Refill  . amiodarone (PACERONE) 400 MG tablet Take 200 mg by mouth daily.   2  . amoxicillin-clavulanate (AUGMENTIN) 875-125 MG tablet Take 1 tablet by mouth 2 (two) times daily. 14 tablet 0  . calcium-vitamin D (OSCAL WITH D) 500-200 MG-UNIT per tablet Take 1 tablet by mouth 2 (two) times daily.     Marland Kitchen ipratropium (ATROVENT HFA) 17 MCG/ACT inhaler Inhale 2 puffs into the lungs every 4 (four) hours as needed for wheezing. 1 Inhaler 12  . losartan (COZAAR) 100 MG tablet TAKE ONE TABLET BY MOUTH ONCE DAILY 90 tablet 0  . meclizine (ANTIVERT) 25 MG tablet TAKE ONE TABLET BY MOUTH EVERY 6 HOURS AS NEEDED FOR DIZZINESS 50 tablet 0  . metoprolol succinate (TOPROL-XL) 50 MG 24 hr tablet Take 25 mg by  mouth at bedtime.   1  . Multiple Vitamin (MULTIVITAMIN) tablet Take 1 tablet by mouth daily.    Marland Kitchen SYNTHROID 75 MCG tablet TAKE ONE TABLET BY MOUTH ONCE DAILY 90 tablet 1  . traMADol (ULTRAM) 50 MG tablet Take 50 mg by mouth every 6 (six) hours as needed (pain).    . vitamin C (ASCORBIC ACID) 500 MG tablet Take 500 mg by mouth daily.    Alveda Reasons 20 MG TABS tablet TAKE ONE TABLET BY MOUTH ONCE DAILY WITH  SUPPER 30 tablet 5  . zolpidem (AMBIEN) 5 MG tablet TAKE ONE TABLET BY MOUTH AT BEDTIME AS NEEDED FOR SLEEP 30 tablet 3  . [DISCONTINUED] metoprolol tartrate (LOPRESSOR) 25 MG tablet Take 1 tablet (25 mg total) by mouth 2 (two) times daily. 60 tablet 5   No current facility-administered medications on file prior to visit.    There were no vitals taken for this visit.  Review of Systems  Constitutional: Negative for fever, appetite change, fatigue and unexpected weight change.  HENT: Positive for hearing loss. Negative for congestion, dental problem, ear pain, mouth sores, nosebleeds, sinus pressure, sore throat, tinnitus, trouble swallowing and voice change.   Eyes: Negative for photophobia, pain, redness and visual disturbance.  Respiratory: Negative for cough, chest tightness and shortness of breath.   Cardiovascular: Negative for chest pain, palpitations and leg swelling.  Gastrointestinal: Negative for nausea, vomiting, abdominal pain, diarrhea, constipation, blood in stool, abdominal distention and rectal pain.  Genitourinary: Negative for dysuria, urgency, frequency, hematuria, flank pain, vaginal bleeding, vaginal discharge, difficulty urinating, genital sores, vaginal pain, menstrual problem and pelvic pain.  Musculoskeletal: Negative for arthralgias, back pain and neck stiffness.  Skin: Negative for rash.  Neurological: Negative for dizziness, syncope, speech difficulty, weakness, light-headedness, numbness and headaches.  Hematological: Negative for adenopathy. Does not  bruise/bleed easily.  Psychiatric/Behavioral: Negative for suicidal ideas, behavioral problems, self-injury, dysphoric mood and agitation. The patient is not nervous/anxious.        Objective:   Physical Exam  Constitutional: She is oriented to person, place, and time. She appears well-developed and well-nourished.  HENT:  Head: Normocephalic and atraumatic.  Right Ear: External ear normal.  Left Ear: External ear normal.  Mouth/Throat: Oropharynx is clear and moist.  Hearing aid present on the left  Low hanging soft palate  Eyes: Conjunctivae and EOM are normal.  Neck: Normal range of motion. Neck supple. No JVD present. No thyromegaly present.  Cardiovascular: Normal rate, normal heart sounds and intact distal pulses.   No murmur heard.  pulse regular with rate of 70 Pulmonary/Chest: Effort normal and breath sounds normal. She has no wheezes. She has no rales.  Kyphosis  Abdominal: Soft. Bowel sounds are normal. She exhibits no distension and no mass. There is no tenderness. There is no rebound and no guarding.  Abdominal scar mildly obese no masses  Musculoskeletal: Normal range of motion. She exhibits no edema and no tenderness.  Neurological: She is alert and oriented to person, place, and time. She has normal reflexes. No cranial nerve deficit. She exhibits normal muscle tone. Coordination normal.  Skin: Skin is warm and dry. No rash noted.  Psychiatric: She has a normal mood and affect. Her behavior is normal.    5 mm slightly raised papule right outer forearm. This was treated with cryotherapy without difficulty      Assessment & Plan:   Preventive health exam Hypothyroidism. We'll check a TSH dyslipidemia we'll check a lipid profile Hypertension stable Sick Sinus syndrome.  Status post permanent pacemaker Chronic anticoagulation  Status post  partial mastectomy for lobular carcinoma in situ.  Status post breast biopsy March 2015      Review of Systems   As  above.  Only complaint is some mild balance issues    Objective:   Physical Exam  Cardiovascular:  Pacemaker left upper anterior chest wall Dorsalis pedis pulses plus 1.  Posterior tibial pulses plus 3  Neurological:  No drift Normal finger-to-nose testing Date of bit unsteady but no gross ataxia          Assessment & Plan:   Preventive health examination As above No change in medical regimen Medications updated  We'll check updated lab Follow-up cardiology  Recheck here 6 months  Nyoka Cowden, MD

## 2015-09-04 NOTE — Progress Notes (Signed)
Pre visit review using our clinic review tool, if applicable. No additional management support is needed unless otherwise documented below in the visit note. 

## 2015-09-10 ENCOUNTER — Other Ambulatory Visit: Payer: Self-pay | Admitting: Internal Medicine

## 2015-09-24 ENCOUNTER — Telehealth: Payer: Self-pay | Admitting: Internal Medicine

## 2015-09-24 NOTE — Telephone Encounter (Signed)
Pt would like a call back concerning changing her cholesterol med.  [facility]/ [address]

## 2015-09-25 NOTE — Telephone Encounter (Signed)
Discontinue pravastatin

## 2015-09-25 NOTE — Telephone Encounter (Signed)
Dr. Raliegh Ip please see message and advise.

## 2015-09-25 NOTE — Telephone Encounter (Signed)
Spoke to pt, told her Dr.K said to stop Pravastatin you do not need it. Pt verbalized understanding.

## 2015-09-25 NOTE — Telephone Encounter (Signed)
Spoke to pt, c/o feeling tired all the time and pain right side since taking Pravastatin,would like something else to take. Told pt will send message to Dr. Raliegh Ip and get back to her. Pt verbalized understanding.

## 2015-11-28 DIAGNOSIS — H26491 Other secondary cataract, right eye: Secondary | ICD-10-CM | POA: Diagnosis not present

## 2015-11-28 DIAGNOSIS — H348122 Central retinal vein occlusion, left eye, stable: Secondary | ICD-10-CM | POA: Diagnosis not present

## 2015-11-28 DIAGNOSIS — Z961 Presence of intraocular lens: Secondary | ICD-10-CM | POA: Diagnosis not present

## 2015-11-28 DIAGNOSIS — H5310 Unspecified subjective visual disturbances: Secondary | ICD-10-CM | POA: Diagnosis not present

## 2015-11-28 DIAGNOSIS — H35352 Cystoid macular degeneration, left eye: Secondary | ICD-10-CM | POA: Diagnosis not present

## 2015-11-29 DIAGNOSIS — H34812 Central retinal vein occlusion, left eye, with macular edema: Secondary | ICD-10-CM | POA: Diagnosis not present

## 2015-11-29 DIAGNOSIS — Z961 Presence of intraocular lens: Secondary | ICD-10-CM | POA: Diagnosis not present

## 2015-11-29 DIAGNOSIS — H35352 Cystoid macular degeneration, left eye: Secondary | ICD-10-CM | POA: Diagnosis not present

## 2015-12-17 DIAGNOSIS — I10 Essential (primary) hypertension: Secondary | ICD-10-CM | POA: Diagnosis not present

## 2015-12-17 DIAGNOSIS — I2510 Atherosclerotic heart disease of native coronary artery without angina pectoris: Secondary | ICD-10-CM | POA: Diagnosis not present

## 2015-12-17 DIAGNOSIS — I495 Sick sinus syndrome: Secondary | ICD-10-CM | POA: Diagnosis not present

## 2015-12-17 DIAGNOSIS — Z6825 Body mass index (BMI) 25.0-25.9, adult: Secondary | ICD-10-CM | POA: Diagnosis not present

## 2015-12-17 DIAGNOSIS — I480 Paroxysmal atrial fibrillation: Secondary | ICD-10-CM | POA: Diagnosis not present

## 2015-12-17 DIAGNOSIS — R0789 Other chest pain: Secondary | ICD-10-CM | POA: Diagnosis not present

## 2015-12-17 DIAGNOSIS — Z45018 Encounter for adjustment and management of other part of cardiac pacemaker: Secondary | ICD-10-CM | POA: Diagnosis not present

## 2015-12-18 ENCOUNTER — Other Ambulatory Visit: Payer: Self-pay | Admitting: Internal Medicine

## 2015-12-24 DIAGNOSIS — H34812 Central retinal vein occlusion, left eye, with macular edema: Secondary | ICD-10-CM | POA: Diagnosis not present

## 2015-12-24 DIAGNOSIS — H35352 Cystoid macular degeneration, left eye: Secondary | ICD-10-CM | POA: Diagnosis not present

## 2015-12-24 DIAGNOSIS — S8002XA Contusion of left knee, initial encounter: Secondary | ICD-10-CM | POA: Diagnosis not present

## 2015-12-24 DIAGNOSIS — Z961 Presence of intraocular lens: Secondary | ICD-10-CM | POA: Diagnosis not present

## 2015-12-24 DIAGNOSIS — M25562 Pain in left knee: Secondary | ICD-10-CM | POA: Diagnosis not present

## 2016-01-03 DIAGNOSIS — H34812 Central retinal vein occlusion, left eye, with macular edema: Secondary | ICD-10-CM | POA: Diagnosis not present

## 2016-01-08 ENCOUNTER — Ambulatory Visit (INDEPENDENT_AMBULATORY_CARE_PROVIDER_SITE_OTHER)
Admission: RE | Admit: 2016-01-08 | Discharge: 2016-01-08 | Disposition: A | Discharge status: Home / Self Care | Payer: Medicare Other | Source: Ambulatory Visit | Attending: Family Medicine | Admitting: Family Medicine

## 2016-01-08 ENCOUNTER — Ambulatory Visit (INDEPENDENT_AMBULATORY_CARE_PROVIDER_SITE_OTHER): Payer: Medicare Other | Admitting: Family Medicine

## 2016-01-08 ENCOUNTER — Encounter: Payer: Self-pay | Admitting: Family Medicine

## 2016-01-08 ENCOUNTER — Telehealth: Payer: Self-pay | Admitting: Internal Medicine

## 2016-01-08 VITALS — BP 118/72 | HR 85 | Temp 97.7°F | Ht 59.0 in | Wt 128.7 lb

## 2016-01-08 DIAGNOSIS — W19XXXA Unspecified fall, initial encounter: Secondary | ICD-10-CM

## 2016-01-08 DIAGNOSIS — Z23 Encounter for immunization: Secondary | ICD-10-CM

## 2016-01-08 DIAGNOSIS — S79911A Unspecified injury of right hip, initial encounter: Secondary | ICD-10-CM | POA: Diagnosis not present

## 2016-01-08 DIAGNOSIS — M25551 Pain in right hip: Secondary | ICD-10-CM

## 2016-01-08 NOTE — Progress Notes (Signed)
Pre visit review using our clinic review tool, if applicable. No additional management support is needed unless otherwise documented below in the visit note. 

## 2016-01-08 NOTE — Telephone Encounter (Signed)
[facility] - Client [place]  Patient Name: Christine Perez  DOB: [birth year]    Initial Comment Caller states 66 yr mother fell Sunday, thought she was bruised and would be okay; but having difficulty walking; having sharp knife pain in lower back hip area; she is jerking with it; RT side;    Nurse Assessment  Nurse: Wayne Sever, RN, Tillie Rung Date/Time (Eastern Time): 01/08/2016 9:48:24 AM  Confirm and document reason for call. If symptomatic, describe symptoms. You must click the next button to save text entered. ---Caller states her mother fell on Sunday and is having pain in the right hip. Daughter states she is having trouble walking due to the pain in hip and back.  Has the patient traveled out of the country within the last 30 days? ---Not Applicable  Does the patient have any new or worsening symptoms? ---Yes  Will a triage be completed? ---Yes  Related visit to physician within the last 2 weeks? ---No  Does the PT have any chronic conditions? (i.e. diabetes, asthma, etc.) ---Yes  List chronic conditions. ---Pacemaker, HTN, Hypothyroidism  Is this a behavioral health or substance abuse call? ---No     Guidelines    Guideline Title Affirmed Question Affirmed Notes  Hip Injury [1] High-risk adult (e.g., age > 34, osteoporosis, chronic steroid use) AND [2] limping    Final Disposition User   See Physician within 24 Hours [place], RN, Tillie Rung    Comments  Scheduled with Dr Maudie Mercury at 2pm today   Referrals  REFERRED TO PCP OFFICE   Disagree/Comply: Comply

## 2016-01-08 NOTE — Telephone Encounter (Signed)
Pt has appt today @ 2pm with DrKim. Nothing further needed.

## 2016-01-08 NOTE — Patient Instructions (Signed)
BEFORE YOU LEAVE: -flu shot -xray sheet -follow up: 3-4 weeks   Ice or heat and tylenol as needed for pain.  Xray to evaluate.  Follow up sooner if worsening or other concerns

## 2016-01-08 NOTE — Progress Notes (Signed)
HPI:  Christine Perez is a sweet 82 year old here for an acute visit for right hip pain. She reports she slipped and fell against the wall in the bathtub about 3 days ago. She was immediately able to bear weight, but has had some pain in the right lateral buttock and hip region since. The pain is mild at rest and mild while walking, but is worse and rather severe and sharp when she goes to get up from the chair. She denies radiation of pain, weakness, numbness, changes in bowel or bladder function, malaise or fevers. She does take xarelto so avoids nsaids per daughter. She does not like to take tramadol.  ROS: See pertinent positives and negatives per HPI.  Past Medical History:  Diagnosis Date  . Atrial fibrillation ([place])   . CARDIAC ARRHYTHMIA 01/08/2010  . COLONIC POLYPS, HX OF 10/09/2006  . COPD (chronic obstructive pulmonary disease) ([place])   . HYPERLIPIDEMIA 10/09/2006  . HYPERTENSION 10/09/2006  . HYPOTHYROIDISM 10/09/2006  . LASSITUDE 08/15/2009  . OSTEOPOROSIS 10/09/2006  . VERTIGO 08/07/2009  . Wears glasses   . Wears hearing aid    left    Past Surgical History:  Procedure Laterality Date  . ABDOMINAL HYSTERECTOMY    . APPENDECTOMY    . BREAST SURGERY     left - lumpectomy  . CARDIOVERSION N/A 08/09/2013   Procedure: CARDIOVERSION;  Surgeon: Candee Furbish, MD;  Location: [facility] ENDOSCOPY;  Service: Cardiovascular;  Laterality: N/A;  . CATARACT EXTRACTION    . CHOLECYSTECTOMY    . PACEMAKER PLACEMENT N/A 12/05/2013  . PARTIAL MASTECTOMY WITH NEEDLE LOCALIZATION Left 06/29/2012   Procedure: PARTIAL MASTECTOMY WITH NEEDLE LOCALIZATION;  Surgeon: Adin Hector, MD;  Location: [facility];  Service: General;  Laterality: Left;  . TONSILLECTOMY      No family history on file.  Social History   Social History  . Marital status: Widowed    Spouse name: N/A  . Number of children: N/A  . Years of education: N/A   Social History Main Topics  . Smoking status: Never  Smoker  . Smokeless tobacco: Never Used  . Alcohol use No  . Drug use: No  . Sexual activity: Not Asked   Other Topics Concern  . None   Social History Narrative  . None     Current Outpatient Prescriptions:  .  amiodarone (PACERONE) 400 MG tablet, Take 200 mg by mouth daily. , Disp: , Rfl: 2 .  calcium-vitamin D (OSCAL WITH D) 500-200 MG-UNIT per tablet, Take 1 tablet by mouth 2 (two) times daily. , Disp: , Rfl:  .  ipratropium (ATROVENT HFA) 17 MCG/ACT inhaler, Inhale 2 puffs into the lungs every 4 (four) hours as needed for wheezing., Disp: 1 Inhaler, Rfl: 12 .  losartan (COZAAR) 100 MG tablet, TAKE ONE TABLET BY MOUTH ONCE DAILY, Disp: 90 tablet, Rfl: 1 .  Multiple Vitamin (MULTIVITAMIN) tablet, Take 1 tablet by mouth daily., Disp: , Rfl:  .  pravastatin (PRAVACHOL) 20 MG tablet, Take 20 mg by mouth., Disp: , Rfl:  .  SYNTHROID 75 MCG tablet, TAKE ONE TABLET BY MOUTH ONCE DAILY, Disp: 90 tablet, Rfl: 1 .  traMADol (ULTRAM) 50 MG tablet, Take 50 mg by mouth every 6 (six) hours as needed (pain)., Disp: , Rfl:  .  vitamin C (ASCORBIC ACID) 500 MG tablet, Take 500 mg by mouth daily., Disp: , Rfl:  .  XARELTO 20 MG TABS tablet, TAKE ONE TABLET BY MOUTH ONCE DAILY  WITH  SUPPER, Disp: 30 tablet, Rfl: 5 .  zolpidem (AMBIEN) 5 MG tablet, Take 1 tablet (5 mg total) by mouth at bedtime., Disp: 30 tablet, Rfl: 5  EXAM:  Vitals:   01/08/16 1409  BP: 118/72  Pulse: 85  Temp: 97.7 F (36.5 C)    Body mass index is 25.99 kg/m.  GENERAL: vitals reviewed and listed above, alert, oriented, appears well hydrated and in no acute distress  HEENT: atraumatic, conjunttiva clear, no obvious abnormalities on inspection of external nose and ears  NECK: no obvious masses on inspection  MS: moves all extremities without noticeable abnormality On inspection of the low back, but AND right hip, there are no signs of skin rash, deformity, evidence of trauma, bruising or swelling. She is tender in  the right lateral upper buttock region and along the right iliac crest. No tenderness over the right hip joint or the greater trochanter. I did watch her ambulating back and forth in the exam room, and she does not seem to have worsening pain with weightbearing and is able to ambulate without a limp. She does seem to have more pain with getting up and down from the chair. Normal range of motion of the hip, normal strength and sensitivity to light touch in the lower extremities bilaterally.  PSYCH: pleasant and cooperative, no obvious depression or anxiety  ASSESSMENT AND PLAN:  Discussed the following assessment and plan:  Right hip pain - Plan: DG HIP UNILAT W OR W/O PELVIS 2-3 VIEWS RIGHT  Fall, initial encounter - Plan: DG HIP UNILAT W OR W/O PELVIS 2-3 VIEWS RIGHT  -we discussed possible serious and likely etiologies, workup and treatment, treatment risks and return precautions - most likely soft tissue contusion, the daughter is present and is concerned about a possible fracture so we will get an x-ray to evaluate -Options for symptomatic care discussed, she is limited in what she can and wishes to take for pain -Fall precautions advised, she is using a cane for ambulation -Follow up with PCP in 3-4 weeks to ensure healing properly -of course, we advised [place]  to return or notify a doctor immediately if symptoms worsen or persist or new concerns arise.   Patient Instructions  BEFORE YOU LEAVE: -flu shot -xray sheet -follow up: 3-4 weeks   Ice or heat and tylenol as needed for pain.  Xray to evaluate.  Follow up sooner if worsening or other concerns    Colin Benton R., DO

## 2016-01-09 ENCOUNTER — Telehealth: Payer: Self-pay | Admitting: Internal Medicine

## 2016-01-09 NOTE — Telephone Encounter (Signed)
° °  Pt was in on yesterday for a fall and she is having muscle pain. Her daughter call to ask if a muscle relaxer can be called in.  Pt saw Dr Maudie Mercury   [place] pt daughter     Engineer, civil (consulting) in [place]

## 2016-01-10 NOTE — Telephone Encounter (Signed)
See other note. Could try a muscle relaxer - but not sur eif this would help. Tizanidine 2mg  q 12 hours prn spasm. #20. No refills.

## 2016-01-10 NOTE — Telephone Encounter (Signed)
Dr. Maudie Mercury, can you address this message. Dr. Raliegh Ip is out of the office. Thanks

## 2016-01-10 NOTE — Telephone Encounter (Signed)
Pt daughter is calling back and is aware dr Raliegh Ip is not in office today

## 2016-01-11 MED ORDER — TIZANIDINE HCL 2 MG PO TABS: 2.0000 mg | ORAL_TABLET | Freq: Two times a day (BID) | ORAL | 0 refills | Status: DC | PRN

## 2016-01-11 NOTE — Telephone Encounter (Signed)
Spoke to pt's daughter Juliann Pulse, told her Dr.Kim is ordering Tizanidine 2 mg every 12 hours as needed for muscle spasms. Rx sent to pharmacy. Juliann Pulse verbalized understanding.

## 2016-01-11 NOTE — Addendum Note (Signed)
Addended by: Marian Sorrow on: 01/11/2016 11:20 AM   Modules accepted: Orders

## 2016-01-22 ENCOUNTER — Other Ambulatory Visit: Payer: Self-pay | Admitting: Family Medicine

## 2016-01-29 ENCOUNTER — Telehealth: Payer: Self-pay | Admitting: Internal Medicine

## 2016-01-29 NOTE — Telephone Encounter (Signed)
Vicodin 5-325  #40 One every 8 hours as needed for pain. Follow-up office visit.  If unimproved

## 2016-01-29 NOTE — Telephone Encounter (Signed)
Spoke to pt's daughter [name], told her unfortunately Dr.K can not order anything stronger due to pt allergic to codeine. Told her Dr.K said to take Tramadol 2 tablets at a time and see if that helps, if not needs to be seen next week. [name] verbalized understanding and asked to schedule appt for next Tuesday. Told her okay come in Tues 11/21 at 4:00 pm to see Dr.K. [name] verbalized understanding and appt scheduled by [facility] [place].

## 2016-01-29 NOTE — Telephone Encounter (Signed)
Spoke to pt's daughter, pt still c/o back pain and the Tramadol is not helping would like something else for pain. Please advise.

## 2016-01-29 NOTE — Telephone Encounter (Signed)
Discussed order for Hydrocodone with Dr.K due to pt is allergic to Codeine. Dr.K said do not order Hydrocodone, just have pt take Tramadol 50 mg 2 tablets every 6 hours as needed and follow up with him next week if no improvement.

## 2016-01-29 NOTE — Telephone Encounter (Signed)
Daughter would like you to call her concerning pt. Pt still having pain and she wants to discuss with you.

## 2016-01-30 MED ORDER — TRAMADOL HCL 50 MG PO TABS: 100.0000 mg | ORAL_TABLET | Freq: Four times a day (QID) | ORAL | 0 refills | Status: DC | PRN

## 2016-01-30 NOTE — Telephone Encounter (Signed)
Christine Perez pt's daughter notified Rx called into pharmacy. Christine Perez verbalized understanding.

## 2016-01-30 NOTE — Telephone Encounter (Signed)
Pt needs a refill on tramadol

## 2016-01-30 NOTE — Addendum Note (Signed)
Addended by: Marian Sorrow on: 01/30/2016 01:06 PM   Modules accepted: Orders

## 2016-02-04 ENCOUNTER — Other Ambulatory Visit: Payer: Self-pay | Admitting: Internal Medicine

## 2016-02-05 ENCOUNTER — Ambulatory Visit: Payer: Medicare Other | Admitting: Internal Medicine

## 2016-02-08 DIAGNOSIS — S99921A Unspecified injury of right foot, initial encounter: Secondary | ICD-10-CM | POA: Diagnosis not present

## 2016-02-12 ENCOUNTER — Ambulatory Visit (INDEPENDENT_AMBULATORY_CARE_PROVIDER_SITE_OTHER): Payer: Medicare Other | Admitting: Internal Medicine

## 2016-02-12 ENCOUNTER — Encounter: Payer: Self-pay | Admitting: Internal Medicine

## 2016-02-12 VITALS — BP 140/90 | HR 75 | Temp 98.3°F | Resp 20 | Ht 59.0 in | Wt 125.5 lb

## 2016-02-12 DIAGNOSIS — I481 Persistent atrial fibrillation: Secondary | ICD-10-CM

## 2016-02-12 DIAGNOSIS — I10 Essential (primary) hypertension: Secondary | ICD-10-CM | POA: Diagnosis not present

## 2016-02-12 DIAGNOSIS — B9789 Other viral agents as the cause of diseases classified elsewhere: Secondary | ICD-10-CM

## 2016-02-12 DIAGNOSIS — J069 Acute upper respiratory infection, unspecified: Secondary | ICD-10-CM | POA: Diagnosis not present

## 2016-02-12 DIAGNOSIS — E038 Other specified hypothyroidism: Secondary | ICD-10-CM

## 2016-02-12 DIAGNOSIS — I4819 Other persistent atrial fibrillation: Secondary | ICD-10-CM

## 2016-02-12 NOTE — Patient Instructions (Signed)
Acute bronchitis symptoms for less than 10 days are generally not helped by antibiotics.  Take over-the-counter expectorants and cough medications such as  Mucinex DM.  Call if there is no improvement in 5 to 7 days or if  you develop worsening cough, fever, or new symptoms, such as shortness of breath or chest pain.   

## 2016-02-12 NOTE — Progress Notes (Signed)
Pre visit review using our clinic review tool, if applicable. No additional management support is needed unless otherwise documented below in the visit note. 

## 2016-02-12 NOTE — Progress Notes (Signed)
Subjective:    Patient ID: [name], female    DOB: 1925/10/02, 82 y.o.   MRN: [id number]  HPI 82 year old patient who has a history of atrial fibrillation and remains on chronic anticoagulation.  She has essential hypertension. The patient has experienced multiple falls.  She does have both a cane and a walker. She fell Thanksgiving day, sustaining trauma to her right lateral ankle and right ear.  She has also had some worsening low back pain since this last fall. She has had some mild cough and congestion that seems to be improving.  No fever.  She does describe some nocturnal wheezing  Past Medical History:  Diagnosis Date  . Atrial fibrillation ([place])   . CARDIAC ARRHYTHMIA 01/08/2010  . COLONIC POLYPS, HX OF 10/09/2006  . COPD (chronic obstructive pulmonary disease) ([place])   . HYPERLIPIDEMIA 10/09/2006  . HYPERTENSION 10/09/2006  . HYPOTHYROIDISM 10/09/2006  . LASSITUDE 08/15/2009  . OSTEOPOROSIS 10/09/2006  . VERTIGO 08/07/2009  . Wears glasses   . Wears hearing aid    left     Social History   Social History  . Marital status: Widowed    Spouse name: N/A  . Number of children: N/A  . Years of education: N/A   Occupational History  . Not on file.   Social History Main Topics  . Smoking status: Never Smoker  . Smokeless tobacco: Never Used  . Alcohol use No  . Drug use: No  . Sexual activity: Not on file   Other Topics Concern  . Not on file   Social History Narrative  . No narrative on file    Past Surgical History:  Procedure Laterality Date  . ABDOMINAL HYSTERECTOMY    . APPENDECTOMY    . BREAST SURGERY     left - lumpectomy  . CARDIOVERSION N/A 08/09/2013   Procedure: CARDIOVERSION;  Surgeon: Candee Furbish, MD;  Location: [facility] ENDOSCOPY;  Service: Cardiovascular;  Laterality: N/A;  . CATARACT EXTRACTION    . CHOLECYSTECTOMY    . PACEMAKER PLACEMENT N/A 12/05/2013  . PARTIAL MASTECTOMY WITH NEEDLE LOCALIZATION Left 06/29/2012   Procedure: PARTIAL  MASTECTOMY WITH NEEDLE LOCALIZATION;  Surgeon: Adin Hector, MD;  Location: [place];  Service: General;  Laterality: Left;  . TONSILLECTOMY      No family history on file.  Allergies  Allergen Reactions  . Codeine Shortness Of Breath    Difficulty breathing    Current Outpatient Prescriptions on File Prior to Visit  Medication Sig Dispense Refill  . amiodarone (PACERONE) 400 MG tablet Take 200 mg by mouth daily.   2  . calcium-vitamin D (OSCAL WITH D) 500-200 MG-UNIT per tablet Take 1 tablet by mouth 2 (two) times daily.     Marland Kitchen ipratropium (ATROVENT HFA) 17 MCG/ACT inhaler Inhale 2 puffs into the lungs every 4 (four) hours as needed for wheezing. 1 Inhaler 12  . losartan (COZAAR) 100 MG tablet TAKE ONE TABLET BY MOUTH ONCE DAILY 90 tablet 1  . Multiple Vitamin (MULTIVITAMIN) tablet Take 1 tablet by mouth daily.    . pravastatin (PRAVACHOL) 20 MG tablet Take 20 mg by mouth.    . SYNTHROID 75 MCG tablet TAKE ONE TABLET BY MOUTH ONCE DAILY 90 tablet 1  . tiZANidine (ZANAFLEX) 2 MG tablet TAKE ONE TABLET BY MOUTH EVERY 12 HOURS AS NEEDED FOR MUSCLE SPASM 20 tablet 0  . traMADol (ULTRAM) 50 MG tablet Take 2 tablets (100 mg total) by mouth every 6 (six)  hours as needed (pain). 60 tablet 0  . vitamin C (ASCORBIC ACID) 500 MG tablet Take 500 mg by mouth daily.    Alveda Reasons 20 MG TABS tablet TAKE ONE TABLET BY MOUTH ONCE DAILY WITH  SUPPER 30 tablet 5  . zolpidem (AMBIEN) 5 MG tablet Take 1 tablet (5 mg total) by mouth at bedtime. 30 tablet 5  . [DISCONTINUED] metoprolol tartrate (LOPRESSOR) 25 MG tablet Take 1 tablet (25 mg total) by mouth 2 (two) times daily. 60 tablet 5   No current facility-administered medications on file prior to visit.     BP 140/90 (BP Location: Left Arm, Patient Position: Sitting, Cuff Size: Normal)   Pulse 75   Temp 98.3 F (36.8 C) (Oral)   Resp 20   Ht 4\' 11"  (1.499 m)   Wt 125 lb 8 oz (56.9 kg)   SpO2 94%   BMI 25.35 kg/m       Review of Systems  Constitutional: Positive for fatigue.  HENT: Positive for congestion. Negative for dental problem, hearing loss, rhinorrhea, sinus pressure, sore throat and tinnitus.   Eyes: Negative for pain, discharge and visual disturbance.  Respiratory: Positive for cough. Negative for shortness of breath.   Cardiovascular: Negative for chest pain, palpitations and leg swelling.  Gastrointestinal: Negative for abdominal distention, abdominal pain, blood in stool, constipation, diarrhea, nausea and vomiting.  Genitourinary: Negative for difficulty urinating, dysuria, flank pain, frequency, hematuria, pelvic pain, urgency, vaginal bleeding, vaginal discharge and vaginal pain.  Musculoskeletal: Positive for back pain. Negative for arthralgias, gait problem and joint swelling.  Skin: Negative for rash.  Neurological: Negative for dizziness, syncope, speech difficulty, weakness, numbness and headaches.  Hematological: Negative for adenopathy.  Psychiatric/Behavioral: Negative for agitation, behavioral problems and dysphoric mood. The patient is not nervous/anxious.        Objective:   Physical Exam  Constitutional: She is oriented to person, place, and time. She appears well-developed and well-nourished.   AfebrileNo distress Pulse 75 Blood pressure 140/90  HENT:  Head: Normocephalic.  Right Ear: External ear normal.  Left Ear: External ear normal.  Mouth/Throat: Oropharynx is clear and moist.  Eyes: Conjunctivae and EOM are normal. Pupils are equal, round, and reactive to light.  Neck: Normal range of motion. Neck supple. No thyromegaly present.  Cardiovascular: Normal rate, regular rhythm, normal heart sounds and intact distal pulses.   Pulmonary/Chest: Effort normal and breath sounds normal. She has no wheezes.  Abdominal: Soft. Bowel sounds are normal. She exhibits no mass. There is no tenderness.  Musculoskeletal: Normal range of motion.  Soft tissue swelling right  lateral ankle  Lymphadenopathy:    She has no cervical adenopathy.  Neurological: She is alert and oriented to person, place, and time.  Skin: Skin is warm and dry. No rash noted.  Abrasion of right external ear  Psychiatric: She has a normal mood and affect. Her behavior is normal.          Assessment & Plan:   Gait instability with multiple falls URI.  Will treat symptomatically.  The patient will maximize use of bronchodilators Atrial fibrillation Essential hypertension.  No change in therapy  Nyoka Cowden

## 2016-02-19 ENCOUNTER — Ambulatory Visit: Payer: Medicare Other | Admitting: Internal Medicine

## 2016-02-19 DIAGNOSIS — H35352 Cystoid macular degeneration, left eye: Secondary | ICD-10-CM | POA: Diagnosis not present

## 2016-02-19 DIAGNOSIS — H34812 Central retinal vein occlusion, left eye, with macular edema: Secondary | ICD-10-CM | POA: Diagnosis not present

## 2016-02-26 DIAGNOSIS — I2510 Atherosclerotic heart disease of native coronary artery without angina pectoris: Secondary | ICD-10-CM | POA: Diagnosis not present

## 2016-02-26 DIAGNOSIS — I10 Essential (primary) hypertension: Secondary | ICD-10-CM | POA: Diagnosis not present

## 2016-02-26 DIAGNOSIS — I495 Sick sinus syndrome: Secondary | ICD-10-CM | POA: Diagnosis not present

## 2016-02-26 DIAGNOSIS — E785 Hyperlipidemia, unspecified: Secondary | ICD-10-CM | POA: Diagnosis not present

## 2016-02-26 DIAGNOSIS — Z45018 Encounter for adjustment and management of other part of cardiac pacemaker: Secondary | ICD-10-CM | POA: Diagnosis not present

## 2016-02-26 DIAGNOSIS — Z6826 Body mass index (BMI) 26.0-26.9, adult: Secondary | ICD-10-CM | POA: Diagnosis not present

## 2016-02-26 DIAGNOSIS — I480 Paroxysmal atrial fibrillation: Secondary | ICD-10-CM | POA: Diagnosis not present

## 2016-02-26 DIAGNOSIS — I252 Old myocardial infarction: Secondary | ICD-10-CM | POA: Diagnosis not present

## 2016-02-27 ENCOUNTER — Ambulatory Visit: Payer: Medicare Other | Admitting: Internal Medicine

## 2016-03-02 ENCOUNTER — Inpatient Hospital Stay (HOSPITAL_COMMUNITY)
Admission: EM | Admit: 2016-03-02 | Discharge: 2016-03-04 | DRG: 304 | Disposition: A | Discharge status: Home Health | Payer: Medicare Other | Attending: Internal Medicine | Admitting: Internal Medicine

## 2016-03-02 ENCOUNTER — Encounter (HOSPITAL_COMMUNITY): Payer: Self-pay | Admitting: Emergency Medicine

## 2016-03-02 ENCOUNTER — Emergency Department (HOSPITAL_COMMUNITY): Payer: Medicare Other

## 2016-03-02 DIAGNOSIS — E876 Hypokalemia: Secondary | ICD-10-CM | POA: Diagnosis present

## 2016-03-02 DIAGNOSIS — I252 Old myocardial infarction: Secondary | ICD-10-CM | POA: Diagnosis not present

## 2016-03-02 DIAGNOSIS — R296 Repeated falls: Secondary | ICD-10-CM | POA: Diagnosis not present

## 2016-03-02 DIAGNOSIS — E039 Hypothyroidism, unspecified: Secondary | ICD-10-CM | POA: Diagnosis present

## 2016-03-02 DIAGNOSIS — Z90710 Acquired absence of both cervix and uterus: Secondary | ICD-10-CM | POA: Diagnosis not present

## 2016-03-02 DIAGNOSIS — Z9111 Patient's noncompliance with dietary regimen: Secondary | ICD-10-CM

## 2016-03-02 DIAGNOSIS — I10 Essential (primary) hypertension: Secondary | ICD-10-CM | POA: Diagnosis present

## 2016-03-02 DIAGNOSIS — I495 Sick sinus syndrome: Secondary | ICD-10-CM

## 2016-03-02 DIAGNOSIS — I481 Persistent atrial fibrillation: Secondary | ICD-10-CM | POA: Diagnosis not present

## 2016-03-02 DIAGNOSIS — J441 Chronic obstructive pulmonary disease with (acute) exacerbation: Secondary | ICD-10-CM | POA: Diagnosis not present

## 2016-03-02 DIAGNOSIS — I5033 Acute on chronic diastolic (congestive) heart failure: Secondary | ICD-10-CM | POA: Diagnosis not present

## 2016-03-02 DIAGNOSIS — I110 Hypertensive heart disease with heart failure: Secondary | ICD-10-CM | POA: Diagnosis not present

## 2016-03-02 DIAGNOSIS — R0902 Hypoxemia: Secondary | ICD-10-CM | POA: Diagnosis present

## 2016-03-02 DIAGNOSIS — I4891 Unspecified atrial fibrillation: Secondary | ICD-10-CM | POA: Diagnosis present

## 2016-03-02 DIAGNOSIS — I482 Chronic atrial fibrillation: Secondary | ICD-10-CM | POA: Diagnosis not present

## 2016-03-02 DIAGNOSIS — I2510 Atherosclerotic heart disease of native coronary artery without angina pectoris: Secondary | ICD-10-CM | POA: Diagnosis present

## 2016-03-02 DIAGNOSIS — Z7901 Long term (current) use of anticoagulants: Secondary | ICD-10-CM | POA: Diagnosis not present

## 2016-03-02 DIAGNOSIS — Z950 Presence of cardiac pacemaker: Secondary | ICD-10-CM

## 2016-03-02 DIAGNOSIS — I160 Hypertensive urgency: Secondary | ICD-10-CM | POA: Diagnosis not present

## 2016-03-02 DIAGNOSIS — E785 Hyperlipidemia, unspecified: Secondary | ICD-10-CM | POA: Diagnosis not present

## 2016-03-02 DIAGNOSIS — I509 Heart failure, unspecified: Secondary | ICD-10-CM | POA: Diagnosis not present

## 2016-03-02 DIAGNOSIS — I5021 Acute systolic (congestive) heart failure: Secondary | ICD-10-CM | POA: Diagnosis not present

## 2016-03-02 DIAGNOSIS — R0602 Shortness of breath: Secondary | ICD-10-CM | POA: Diagnosis not present

## 2016-03-02 DIAGNOSIS — M810 Age-related osteoporosis without current pathological fracture: Secondary | ICD-10-CM | POA: Diagnosis present

## 2016-03-02 DIAGNOSIS — J90 Pleural effusion, not elsewhere classified: Secondary | ICD-10-CM

## 2016-03-02 DIAGNOSIS — I5023 Acute on chronic systolic (congestive) heart failure: Secondary | ICD-10-CM | POA: Diagnosis not present

## 2016-03-02 LAB — CBC WITH DIFFERENTIAL/PLATELET
BASOS ABS: 0 10*3/uL (ref 0.0–0.1)
BASOS PCT: 0 %
EOS PCT: 0 %
Eosinophils Absolute: 0 10*3/uL (ref 0.0–0.7)
HEMATOCRIT: 36.8 % (ref 36.0–46.0)
Hemoglobin: 11.3 g/dL — ABNORMAL LOW (ref 12.0–15.0)
Lymphocytes Relative: 8 %
Lymphs Abs: 0.7 10*3/uL (ref 0.7–4.0)
MCH: 30.5 pg (ref 26.0–34.0)
MCHC: 30.7 g/dL (ref 30.0–36.0)
MCV: 99.2 fL (ref 78.0–100.0)
MONO ABS: 0.7 10*3/uL (ref 0.1–1.0)
MONOS PCT: 8 %
NEUTROS ABS: 7.4 10*3/uL (ref 1.7–7.7)
Neutrophils Relative %: 84 %
PLATELETS: 145 10*3/uL — AB (ref 150–400)
RBC: 3.71 MIL/uL — ABNORMAL LOW (ref 3.87–5.11)
RDW: 16 % — AB (ref 11.5–15.5)
WBC: 8.9 10*3/uL (ref 4.0–10.5)

## 2016-03-02 LAB — BASIC METABOLIC PANEL
ANION GAP: 11 (ref 5–15)
BUN: 13 mg/dL (ref 6–20)
CALCIUM: 10 mg/dL (ref 8.9–10.3)
CO2: 26 mmol/L (ref 22–32)
CREATININE: 0.86 mg/dL (ref 0.44–1.00)
Chloride: 105 mmol/L (ref 101–111)
GFR calc Af Amer: >60 mL/min (ref >60)
GFR, EST NON AFRICAN AMERICAN: 59 mL/min — AB (ref >60)
GLUCOSE: 159 mg/dL — AB (ref 65–99)
Potassium: 3.8 mmol/L (ref 3.5–5.1)
Sodium: 142 mmol/L (ref 135–145)

## 2016-03-02 LAB — I-STAT TROPONIN, ED: Troponin i, poc: 0.01 ng/mL (ref 0.00–0.08)

## 2016-03-02 LAB — BRAIN NATRIURETIC PEPTIDE: B Natriuretic Peptide: 430.3 pg/mL — ABNORMAL HIGH (ref 0.0–100.0)

## 2016-03-02 LAB — TSH: TSH: 1.591 u[IU]/mL (ref 0.350–4.500)

## 2016-03-02 MED ORDER — LOSARTAN POTASSIUM 50 MG PO TABS
100.0000 mg | ORAL_TABLET | Freq: Every day | ORAL | Status: DC
  Administered 2016-03-03: 100 mg via ORAL
  Filled 2016-03-02 (×2): qty 2

## 2016-03-02 MED ORDER — METOPROLOL SUCCINATE ER 50 MG PO TB24
50.0000 mg | ORAL_TABLET | Freq: Every day | ORAL | Status: DC
  Administered 2016-03-02 – 2016-03-04 (×3): 50 mg via ORAL
  Filled 2016-03-02 (×3): qty 1

## 2016-03-02 MED ORDER — FUROSEMIDE 10 MG/ML IJ SOLN
40.0000 mg | Freq: Two times a day (BID) | INTRAMUSCULAR | Status: DC
  Administered 2016-03-02 – 2016-03-04 (×4): 40 mg via INTRAVENOUS
  Filled 2016-03-02 (×4): qty 4

## 2016-03-02 MED ORDER — FUROSEMIDE 10 MG/ML IJ SOLN
40.0000 mg | Freq: Once | INTRAMUSCULAR | Status: AC
  Administered 2016-03-02: 40 mg via INTRAVENOUS
  Filled 2016-03-02: qty 4

## 2016-03-02 MED ORDER — IPRATROPIUM BROMIDE 0.02 % IN SOLN
0.5000 mg | Freq: Three times a day (TID) | RESPIRATORY_TRACT | Status: DC
  Administered 2016-03-03: 0.5 mg via RESPIRATORY_TRACT
  Filled 2016-03-02: qty 2.5

## 2016-03-02 MED ORDER — AMIODARONE HCL 200 MG PO TABS
200.0000 mg | ORAL_TABLET | Freq: Every day | ORAL | Status: DC
  Administered 2016-03-02: 100 mg via ORAL
  Filled 2016-03-02 (×2): qty 1

## 2016-03-02 MED ORDER — LEVOTHYROXINE SODIUM 75 MCG PO TABS
75.0000 ug | ORAL_TABLET | Freq: Every day | ORAL | Status: DC
  Administered 2016-03-03 – 2016-03-04 (×2): 75 ug via ORAL
  Filled 2016-03-02 (×2): qty 1

## 2016-03-02 MED ORDER — SODIUM CHLORIDE 0.9% FLUSH
3.0000 mL | Freq: Two times a day (BID) | INTRAVENOUS | Status: DC
  Administered 2016-03-02 – 2016-03-04 (×4): 3 mL via INTRAVENOUS

## 2016-03-02 MED ORDER — ONDANSETRON HCL 4 MG/2ML IJ SOLN: 4.0000 mg | Freq: Four times a day (QID) | INTRAMUSCULAR | Status: DC | PRN

## 2016-03-02 MED ORDER — SODIUM CHLORIDE 0.9 % IV SOLN: 250.0000 mL | INTRAVENOUS | Status: DC | PRN

## 2016-03-02 MED ORDER — IPRATROPIUM BROMIDE 0.02 % IN SOLN
0.5000 mg | Freq: Four times a day (QID) | RESPIRATORY_TRACT | Status: DC
  Administered 2016-03-02 (×2): 0.5 mg via RESPIRATORY_TRACT
  Filled 2016-03-02 (×2): qty 2.5

## 2016-03-02 MED ORDER — SENNA 8.6 MG PO TABS
1.0000 | ORAL_TABLET | Freq: Every evening | ORAL | Status: DC | PRN
  Filled 2016-03-02: qty 1

## 2016-03-02 MED ORDER — ACETAMINOPHEN 325 MG PO TABS
650.0000 mg | ORAL_TABLET | ORAL | Status: DC | PRN
  Administered 2016-03-02: 650 mg via ORAL
  Filled 2016-03-02: qty 2

## 2016-03-02 MED ORDER — LEVALBUTEROL HCL 0.63 MG/3ML IN NEBU
0.6300 mg | INHALATION_SOLUTION | Freq: Four times a day (QID) | RESPIRATORY_TRACT | Status: DC
  Administered 2016-03-02 (×2): 0.63 mg via RESPIRATORY_TRACT
  Filled 2016-03-02 (×2): qty 3

## 2016-03-02 MED ORDER — SODIUM CHLORIDE 0.9% FLUSH: 3.0000 mL | INTRAVENOUS | Status: DC | PRN

## 2016-03-02 MED ORDER — IPRATROPIUM-ALBUTEROL 0.5-2.5 (3) MG/3ML IN SOLN
3.0000 mL | Freq: Once | RESPIRATORY_TRACT | Status: AC
  Administered 2016-03-02: 3 mL via RESPIRATORY_TRACT
  Filled 2016-03-02: qty 3

## 2016-03-02 MED ORDER — LEVALBUTEROL HCL 0.63 MG/3ML IN NEBU: 0.6300 mg | INHALATION_SOLUTION | Freq: Four times a day (QID) | RESPIRATORY_TRACT | Status: DC | PRN

## 2016-03-02 MED ORDER — CALCIUM CARBONATE-VITAMIN D 500-200 MG-UNIT PO TABS
1.0000 | ORAL_TABLET | Freq: Two times a day (BID) | ORAL | Status: DC
  Administered 2016-03-02 – 2016-03-04 (×4): 1 via ORAL
  Filled 2016-03-02 (×4): qty 1

## 2016-03-02 MED ORDER — ZOLPIDEM TARTRATE 5 MG PO TABS
5.0000 mg | ORAL_TABLET | Freq: Every evening | ORAL | Status: DC | PRN
  Administered 2016-03-02 – 2016-03-03 (×2): 5 mg via ORAL
  Filled 2016-03-02 (×2): qty 1

## 2016-03-02 MED ORDER — ADULT MULTIVITAMIN W/MINERALS CH
1.0000 | ORAL_TABLET | Freq: Every day | ORAL | Status: DC
  Administered 2016-03-02 – 2016-03-04 (×3): 1 via ORAL
  Filled 2016-03-02 (×3): qty 1

## 2016-03-02 MED ORDER — RIVAROXABAN 20 MG PO TABS
20.0000 mg | ORAL_TABLET | Freq: Every day | ORAL | Status: DC
  Administered 2016-03-02: 20 mg via ORAL
  Filled 2016-03-02: qty 1

## 2016-03-02 MED ORDER — LEVALBUTEROL HCL 0.63 MG/3ML IN NEBU
0.6300 mg | INHALATION_SOLUTION | Freq: Three times a day (TID) | RESPIRATORY_TRACT | Status: DC
  Administered 2016-03-03: 0.63 mg via RESPIRATORY_TRACT
  Filled 2016-03-02: qty 3

## 2016-03-02 MED ORDER — BENZONATATE 100 MG PO CAPS: 100.0000 mg | ORAL_CAPSULE | Freq: Three times a day (TID) | ORAL | Status: DC | PRN

## 2016-03-02 MED ORDER — VITAMIN C 500 MG PO TABS
500.0000 mg | ORAL_TABLET | Freq: Every day | ORAL | Status: DC
  Administered 2016-03-02 – 2016-03-04 (×3): 500 mg via ORAL
  Filled 2016-03-02 (×3): qty 1

## 2016-03-02 MED ORDER — NITROGLYCERIN IN D5W 200-5 MCG/ML-% IV SOLN
5.0000 ug/min | INTRAVENOUS | Status: DC
  Administered 2016-03-02: 5 ug/min via INTRAVENOUS
  Filled 2016-03-02: qty 250

## 2016-03-02 MED ORDER — ONE-DAILY MULTI VITAMINS PO TABS: 1.0000 | ORAL_TABLET | Freq: Every day | ORAL | Status: DC

## 2016-03-02 NOTE — ED Notes (Signed)
MD Langeland notified of patient blood pressure maintaining well in the 123456 systolic without nitro drip. Advised to stop drip. Placing orders for telemetry bed.

## 2016-03-02 NOTE — ED Provider Notes (Signed)
[place] DEPT Provider Note   CSN: [id number] Arrival date & time: 03/02/16  [id number]    History   Chief Complaint Chief Complaint  Patient presents with  . Shortness of Breath    HPI Christine Perez is a 82 y.o. female who presents with SOB. PMH significant for COPD, A fib on chronic anticoagulation (Xarelto) s/p pacemaker, HTN, HLD. She lives at home with her daughter. She states she got up to go to the bathroom last night and on her way back became acutely SOB. She reports associated occasional productive cough and has an inhaler at home which she uses every night. She used it last night with no relief. She also endorses chronic fatigue for several weeks. Denies recent fever, chills, headache, URI symptoms, chest pain, abdominal pain, N/V. EMS was called and they noted her to be hypoxic as low as 77% on RA. She was placed on 4L O2 via . No hx of CHF. Echo in May 2015 showed EF 55-60%.  HPI  Past Medical History:  Diagnosis Date  . Atrial fibrillation ([place])   . CARDIAC ARRHYTHMIA 01/08/2010  . COLONIC POLYPS, HX OF 10/09/2006  . COPD (chronic obstructive pulmonary disease) ([place])   . HYPERLIPIDEMIA 10/09/2006  . HYPERTENSION 10/09/2006  . HYPOTHYROIDISM 10/09/2006  . LASSITUDE 08/15/2009  . OSTEOPOROSIS 10/09/2006  . VERTIGO 08/07/2009  . Wears glasses   . Wears hearing aid    left    Patient Active Problem List   Diagnosis Date Noted  . Chronic anticoagulation 08/03/2013  . Atrial flutter ([place]) 08/03/2013  . Atrial fibrillation ([place]) 08/02/2013  . Lobular carcinoma in situ of left breast 06/03/2012  . CARDIAC ARRHYTHMIA 01/08/2010  . LASSITUDE 08/15/2009  . VERTIGO 08/07/2009  . Hypothyroidism 10/09/2006  . Dyslipidemia 10/09/2006  . Essential hypertension 10/09/2006  . Osteoporosis 10/09/2006  . History of colonic polyps 10/09/2006    Past Surgical History:  Procedure Laterality Date  . ABDOMINAL HYSTERECTOMY    . APPENDECTOMY    . BREAST SURGERY     left -  lumpectomy  . CARDIOVERSION N/A 08/09/2013   Procedure: CARDIOVERSION;  Surgeon: Candee Furbish, MD;  Location: [facility] ENDOSCOPY;  Service: Cardiovascular;  Laterality: N/A;  . CATARACT EXTRACTION    . CHOLECYSTECTOMY    . PACEMAKER PLACEMENT N/A 12/05/2013  . PARTIAL MASTECTOMY WITH NEEDLE LOCALIZATION Left 06/29/2012   Procedure: PARTIAL MASTECTOMY WITH NEEDLE LOCALIZATION;  Surgeon: Adin Hector, MD;  Location: [facility];  Service: General;  Laterality: Left;  . TONSILLECTOMY      OB History    No data available       Home Medications    Prior to Admission medications   Medication Sig Start Date End Date Taking? Authorizing Provider  amiodarone (PACERONE) 400 MG tablet Take 200 mg by mouth daily.  08/23/14   Historical Provider, MD  calcium-vitamin D (OSCAL WITH D) 500-200 MG-UNIT per tablet Take 1 tablet by mouth 2 (two) times daily.     Historical Provider, MD  ipratropium (ATROVENT HFA) 17 MCG/ACT inhaler Inhale 2 puffs into the lungs every 4 (four) hours as needed for wheezing. 01/12/15   Dorothyann Peng, NP  losartan (COZAAR) 100 MG tablet TAKE ONE TABLET BY MOUTH ONCE DAILY 09/10/15   Marletta Lor, MD  Multiple Vitamin (MULTIVITAMIN) tablet Take 1 tablet by mouth daily.    Historical Provider, MD  pravastatin (PRAVACHOL) 20 MG tablet Take 20 mg by mouth. 07/19/15   Historical Provider, MD  Wilmer Floor  75 MCG tablet TAKE ONE TABLET BY MOUTH ONCE DAILY 02/05/16   Marletta Lor, MD  tiZANidine (ZANAFLEX) 2 MG tablet TAKE ONE TABLET BY MOUTH EVERY 12 HOURS AS NEEDED FOR MUSCLE SPASM 01/22/16   Marletta Lor, MD  traMADol (ULTRAM) 50 MG tablet Take 2 tablets (100 mg total) by mouth every 6 (six) hours as needed (pain). 01/30/16   Marletta Lor, MD  vitamin C (ASCORBIC ACID) 500 MG tablet Take 500 mg by mouth daily.    Historical Provider, MD  XARELTO 20 MG TABS tablet TAKE ONE TABLET BY MOUTH ONCE DAILY WITH  SUPPER 12/18/15   Marletta Lor, MD    zolpidem (AMBIEN) 5 MG tablet Take 1 tablet (5 mg total) by mouth at bedtime. 09/04/15   Marletta Lor, MD    Family History History reviewed. No pertinent family history.  Social History Social History  Substance Use Topics  . Smoking status: Never Smoker  . Smokeless tobacco: Never Used  . Alcohol use No     Allergies   Codeine   Review of Systems Review of Systems  Constitutional: Positive for fatigue (chronic). Negative for chills and fever.  HENT: Negative for congestion and sore throat.   Respiratory: Positive for cough, shortness of breath and wheezing.   Cardiovascular: Negative for chest pain.  Gastrointestinal: Negative for abdominal pain, nausea and vomiting.  Neurological: Negative for headaches.  All other systems reviewed and are negative.    Physical Exam Updated Vital Signs BP (!) 211/110 (BP Location: Left Arm)   Pulse 97   Temp 98.2 F (36.8 C) (Oral)   Resp (!) 30   SpO2 97%   Physical Exam  Constitutional: She is oriented to person, place, and time. She appears well-developed and well-nourished. No distress.  HENT:  Head: Normocephalic and atraumatic.  Eyes: Conjunctivae are normal. Pupils are equal, round, and reactive to light. Right eye exhibits no discharge. Left eye exhibits no discharge. No scleral icterus.  Neck: Normal range of motion.  Cardiovascular: Normal rate and regular rhythm.  Exam reveals no gallop and no friction rub.   No murmur heard. Pulmonary/Chest: Tachypnea noted. No respiratory distress. She has wheezes. She has rales. She exhibits no tenderness.  Expiratory wheezes in upper lobes. Bibasilar crackles  Abdominal: Soft. Bowel sounds are normal. She exhibits no distension and no mass. There is no tenderness. There is no rebound and no guarding. No hernia.  Musculoskeletal:  Trace bilateral lower leg edema  Neurological: She is alert and oriented to person, place, and time.  Skin: Skin is warm and dry.   Psychiatric: She has a normal mood and affect. Her behavior is normal.  Nursing note and vitals reviewed.    ED Treatments / Results  Labs (all labs ordered are listed, but only abnormal results are displayed) Labs Reviewed  BASIC METABOLIC PANEL - Abnormal; Notable for the following:       Result Value   Glucose, Bld 159 (*)    GFR calc non Af Amer 59 (*)    All other components within normal limits  CBC WITH DIFFERENTIAL/PLATELET - Abnormal; Notable for the following:    RBC 3.71 (*)    Hemoglobin 11.3 (*)    RDW 16.0 (*)    Platelets 145 (*)    All other components within normal limits  Randolm Idol, ED    EKG  EKG Interpretation  Date/Time:  Sunday March 02 2016 07:59:41 EST Ventricular Rate:  90 PR Interval:  QRS Duration: 102 QT Interval:  391 QTC Calculation: 479 R Axis:   65 Text Interpretation:  Atrial fibrillation Borderline repolarization abnormality Confirmed by Wyvonnia Dusky  MD, STEPHEN [id number]) on 03/02/2016 8:28:11 AM       Radiology Dg Chest Portable 1 View  Result Date: 03/02/2016 CLINICAL DATA:  Shortness of Breath EXAM: PORTABLE CHEST 1 VIEW COMPARISON:  01/12/2015 FINDINGS: Cardiomegaly with vascular congestion. Increasing interstitial prominence likely reflects early interstitial edema. Left pacer is unchanged. Small bilateral pleural effusions, left greater than right. IMPRESSION: Cardiomegaly with mild edema/ CHF. Small bilateral effusions. Electronically Signed   By: Rolm Baptise M.D.   On: 03/02/2016 09:33    Procedures Procedures (including critical care time)  Medications Ordered in ED Medications  nitroGLYCERIN 50 mg in dextrose 5 % 250 mL (0.2 mg/mL) infusion (5 mcg/min Intravenous New Bag/Given 03/02/16 1016)  ipratropium-albuterol (DUONEB) 0.5-2.5 (3) MG/3ML nebulizer solution 3 mL (3 mLs Nebulization Given 03/02/16 0849)  furosemide (LASIX) injection 40 mg (40 mg Intravenous Given 03/02/16 1016)     Initial Impression /  Assessment and Plan / ED Course  I have reviewed the triage vital signs and the nursing notes.  Pertinent labs & imaging results that were available during my care of the patient were reviewed by me and considered in my medical decision making (see chart for details).  Clinical Course as of Mar 02 1520  Sun Mar 02, 2016  1519 BP: (!) 211/110 [KG]    Clinical Course User Index [KG] Recardo Evangelist, PA-C   82 year old female presents with CHF exacerbation. Initially she had some wheezing therefore Duoneb ordered. She was hypoxic and markedly hypertensive.   On repeat lung exam wheezing has improved but it's now noted patient has bibasilar crackles. CBC remarkable for mild anemia. BMP remarkable for glucose 159. Troponin is 0.01. BNP s 430.3. CXR remarkable for cardiomegaly with vascular congestion and early interstitial edema with small bilateral pleural effusions. Lasix and Nitro drip ordered.  Shared visit with Dr. Jeanell Sparrow. On recheck patient's BP is trending down nicely. Patient reports subjective improvement in SOB. She has good urinary output. Discussed admission with hospitalist to telemetry bed. Appreciate assistance.   Final Clinical Impressions(s) / ED Diagnoses   Final diagnoses:  Acute on chronic congestive heart failure, unspecified congestive heart failure type [facility])    New Prescriptions New Prescriptions   No medications on file     Recardo Evangelist, PA-C 03/02/16 1524    Pattricia Boss, MD 03/03/16 [id number]

## 2016-03-02 NOTE — ED Triage Notes (Signed)
Pt to ER by [facility] from home for sudden onset shortness of breath on awakening this morning; hx of COPD. Does not wear daily O2. On RA, EMS reports sats as low as 77. Pt on 4L nasal cannula, able to speak in sentences but is very labored. Skin warm and dry. A/O x4. Denies chest pain.

## 2016-03-02 NOTE — H&P (Addendum)
[facility] History and Physical  Christine Perez [id number] DOB: 04/27/1925 DOA: 03/02/2016  Referring physician: ER PCP: Nyoka Cowden, MD   Chief Complaint: sob  HPI: Christine Perez is a 82 y.o. female with significant history of chronic atrial fib status post pacemaker, on chronic anticoagulation, which Xarelto; hypertension, hypothyroidism, dyslipidemia and history of COPD.  Patient presents acutely to the ER secondary to worsening shortness of breath that started this morning around 3 AM that woke her up from sleep. Of note, patient and family members are giving history together. Patient has been c/o of fatigue and malaise for the last week or so, eating a lot of canned soups lately as well.  Of note, she says she went to bed well, but she did notice 2 pillow orthopnea, which is new for her. She usually she sleeps with one pillow.  Denies noticing any lower extremity edema.  Denies any chest pain or palpitations, no syncope/presyncope/.  Of note, she has had frequent falls lately though, uses both cane and walker.  She recently saw her Cardiologist on 02/26/16, and at that time felt well.  Upon arrival to the ED, she was acutely short of breath, wheezing. The ER gave her DuoNeb treatment 1, as well as Lasix 20 mg IV 1 and started on her nitroglycerin drip secondary to hypertensive urgency.  Since then she feels much better and now the nitroglycerin drip is being shut off.   Revie to consume of Systems:  Per hpi, o/w all systems reviewed and negative.  Past Medical History:  Diagnosis Date  . Atrial fibrillation ([place])   . CARDIAC ARRHYTHMIA 01/08/2010  . COLONIC POLYPS, HX OF 10/09/2006  . COPD (chronic obstructive pulmonary disease) ([place])   . HYPERLIPIDEMIA 10/09/2006  . HYPERTENSION 10/09/2006  . HYPOTHYROIDISM 10/09/2006  . LASSITUDE 08/15/2009  . OSTEOPOROSIS 10/09/2006  . VERTIGO 08/07/2009  . Wears glasses   . Wears hearing aid    left   Past Surgical  History:  Procedure Laterality Date  . ABDOMINAL HYSTERECTOMY    . APPENDECTOMY    . BREAST SURGERY     left - lumpectomy  . CARDIOVERSION N/A 08/09/2013   Procedure: CARDIOVERSION;  Surgeon: Candee Furbish, MD;  Location: [facility] ENDOSCOPY;  Service: Cardiovascular;  Laterality: N/A;  . CATARACT EXTRACTION    . CHOLECYSTECTOMY    . PACEMAKER PLACEMENT N/A 12/05/2013  . PARTIAL MASTECTOMY WITH NEEDLE LOCALIZATION Left 06/29/2012   Procedure: PARTIAL MASTECTOMY WITH NEEDLE LOCALIZATION;  Surgeon: Adin Hector, MD;  Location: [place];  Service: General;  Laterality: Left;  . TONSILLECTOMY     Social History:  reports that she has never smoked. She has never used smokeless tobacco. She reports that she does not drink alcohol or use drugs.  Allergies  Allergen Reactions  . Codeine Shortness Of Breath    Difficulty breathing    History reviewed. No pertinent family history. reviewed, noncontributory.  Prior to Admission medications   Medication Sig Start Date End Date Taking? Authorizing Provider  amiodarone (PACERONE) 400 MG tablet Take 200 mg by mouth daily.  08/23/14   Historical Provider, MD  calcium-vitamin D (OSCAL WITH D) 500-200 MG-UNIT per tablet Take 1 tablet by mouth 2 (two) times daily.     Historical Provider, MD  ipratropium (ATROVENT HFA) 17 MCG/ACT inhaler Inhale 2 puffs into the lungs every 4 (four) hours as needed for wheezing. 01/12/15   Dorothyann Peng, NP  losartan (COZAAR) 100 MG tablet TAKE ONE TABLET BY  MOUTH ONCE DAILY 09/10/15   Marletta Lor, MD  Multiple Vitamin (MULTIVITAMIN) tablet Take 1 tablet by mouth daily.    Historical Provider, MD  pravastatin (PRAVACHOL) 20 MG tablet Take 20 mg by mouth. 07/19/15   Historical Provider, MD  SYNTHROID 75 MCG tablet TAKE ONE TABLET BY MOUTH ONCE DAILY 02/05/16   Marletta Lor, MD  tiZANidine (ZANAFLEX) 2 MG tablet TAKE ONE TABLET BY MOUTH EVERY 12 HOURS AS NEEDED FOR MUSCLE SPASM 01/22/16   Marletta Lor, MD  traMADol (ULTRAM) 50 MG tablet Take 2 tablets (100 mg total) by mouth every 6 (six) hours as needed (pain). 01/30/16   Marletta Lor, MD  vitamin C (ASCORBIC ACID) 500 MG tablet Take 500 mg by mouth daily.    Historical Provider, MD  XARELTO 20 MG TABS tablet TAKE ONE TABLET BY MOUTH ONCE DAILY WITH  SUPPER 12/18/15   Marletta Lor, MD  zolpidem (AMBIEN) 5 MG tablet Take 1 tablet (5 mg total) by mouth at bedtime. 09/04/15   Marletta Lor, MD   Physical Exam: Vitals:   03/02/16 0945 03/02/16 1000 03/02/16 1030 03/02/16 1115  BP: 177/85 173/87 148/88 141/92  Pulse: 92 95 97 94  Resp: 25 20 20 18   Temp:      TempSrc:      SpO2: 95% 93% 91% 95%    Wt Readings from Last 3 Encounters:  02/12/16 56.9 kg (125 lb 8 oz)  01/08/16 58.4 kg (128 lb 11.2 oz)  09/04/15 60.3 kg (133 lb)    General:  Appears calm and comfortable, pleasant, NAD, AAOx3, so pleasant Eyes: PERRL, normal lids, irises & conjunctiva ENT: grossly normal hearing, lips & tongue, mmm Neck: +jvp 7cm Cardiovascular: irreg irreg, no m/r/g. 1+ bilat le edema, legs nttp.   Telemetry:  afib,rate control Respiratory: dull & diminished at bases, crackles to mid lung fields. Abdomen: soft, ntnd Skin: no rash or induration seen on limited exam Musculoskeletal: grossly normal tone BUE/BLE Psychiatric: grossly normal mood and affect, speech fluent and appropriate Neurologic: grossly non-focal.          Labs on Admission:  Basic Metabolic Panel:  Recent Labs Lab 03/02/16 0821  NA 142  K 3.8  CL 105  CO2 26  GLUCOSE 159*  BUN 13  CREATININE 0.86  CALCIUM 10.0   Liver Function Tests: No results for input(s): AST, ALT, ALKPHOS, BILITOT, PROT, ALBUMIN in the last 168 hours. No results for input(s): LIPASE, AMYLASE in the last 168 hours. No results for input(s): AMMONIA in the last 168 hours. CBC:  Recent Labs Lab 03/02/16 0821  WBC 8.9  NEUTROABS 7.4  HGB 11.3*  HCT 36.8  MCV  99.2  PLT 145*   Cardiac Enzymes: No results for input(s): CKTOTAL, CKMB, CKMBINDEX, TROPONINI in the last 168 hours.  BNP (last 3 results)  Recent Labs  03/02/16 0933  BNP 430.3*    ProBNP (last 3 results) No results for input(s): PROBNP in the last 8760 hours.  CBG: No results for input(s): GLUCAP in the last 168 hours.  Radiological Exams on Admission: Dg Chest Portable 1 View  Result Date: 03/02/2016 CLINICAL DATA:  Shortness of Breath EXAM: PORTABLE CHEST 1 VIEW COMPARISON:  01/12/2015 FINDINGS: Cardiomegaly with vascular congestion. Increasing interstitial prominence likely reflects early interstitial edema. Left pacer is unchanged. Small bilateral pleural effusions, left greater than right. IMPRESSION: Cardiomegaly with mild edema/ CHF. Small bilateral effusions. Electronically Signed   By: Rolm Baptise M.D.  On: 03/02/2016 09:33    EKG: Independently reviewed.  EKG Interpretation  Date/Time:  Sunday March 02 2016 07:59:41 EST Ventricular Rate:  90 PR Interval:    QRS Duration: 102 QT Interval:  391 QTC Calculation: 479 R Axis:   65 Text Interpretation:  Atrial fibrillation Borderline repolarization abnormality Confirmed by Wyvonnia Dusky  MD, STEPHEN ([id number]) on 03/02/2016 8:28:11 AM      Echo 5/15 - Left ventricle: The cavity size was normal. Systolic function was normal. The estimated ejection fraction was in the range of 55% to 60%. Wall motion was normal; there were no regional wall motion abnormalities. The study was not technically sufficient to allow evaluation of LV diastolic dysfunction due to atrial fibrillation. - Aortic valve: Trileaflet; normal thickness leaflets. No regurgitation. - Mitral valve: Structurally normal valve. Mild regurgitation. - Left atrium: The atrium was mildly dilated. - Right ventricle: Systolic function was normal. - Right atrium: The atrium was moderately dilated. - Tricuspid valve: Moderate regurgitation. -  Pulmonary arteries: Systolic pressure was within the normal range. PA peak pressure: 7mm Hg (S).   Assessment/Plan Principal Problem:   CHF exacerbation (HCC) Active Problems:   COPD with acute exacerbation ([place])   Hypertensive urgency   Essential hypertension   Atrial fibrillation (HCC)   Chronic anticoagulation   Pleural effusion   1. Congestive heart failure, suspect due to dietary indiscretions with salt intake, last echo 5/15 showed ef 0000000 w/ diastolic dysfunction due to atrial fib - admit tele - echo  - continue lasix 40iv bid - strict I/os - low salt diet discussed w/ pt and family - heart failure education per protocol - RT for IS while awake  2. Hypertensive urgency - initial sbp 210s, may have exacerbated chf - improved w/ ntg gtt, attempting to wean off now. - resume home bp meds. - nml bp at cardiology visit 02/26/16 - bp after turning off ntg gtt - 130/77 - resume metoprolol and losartan  3. Copd exacerbation - possibly, presented w/ wheezing, but could have been cardiac asthma as well. - continue scheduled duonebs (xopenex/impratropium) for now, no signs of coughing/sputum production, afebrile - will hold off steroids/abx for now.  4. Afib, chronic, w/ hx of ppm and on chronic anticogulation w/ Xarelto - with noted more frequent falls recently,  - consider stopping Xarelto if more frequent falls. - continue amiodarone 200/qd  5. Sick Sinus Syndrome, sp PPM - recently saw her cardiologist at [facility], 02/26/16 - bp normaltensive there, 130/90  6. Cad involving native ccronary artery - no c/o of angina currently  7.  MI, old - inferior, per imaging,  no cath/PCI  8. Frequent falls - will ask PT/OT to eval in am.  9. Hypothyroidism - chk tsh,  - continue same synthroid for now.   Code Status: Full DVT Prophylaxis: on Xarelto Family Communication: patient, dgt and son-in-law at bedside Disposition Plan: inpt.  Time spent: 66mins  Maren Reamer MD., MBA/MHA [facility] Pager [phone]

## 2016-03-03 ENCOUNTER — Inpatient Hospital Stay (HOSPITAL_COMMUNITY): Payer: Medicare Other

## 2016-03-03 DIAGNOSIS — I509 Heart failure, unspecified: Secondary | ICD-10-CM

## 2016-03-03 DIAGNOSIS — I5023 Acute on chronic systolic (congestive) heart failure: Secondary | ICD-10-CM

## 2016-03-03 DIAGNOSIS — Z7901 Long term (current) use of anticoagulants: Secondary | ICD-10-CM

## 2016-03-03 LAB — BASIC METABOLIC PANEL
Anion gap: 9 (ref 5–15)
BUN: 12 mg/dL (ref 6–20)
CHLORIDE: 100 mmol/L — AB (ref 101–111)
CO2: 31 mmol/L (ref 22–32)
Calcium: 9.2 mg/dL (ref 8.9–10.3)
Creatinine, Ser: 0.95 mg/dL (ref 0.44–1.00)
GFR calc non Af Amer: 52 mL/min — ABNORMAL LOW (ref >60)
Glucose, Bld: 85 mg/dL (ref 65–99)
Potassium: 3 mmol/L — ABNORMAL LOW (ref 3.5–5.1)
Sodium: 140 mmol/L (ref 135–145)

## 2016-03-03 LAB — CBC
HCT: 29.4 % — ABNORMAL LOW (ref 36.0–46.0)
HEMOGLOBIN: 9.3 g/dL — AB (ref 12.0–15.0)
MCH: 30.7 pg (ref 26.0–34.0)
MCHC: 31.6 g/dL (ref 30.0–36.0)
MCV: 97 fL (ref 78.0–100.0)
PLATELETS: 124 10*3/uL — AB (ref 150–400)
RBC: 3.03 MIL/uL — AB (ref 3.87–5.11)
RDW: 15.9 % — ABNORMAL HIGH (ref 11.5–15.5)
WBC: 4.3 10*3/uL (ref 4.0–10.5)

## 2016-03-03 LAB — ECHOCARDIOGRAM COMPLETE
Height: 58 in
WEIGHTICAEL: 1856 [oz_av]

## 2016-03-03 LAB — TROPONIN I: TROPONIN I: 0.07 ng/mL — AB (ref <0.03)

## 2016-03-03 MED ORDER — LOSARTAN POTASSIUM 50 MG PO TABS
50.0000 mg | ORAL_TABLET | Freq: Every day | ORAL | Status: DC
  Administered 2016-03-04: 50 mg via ORAL
  Filled 2016-03-03: qty 1

## 2016-03-03 MED ORDER — AMIODARONE HCL 100 MG PO TABS
100.0000 mg | ORAL_TABLET | Freq: Every day | ORAL | Status: DC
  Administered 2016-03-03: 100 mg via ORAL
  Filled 2016-03-03: qty 1

## 2016-03-03 MED ORDER — RIVAROXABAN 15 MG PO TABS
15.0000 mg | ORAL_TABLET | Freq: Every day | ORAL | Status: DC
  Administered 2016-03-03 – 2016-03-04 (×2): 15 mg via ORAL
  Filled 2016-03-03 (×2): qty 1

## 2016-03-03 MED ORDER — POTASSIUM CHLORIDE CRYS ER 20 MEQ PO TBCR
40.0000 meq | EXTENDED_RELEASE_TABLET | Freq: Three times a day (TID) | ORAL | Status: AC
  Administered 2016-03-03 – 2016-03-04 (×3): 40 meq via ORAL
  Filled 2016-03-03 (×3): qty 2

## 2016-03-03 MED ORDER — AMIODARONE HCL 100 MG PO TABS: 100.0000 mg | ORAL_TABLET | Freq: Every day | ORAL | Status: DC

## 2016-03-03 NOTE — Consult Note (Signed)
[facility] Primary Care Navigator  03/03/2016  Christine Perez 1925-03-21 [id number]   Met with patient at the bedside to identify possible discharge needs. Patient reports feeling "low in energy" and worsening shortness of breath which had led to this admission.  Patient endorses Christine Perez with [facility] at [place] as the primary care provider.    Patient shared using [name] in [place] to obtain medications with no problem so far, as she is still able to afford Xarelto even in the "donut hole" as stated.  Patient reports having medication supply until the end of this year. Patient managing her own medications at home using "pill box" system weekly.   She states being able to drive and independently take care of herself prior to this admission. Daughter Christine Perez) will be able provide transportation to her doctors' appointments after discharge.  Daughter will be her primary caregiver at home.   Discharge plan is to go home with daughter to assist with care needs since daughter works from home per patient.  Patient voiced understanding to call primary care provider's office when she gets home, for a post discharge follow-up appointment within a week or sooner if needs arise. Patient letter provided as a reminder. Patient mentioned that she has a prior scheduled appointment to see PCP on 03/18/16 although expressed understanding to call PCP office to reschedule appointment at an earlier date to be seen within 7- 14 days after discharge.   Patient admits lack of knowledge managing her HF and COPD. She requested for a brochure about Christine Perez care management services to discuss with daughter before making her decision to accept Christine Perez services. [facility] contact information provided for any questions that may arise. Patient was also provided option to discuss with primary care provider regarding Christine Perez services and have PCP refer patient to Christine Perez CM if deemed  appropriate for services.   For additional questions please contact:  Christine Perez, BSN, RN-BC [facility] PRIMARY CARE Navigator Cell: [phone]

## 2016-03-03 NOTE — Evaluation (Signed)
Physical Therapy Evaluation Patient Details Name: NEVINA YORKER MRN: [id number] DOB: 28-May-1925 Today's Date: 03/03/2016   History of Present Illness  Patient is an 82 yo female admitted 03/02/16 with SOB.  Patient with CHF exacerbation, COPD, and hypertensive urgency.    PMH:  Afib, pacemaker, HTN, HLD, COPD, CAD, MI, HOH, vertigo, frequent falls since October 2017.    Clinical Impression  Patient presents with problems listed below.  Will benefit from acute PT to maximize functional mobility prior to discharge home with family.  Patient with general weakness with recent decline.  Recommend HHPT at d/c for continued therapy.    Follow Up Recommendations Home health PT;Supervision for mobility/OOB    Equipment Recommendations  None recommended by PT    Recommendations for Other Services       Precautions / Restrictions Precautions Precautions: Fall Precaution Comments: Per patient, falls are occurring in bathroom/bathtub.  Daughter is putting in walk-in shower with seat and hand-held shower, and grab bars in bathroom. Restrictions Weight Bearing Restrictions: No      Mobility  Bed Mobility Overal bed mobility: Modified Independent                Transfers Overall transfer level: Needs assistance Equipment used: Rolling walker (2 wheeled) Transfers: Sit to/from Stand Sit to Stand: Min guard         General transfer comment: Verbal cues for hand placement.  Assist for safety.  Ambulation/Gait Ambulation/Gait assistance: Min guard Ambulation Distance (Feet): 80 Feet Assistive device: Rolling walker (2 wheeled) Gait Pattern/deviations: Step-through pattern;Decreased stride length;Shuffle;Trunk flexed Gait velocity: decreased Gait velocity interpretation: Below normal speed for age/gender General Gait Details: Cues to keep feet inside RW and stand upright (as much as possible) during gait.  O2 sats remained in 90's during gait on room air.  Stairs             Wheelchair Mobility    Modified Rankin (Stroke Patients Only)       Balance Overall balance assessment: Needs assistance;History of Falls Sitting-balance support: No upper extremity supported;Feet supported Sitting balance-Leahy Scale: Good     Standing balance support: Bilateral upper extremity supported Standing balance-Leahy Scale: Poor                               Pertinent Vitals/Pain Pain Assessment: No/denies pain    Home Living Family/patient expects to be discharged to:: Private residence Living Arrangements: Children Available Help at Discharge: Family;Available 24 hours/day (Daughter works from home. Son-in-law also assists) Type of Home: House Home Access: Stairs to enter Entrance Stairs-Rails: Psychiatric nurse of Steps: 3 Home Layout: One level Home Equipment: Walker - 2 wheels;Cane - single point (Will have hand-held shower head and grab bars)      Prior Function Level of Independence: Independent with assistive device(s)         Comments: Recently using RW for safety.  Has been bathing independently - may need assist due to falls.  Patient drives.     Hand Dominance        Extremity/Trunk Assessment   Upper Extremity Assessment Upper Extremity Assessment: Defer to OT evaluation    Lower Extremity Assessment Lower Extremity Assessment: Generalized weakness    Cervical / Trunk Assessment Cervical / Trunk Assessment: Kyphotic  Communication   Communication: HOH  Cognition Arousal/Alertness: Awake/alert Behavior During Therapy: WFL for tasks assessed/performed Overall Cognitive Status: Within Functional Limits for tasks assessed  General Comments      Exercises     Assessment/Plan    PT Assessment Patient needs continued PT services  PT Problem List Decreased strength;Decreased activity tolerance;Decreased balance;Decreased mobility;Decreased knowledge of use of  DME;Cardiopulmonary status limiting activity          PT Treatment Interventions DME instruction;Gait training;Stair training;Functional mobility training;Therapeutic activities;Patient/family education    PT Goals (Current goals can be found in the Care Plan section)  Acute Rehab PT Goals Patient Stated Goal: To return home PT Goal Formulation: With patient Time For Goal Achievement: 03/10/16 Potential to Achieve Goals: Good    Frequency Min 3X/week   Barriers to discharge        Co-evaluation               End of Session Equipment Utilized During Treatment: Gait belt Activity Tolerance: Patient tolerated treatment well;Patient limited by fatigue Patient left: in bed;with call bell/phone within reach;with bed alarm set Nurse Communication: Mobility status (O2 sats in 90's during gait on room air)         Time: 1336-1401 PT Time Calculation (min) (ACUTE ONLY): 25 min   Charges:   PT Evaluation $PT Eval Moderate Complexity: 1 Procedure PT Treatments $Gait Training: 8-22 mins   PT G Codes:        Despina Pole 2016/03/12, 2:32 PM Carita Pian. Sanjuana Kava, [place] Pager [phone]

## 2016-03-03 NOTE — Progress Notes (Signed)
  Echocardiogram 2D Echocardiogram has been performed.  Tresa Res 03/03/2016, 3:56 PM

## 2016-03-03 NOTE — Progress Notes (Signed)
Pt and daughter who was at bedside last night verified that pt only takes 100mg  Amiodarone at bedtime (once/day).  Pharmacist notified who updated order in epic.  Pt states that when she began taking this med, she was advised to cut the pill in half so that she could tolerate this "stronger medicine".  She continued to take only half (100mg ) and has never increased her dose to 200mg .

## 2016-03-03 NOTE — Progress Notes (Signed)
Home for xarelto Indication: atrial fibrillation  Allergies  Allergen Reactions  . Codeine Shortness Of Breath    Difficulty breathing    Patient Measurements: Height: 4\' 10"  (147.3 cm) Weight: 116 lb (52.6 kg) IBW/kg (Calculated) : 40.9  Vital Signs: Temp: 98.5 F (36.9 C) (12/18 1300) Temp Source: Oral (12/18 1300) BP: 125/83 (12/18 1300) Pulse Rate: 96 (12/18 1300)  Labs:  Recent Labs  03/02/16 0821 03/03/16 0236 03/03/16 1314  HGB 11.3* 9.3*  --   HCT 36.8 29.4*  --   PLT 145* 124*  --   CREATININE 0.86 0.95  --   TROPONINI  --   --  0.07*    Estimated Creatinine Clearance: 29.5 mL/min (by C-G formula based on SCr of 0.95 mg/dL).   Medical History: Past Medical History:  Diagnosis Date  . Atrial fibrillation ([place])   . CARDIAC ARRHYTHMIA 01/08/2010  . COLONIC POLYPS, HX OF 10/09/2006  . COPD (chronic obstructive pulmonary disease) ([place])   . HYPERLIPIDEMIA 10/09/2006  . HYPERTENSION 10/09/2006  . HYPOTHYROIDISM 10/09/2006  . LASSITUDE 08/15/2009  . OSTEOPOROSIS 10/09/2006  . VERTIGO 08/07/2009  . Wears glasses   . Wears hearing aid    left    Medications:  Xarelto 20 mg daily   Assessment: 89 yoF on xarelto 20 mg daily prior to arrival for atrial fibrillation. Pharmacy consulted to assist with dosing.  Wt 52.8 kg, SCr 0.95, CrCl 30 - 40 ml/min.    Goal of Therapy:  Monitor platelets by anticoagulation protocol: Yes   Plan: - Adjust Xarelto dose to 15 mg daily with CrCl < 50 ml/min  - CBC every 72 hours    Vincenza Hews, PharmD, BCPS 03/03/2016, 4:17 PM

## 2016-03-03 NOTE — Progress Notes (Signed)
[facility] PROGRESS NOTE  Christine Perez [id number] DOB: [birth year] DOA: 03/02/2016   PCP: Nyoka Cowden, MD     Assessment/Plan: Principal Problem:   CHF exacerbation ([place]) Active Problems:   Essential hypertension   Atrial fibrillation ([place])   Chronic anticoagulation   COPD with acute exacerbation ([place])   Hypertensive urgency   Pleural effusion   SSS (sick sinus syndrome) ([facility])   CHF (congestive heart failure) ([place])    82 y.o. female with significant history of chronic atrial fib status post pacemaker, on chronic anticoagulation, which Xarelto; hypertension, hypothyroidism, dyslipidemia and history of COPD.  Patient presents acutely to the ER secondary to worsening shortness of breath that started this morning around 3 AM that woke her up from sleep. Of note, patient and family members are giving history together. Patient has been c/o of fatigue and malaise for the last week or so, eating a lot of canned soups lately as well.    She recently saw her Cardiologist on 02/26/16,    Upon arrival to the ED, she was acutely short of breath, wheezing. The ER gave her DuoNeb treatment 1, as well as Lasix 20 mg IV 1 and started on her nitroglycerin drip secondary to hypertensive urgency.    Assessment and plan: 1.Congestive heart failure, suspect due to dietary indiscretions with salt intake, last echo 5/15 showed ef 0000000 w/ diastolic dysfunction due to atrial fib  Telemetry shows atrial fibrillation repeat 2-D echo pending - continue lasix 40iv bid - strict I/os - low salt diet discussed w/ pt and family - heart failure education per protocol    2. Hypertensive urgency - initial sbp 210s, may have exacerbated chf Discontinue nitroglycerin, decrease dose of Cozaar as blood pressure is now soft - nml bp at cardiology visit 02/26/16 - bp after turning off ntg gtt - 130/77  Continue beta blocker  3. Copd exacerbation - possibly, presented w/  wheezing, but could have been cardiac asthma as well. - continue scheduled duonebs (xopenex/impratropium) for now, no signs of coughing/sputum production, afebrile - will hold off steroids/abx for now.  4. Afib, chronic, w/ hx of ppm and on chronic anticogulation w/ Xarelto - with noted more frequent falls recently,  - consider stopping Xarelto if more frequent falls. - continue amiodarone 200/qd  5. Sick Sinus Syndrome, sp PPM - recently saw her cardiologist at [facility], 02/26/16 - bp normaltensive there, 130/90  6. Coronary artery disease - no c/o of angina currently, Continue to follow cardiac enzymes  7.  MI, old - inferior, per imaging,  no cath/PCI  8. Frequent falls - will ask PT/OT to eval in am.  9. Hypothyroidism TSH normal - continue same synthroid for now.  10. Hypokalemia-replete   DVT prophylaxsis Xarelto   Code Status:  Full code   Family Communication: Discussed in detail with the patient, all imaging results, lab results explained to the patient   Disposition Plan:   1-2 days     Consultants:  Cardiology  Procedures:      Antibiotics: Anti-infectives    None         HPI/Subjective:   less short of breath than yesterday  Objective: Vitals:   03/02/16 2100 03/03/16 0500 03/03/16 0757 03/03/16 1005  BP: 116/67 124/80  127/81  Pulse: 95 86  85  Resp: (!) 21 18  (!) 27  Temp: 98.6 F (37 C) 98.6 F (37 C)    TempSrc:      SpO2:  96% 98% 97% 100%  Weight:  52.6 kg (116 lb)    Height:  4\' 10"  (1.473 m)      Intake/Output Summary (Last 24 hours) at 03/03/16 1249 Last data filed at 03/03/16 1155  Gross per 24 hour  Intake              345 ml  Output             1900 ml  Net            -1555 ml    Exam:   General:  Appears calm and comfortable, pleasant, NAD, AAOx3, so pleasant  Eyes: PERRL, normal lids, irises & conjunctiva  ENT: grossly normal hearing, lips & tongue, mmm  Neck: +jvp 7cm  Cardiovascular: irreg  irreg, no m/r/g. 1+ bilat le edema, legs nttp.    Telemetry:  afib,rate control  Respiratory: dull & diminished at bases, crackles to mid lung fields.  Abdomen: soft, ntnd  Skin: no rash or induration seen on limited exam  Musculoskeletal: grossly normal tone BUE/BLE  Psychiatric: grossly normal mood and affect, speech fluent and appropriate  Neurologic: grossly non-focal.    Data Reviewed: I have personally reviewed following labs and imaging studies  Micro Results No results found for this or any previous visit (from the past 240 hour(s)).  Radiology Reports Dg Chest Portable 1 View  Result Date: 03/02/2016 CLINICAL DATA:  Shortness of Breath EXAM: PORTABLE CHEST 1 VIEW COMPARISON:  01/12/2015 FINDINGS: Cardiomegaly with vascular congestion. Increasing interstitial prominence likely reflects early interstitial edema. Left pacer is unchanged. Small bilateral pleural effusions, left greater than right. IMPRESSION: Cardiomegaly with mild edema/ CHF. Small bilateral effusions. Electronically Signed   By: Rolm Baptise M.D.   On: 03/02/2016 09:33     CBC  Recent Labs Lab 03/02/16 0821 03/03/16 0236  WBC 8.9 4.3  HGB 11.3* 9.3*  HCT 36.8 29.4*  PLT 145* 124*  MCV 99.2 97.0  MCH 30.5 30.7  MCHC 30.7 31.6  RDW 16.0* 15.9*  LYMPHSABS 0.7  --   MONOABS 0.7  --   EOSABS 0.0  --   BASOSABS 0.0  --     Chemistries   Recent Labs Lab 03/02/16 0821 03/03/16 0236  NA 142 140  K 3.8 3.0*  CL 105 100*  CO2 26 31  GLUCOSE 159* 85  BUN 13 12  CREATININE 0.86 0.95  CALCIUM 10.0 9.2   ------------------------------------------------------------------------------------------------------------------ estimated creatinine clearance is 29.5 mL/min (by C-G formula based on SCr of 0.95 mg/dL). ------------------------------------------------------------------------------------------------------------------ No results for input(s): HGBA1C in the last 72  hours. ------------------------------------------------------------------------------------------------------------------ No results for input(s): CHOL, HDL, LDLCALC, TRIG, CHOLHDL, LDLDIRECT in the last 72 hours. ------------------------------------------------------------------------------------------------------------------  Recent Labs  03/02/16 1333  TSH 1.591   ------------------------------------------------------------------------------------------------------------------ No results for input(s): VITAMINB12, FOLATE, FERRITIN, TIBC, IRON, RETICCTPCT in the last 72 hours.  Coagulation profile No results for input(s): INR, PROTIME in the last 168 hours.  No results for input(s): DDIMER in the last 72 hours.  Cardiac Enzymes No results for input(s): CKMB, TROPONINI, MYOGLOBIN in the last 168 hours.  Invalid input(s): CK ------------------------------------------------------------------------------------------------------------------ Invalid input(s): POCBNP   CBG: No results for input(s): GLUCAP in the last 168 hours.     Studies: Dg Chest Portable 1 View  Result Date: 03/02/2016 CLINICAL DATA:  Shortness of Breath EXAM: PORTABLE CHEST 1 VIEW COMPARISON:  01/12/2015 FINDINGS: Cardiomegaly with vascular congestion. Increasing interstitial prominence likely reflects early interstitial edema. Left pacer is unchanged. Small bilateral pleural effusions,  left greater than right. IMPRESSION: Cardiomegaly with mild edema/ CHF. Small bilateral effusions. Electronically Signed   By: Rolm Baptise M.D.   On: 03/02/2016 09:33      No results found for: HGBA1C Lab Results  Component Value Date   LDLCALC 77 09/04/2015   CREATININE 0.95 03/03/2016       Scheduled Meds: . amiodarone  100 mg Oral QHS  . calcium-vitamin D  1 tablet Oral BID  . furosemide  40 mg Intravenous BID  . levothyroxine  75 mcg Oral QAC breakfast  . losartan  100 mg Oral Daily  . metoprolol succinate   50 mg Oral Daily  . multivitamin with minerals  1 tablet Oral Daily  . rivaroxaban  20 mg Oral Q supper  . sodium chloride flush  3 mL Intravenous Q12H  . vitamin C  500 mg Oral Daily   Continuous Infusions:   LOS: 1 day    Time spent: >30 MINS    [facility]  [facility] Pager [phone]. If 7PM-7AM, please contact night-coverage at [email or web address], password [facility] 03/03/2016, 12:49 PM  LOS: 1 day

## 2016-03-04 DIAGNOSIS — J441 Chronic obstructive pulmonary disease with (acute) exacerbation: Secondary | ICD-10-CM

## 2016-03-04 DIAGNOSIS — I5033 Acute on chronic diastolic (congestive) heart failure: Secondary | ICD-10-CM

## 2016-03-04 DIAGNOSIS — I5021 Acute systolic (congestive) heart failure: Secondary | ICD-10-CM

## 2016-03-04 LAB — COMPREHENSIVE METABOLIC PANEL
ALBUMIN: 2.7 g/dL — AB (ref 3.5–5.0)
ALT: 21 U/L (ref 14–54)
ANION GAP: 9 (ref 5–15)
AST: 31 U/L (ref 15–41)
Alkaline Phosphatase: 95 U/L (ref 38–126)
BILIRUBIN TOTAL: 0.7 mg/dL (ref 0.3–1.2)
BUN: 14 mg/dL (ref 6–20)
CO2: 31 mmol/L (ref 22–32)
Calcium: 9.6 mg/dL (ref 8.9–10.3)
Chloride: 100 mmol/L — ABNORMAL LOW (ref 101–111)
Creatinine, Ser: 0.94 mg/dL (ref 0.44–1.00)
GFR, EST NON AFRICAN AMERICAN: 53 mL/min — AB (ref >60)
GLUCOSE: 98 mg/dL (ref 65–99)
POTASSIUM: 4 mmol/L (ref 3.5–5.1)
Sodium: 140 mmol/L (ref 135–145)
TOTAL PROTEIN: 5.4 g/dL — AB (ref 6.5–8.1)

## 2016-03-04 LAB — TROPONIN I: TROPONIN I: 0.06 ng/mL — AB (ref <0.03)

## 2016-03-04 MED ORDER — FUROSEMIDE 40 MG PO TABS: 40.0000 mg | ORAL_TABLET | Freq: Every day | ORAL | 0 refills | Status: DC

## 2016-03-04 MED ORDER — RIVAROXABAN 15 MG PO TABS: 15.0000 mg | ORAL_TABLET | Freq: Every day | ORAL | 1 refills | Status: DC

## 2016-03-04 MED ORDER — POTASSIUM CHLORIDE ER 20 MEQ PO TBCR: 20.0000 meq | EXTENDED_RELEASE_TABLET | Freq: Two times a day (BID) | ORAL | 1 refills | Status: DC

## 2016-03-04 NOTE — Discharge Summary (Addendum)
Physician Discharge Summary  Christine Perez MRN: [id number] DOB/AGE: 1926-02-10 82 y.o.  PCP: Nyoka Cowden, MD   Admit date: 03/02/2016 Discharge date: 03/04/2016  Discharge Diagnoses:    Principal Problem:   CHF exacerbation [facility]) Active Problems:   Essential hypertension   Atrial fibrillation (HCC)   Chronic anticoagulation   COPD with acute exacerbation (HCC)   Hypertensive urgency   Pleural effusion   SSS (sick sinus syndrome) (HCC)   CHF (congestive heart failure) ([place])    Follow-up recommendations Follow-up with PCP in 3-5 days , including all  additional recommended appointments as below Follow-up CBC, CMP in 3-5 days Patient will be set up with cardiology outpatient for acute on chronic diastolic heart failure      Current Discharge Medication List    START taking these medications   Details  furosemide (LASIX) 40 MG tablet Take 1 tablet (40 mg total) by mouth daily. Qty: 30 tablet, Refills: 0    potassium chloride 20 MEQ TBCR Take 20 mEq by mouth 2 (two) times daily. Qty: 60 tablet, Refills: 1      CONTINUE these medications which have CHANGED   Details  Rivaroxaban (XARELTO) 15 MG TABS tablet Take 1 tablet (15 mg total) by mouth daily with supper. Qty: 30 tablet, Refills: 1      CONTINUE these medications which have NOT CHANGED   Details  amiodarone (PACERONE) 400 MG tablet Take 100 mg by mouth at bedtime.  Refills: 2    calcium-vitamin D (OSCAL WITH D) 500-200 MG-UNIT per tablet Take 1 tablet by mouth 2 (two) times daily.     ipratropium (ATROVENT HFA) 17 MCG/ACT inhaler Inhale 2 puffs into the lungs every 4 (four) hours as needed for wheezing. Qty: 1 Inhaler, Refills: 12    losartan (COZAAR) 100 MG tablet TAKE ONE TABLET BY MOUTH ONCE DAILY Qty: 90 tablet, Refills: 1    metoprolol succinate (TOPROL-XL) 50 MG 24 hr tablet Take 50 mg by mouth daily. Take with or immediately following a meal.    Multiple Vitamin (MULTIVITAMIN)  tablet Take 1 tablet by mouth daily.    SYNTHROID 75 MCG tablet TAKE ONE TABLET BY MOUTH ONCE DAILY Qty: 90 tablet, Refills: 1    tiZANidine (ZANAFLEX) 2 MG tablet TAKE ONE TABLET BY MOUTH EVERY 12 HOURS AS NEEDED FOR MUSCLE SPASM Qty: 20 tablet, Refills: 0    traMADol (ULTRAM) 50 MG tablet Take 2 tablets (100 mg total) by mouth every 6 (six) hours as needed (pain). Qty: 60 tablet, Refills: 0    vitamin C (ASCORBIC ACID) 500 MG tablet Take 500 mg by mouth daily.    zolpidem (AMBIEN) 5 MG tablet Take 1 tablet (5 mg total) by mouth at bedtime. Qty: 30 tablet, Refills: 5         Discharge Condition: Stable Discharge Instructions Get Medicines reviewed and adjusted: Please take all your medications with you for your next visit with your Primary MD  Please request your Primary MD to go over all hospital tests and procedure/radiological results at the follow up, please ask your Primary MD to get all Hospital records sent to his/her office.  If you experience worsening of your admission symptoms, develop shortness of breath, life threatening emergency, suicidal or homicidal thoughts you must seek medical attention immediately by calling 911 or calling your MD immediately if symptoms less severe.  You must read complete instructions/literature along with all the possible adverse reactions/side effects for all the Medicines you take and that have  been prescribed to you. Take any new Medicines after you have completely understood and accpet all the possible adverse reactions/side effects.   Do not drive when taking Pain medications.   Do not take more than prescribed Pain, Sleep and Anxiety Medications  Special Instructions: If you have smoked or chewed Tobacco in the last 2 yrs please stop smoking, stop any regular Alcohol and or any Recreational drug use.  Wear Seat belts while driving.  Please note  You were cared for by a hospitalist during your hospital stay. Once you are  discharged, your primary care physician will handle any further medical issues. Please note that NO REFILLS for any discharge medications will be authorized once you are discharged, as it is imperative that you return to your primary care physician (or establish a relationship with a primary care physician if you do not have one) for your aftercare needs so that they can reassess your need for medications and monitor your lab values.     Allergies  Allergen Reactions  . Codeine Shortness Of Breath    Difficulty breathing      Disposition: Home with home health   Consults:  None    Significant Diagnostic Studies:  Dg Chest Portable 1 View  Result Date: 03/02/2016 CLINICAL DATA:  Shortness of Breath EXAM: PORTABLE CHEST 1 VIEW COMPARISON:  01/12/2015 FINDINGS: Cardiomegaly with vascular congestion. Increasing interstitial prominence likely reflects early interstitial edema. Left pacer is unchanged. Small bilateral pleural effusions, left greater than right. IMPRESSION: Cardiomegaly with mild edema/ CHF. Small bilateral effusions. Electronically Signed   By: Rolm Baptise M.D.   On: 03/02/2016 09:33    2-D echo LV EF: 60% -   65%  ------------------------------------------------------------------- Indications:      CHF - 428.0.  ------------------------------------------------------------------- History:   PMH:   Atrial fibrillation.  Chronic obstructive pulmonary disease.  Risk factors:  Hypertension.  ------------------------------------------------------------------- Study Conclusions  - Left ventricle: The cavity size was normal. Systolic function was   normal. The estimated ejection fraction was in the range of 60%   to 65%. Wall motion was normal; there were no regional wall   motion abnormalities. Left ventricular diastolic function   parameters were normal. - Mitral valve: There was mild regurgitation. - Left atrium: The atrium was severely dilated. - Right  ventricle: The cavity size was mildly dilated. Wall   thickness was normal. - Right atrium: The atrium was severely dilated. - Tricuspid valve: There was moderate regurgitation. - Pulmonary arteries: PA peak pressure: 34 mm Hg (S).   Filed Weights   03/03/16 0500 03/04/16 0500  Weight: 52.6 kg (116 lb) 54.5 kg (120 lb 3.2 oz)       Labs: Results for orders placed or performed during the hospital encounter of 03/02/16 (from the past 48 hour(s))  TSH     Status: None   Collection Time: 03/02/16  1:33 PM  Result Value Ref Range   TSH 1.591 0.350 - 4.500 uIU/mL    Comment: Performed by a 3rd Generation assay with a functional sensitivity of <=0.01 uIU/mL.  Basic metabolic panel     Status: Abnormal   Collection Time: 03/03/16  2:36 AM  Result Value Ref Range   Sodium 140 135 - 145 mmol/L   Potassium 3.0 (L) 3.5 - 5.1 mmol/L    Comment: DELTA CHECK NOTED   Chloride 100 (L) 101 - 111 mmol/L   CO2 31 22 - 32 mmol/L   Glucose, Bld 85 65 - 99 mg/dL  BUN 12 6 - 20 mg/dL   Creatinine, Ser 0.95 0.44 - 1.00 mg/dL   Calcium 9.2 8.9 - 10.3 mg/dL   GFR calc non Af Amer 52 (L) >60 mL/min   GFR calc Af Amer >60 >60 mL/min    Comment: (NOTE) The eGFR has been calculated using the CKD EPI equation. This calculation has not been validated in all clinical situations. eGFR's persistently <60 mL/min signify possible Chronic Kidney Disease.    Anion gap 9 5 - 15  CBC     Status: Abnormal   Collection Time: 03/03/16  2:36 AM  Result Value Ref Range   WBC 4.3 4.0 - 10.5 K/uL   RBC 3.03 (L) 3.87 - 5.11 MIL/uL   Hemoglobin 9.3 (L) 12.0 - 15.0 g/dL   HCT 29.4 (L) 36.0 - 46.0 %   MCV 97.0 78.0 - 100.0 fL   MCH 30.7 26.0 - 34.0 pg   MCHC 31.6 30.0 - 36.0 g/dL   RDW 15.9 (H) 11.5 - 15.5 %   Platelets 124 (L) 150 - 400 K/uL  Troponin I     Status: Abnormal   Collection Time: 03/03/16  1:14 PM  Result Value Ref Range   Troponin I 0.07 (HH) <0.03 ng/mL    Comment: CRITICAL RESULT CALLED TO,  READ BACK BY AND VERIFIED WITH: E.MOSQUEDA,RN 03/03/16 1406 BY BSLADE   Comprehensive metabolic panel     Status: Abnormal   Collection Time: 03/04/16  3:41 AM  Result Value Ref Range   Sodium 140 135 - 145 mmol/L   Potassium 4.0 3.5 - 5.1 mmol/L    Comment: DELTA CHECK NOTED   Chloride 100 (L) 101 - 111 mmol/L   CO2 31 22 - 32 mmol/L   Glucose, Bld 98 65 - 99 mg/dL   BUN 14 6 - 20 mg/dL   Creatinine, Ser 0.94 0.44 - 1.00 mg/dL   Calcium 9.6 8.9 - 10.3 mg/dL   Total Protein 5.4 (L) 6.5 - 8.1 g/dL   Albumin 2.7 (L) 3.5 - 5.0 g/dL   AST 31 15 - 41 U/L   ALT 21 14 - 54 U/L   Alkaline Phosphatase 95 38 - 126 U/L   Total Bilirubin 0.7 0.3 - 1.2 mg/dL   GFR calc non Af Amer 53 (L) >60 mL/min   GFR calc Af Amer >60 >60 mL/min    Comment: (NOTE) The eGFR has been calculated using the CKD EPI equation. This calculation has not been validated in all clinical situations. eGFR's persistently <60 mL/min signify possible Chronic Kidney Disease.    Anion gap 9 5 - 15     Lipid Panel     Component Value Date/Time   CHOL 160 09/04/2015 0947   TRIG 91.0 09/04/2015 0947   HDL 65.30 09/04/2015 0947   CHOLHDL 2 09/04/2015 0947   VLDL 18.2 09/04/2015 0947   LDLCALC 77 09/04/2015 0947   LDLDIRECT 133.2 12/21/2012 1207         HPI :  82 y.o.femalewith significant history of atrial fibrillation/ atrial flutter. She underwent successful cardioversion on 08/09/13.she went  back in atrial flutter/fibrillation on 09/05/13 , now has chronic atrial fib status post pacemaker maintained on rate control strategy, on chronic anticoagulation, which Xarelto; hypertension, hypothyroidism, dyslipidemia and history of COPD. Patient presents acutely to the ER secondary to worsening shortness of breath that started this morning around 3 AM that woke her up from sleep. Of note, patient and family members are giving history together. Patient has  been c/o of fatigue and malaise for the last week or so, eating  a lot of canned soups lately as well.   She recently saw her PCP on 02/26/16,    Upon arrival to the ED, she was acutely short of breath, wheezing. The ER gave her DuoNeb treatment 1, as well as Lasix 20 mg IV 1 and started on her nitroglycerin drip secondary to hypertensive urgency.  Initial  EKG showed A. fib rate controlled  , patient remained in normal sinus rhythm during this hospitalization  HOSPITAL COURSE:   1.Acute on chronic diastolic Congestive heart failure, suspect due to dietary indiscretions with salt intake, repeat echo as above  Telemetry shows normal sinus rhythm Patient diuresed with lasix 40iv bid. Switch to oral Lasix 40 mg a day   Now significantly improved since admission   troponin 0.07, but no regional wall motion abnormalities on 2-D echo, patient has had no CP  2. Hypertensive urgency - initial sbp 210s,  Initially placed on nitroglycerin drip, continue Cozaar, beta blocker  contacted cards office for outpatient appointment    3. Copd exacerbation - possibly, presented w/ wheezing, but could have been cardiac asthma as well. - Received scheduled duonebs (xopenex/impratropium) , no signs of coughing/sputum production, afebrile Did not receive steroids   4. Afib, chronic, w/ hx of ppm and on chronic anticogulation w/ Xarelto - with noted more frequent falls recently,  - consider stopping Xarelto if more frequent falls. Dose adjusted based on renal function per pharmacy - continue amiodarone 200/qd We'll set up outpatient cardiology follow-up, has previously seen Dr. Candee Furbish  in 2015    5. Sick Sinus Syndrome, sp PPM - recently saw her cardiologist at [facility], 02/26/16 - bp normaltensive there, 130/90  6. Coronary artery disease - no c/o of angina currently, Continue to follow cardiac enzymes  7. MI, old - inferior, per imaging, no cath/PCI  8. Frequent falls - will ask PT/OT to eval in am.  9. Hypothyroidism TSH normal - continue  same synthroid for now.  10. Hypokalemia-repleted     Discharge Exam:   Blood pressure 123/61, pulse 75, temperature 98.2 F (36.8 C), temperature source Oral, resp. rate 16, height 4' 10"  (1.473 m), weight 54.5 kg (120 lb 3.2 oz), SpO2 96 %.  General:Appears calm and comfortable, pleasant, NAD, AAOx3, so pleasant  Eyes:PERRL, normal lids, irises &conjunctiva  DIY:MEBRAXE normal hearing, lips &tongue, mmm  Neck:+jvp 7cm  Cardiovascular: Regular rate and rhythm,no m/r/g. 1+ bilat le edema, legs nttp.   Telemetry: afib,rate control  Respiratory:dull & diminished at bases, crackles to mid lung fields.      SignedReyne Dumas 03/04/2016, 9:35 AM        Time spent >45 mins

## 2016-03-04 NOTE — Evaluation (Signed)
Occupational Therapy Evaluation and Discharge Patient Details Name: Christine Perez MRN: [id number] DOB: 1926-03-07 Today's Date: 03/04/2016    History of Present Illness Patient is an 82 yo female admitted 03/02/16 with SOB.  Patient with CHF exacerbation, COPD, and hypertensive urgency.    PMH:  Afib, pacemaker, HTN, HLD, COPD, CAD, MI, HOH, vertigo, frequent falls since October 2017.   Clinical Impression   This 82 yo female admitted with above presents to acute OT at a S level and will have this at home. No further OT needs, we will sign off.    Follow Up Recommendations  No OT follow up;Supervision/Assistance - 24 hour    Equipment Recommendations  None recommended by OT       Precautions / Restrictions Precautions Precautions: Fall Precaution Comments: Per patient, falls are occurring in bathroom/bathtub.  Daughter is putting in walk-in shower with seat and hand-held shower, and grab bars in bathroom.(per pt it should be put in this week--but not totally sure due to the up coming holiday) Restrictions Weight Bearing Restrictions: No      Mobility Bed Mobility Overal bed mobility: Modified Independent                Transfers Overall transfer level: Needs assistance Equipment used: Rolling walker (2 wheeled) Transfers: Sit to/from Stand Sit to Stand: Supervision         General transfer comment: Verbal cues for hand placement.    Balance Overall balance assessment: Needs assistance;History of Falls Sitting-balance support: No upper extremity supported;Feet supported Sitting balance-Leahy Scale: Good     Standing balance support: Bilateral upper extremity supported Standing balance-Leahy Scale: Poor Standing balance comment: feels safer when using RW right now                            ADL                                         General ADL Comments: Pt at an overall S level for basic ADLs and says she plans on  sponge bathing until the walk in shower gets put in               Pertinent Vitals/Pain Pain Assessment: No/denies pain     Hand Dominance Right   Extremity/Trunk Assessment Upper Extremity Assessment Upper Extremity Assessment: Overall WFL for tasks assessed           Communication Communication Communication: HOH   Cognition Arousal/Alertness: Awake/alert Behavior During Therapy: WFL for tasks assessed/performed Overall Cognitive Status: Within Functional Limits for tasks assessed                                Home Living Family/patient expects to be discharged to:: Private residence Living Arrangements: Children Available Help at Discharge: Family;Available 24 hours/day (dtr works from home) Type of Home: House Home Access: Stairs to enter Technical brewer of Steps: 3 Entrance Stairs-Rails: [place]: One level     Bathroom Shower/Tub: Tub/shower unit (soon to get a walk in shower with seat)   Bathroom Toilet: [place]: Environmental consultant - 2 wheels;Cane - single point          Prior Functioning/Environment Level of Independence: Independent with assistive device(s)  Comments: Recently using RW for safety.  Has been bathing independently - may need assist due to falls.  Patient drives.             OT Goals(Current goals can be found in the care plan section) Acute Rehab OT Goals Patient Stated Goal: home today   OT Frequency:                End of Session Equipment Utilized During Treatment: Rolling walker  Activity Tolerance: Patient tolerated treatment well Patient left: in bed;with call bell/phone within reach;with bed alarm set   Time: [id number] OT Time Calculation (min): 20 min Charges:  OT General Charges $OT Visit: 1 Procedure OT Evaluation $OT Eval Moderate Complexity: 1 Procedure  Almon Register [id number] 03/04/2016, 1:46 PM

## 2016-03-04 NOTE — Care Management Note (Addendum)
Case Management Note  Patient Details  Name: Christine Perez MRN: [id number] Date of Birth: 30-Apr-1925  Subjective/Objective:    Pt presented for CHF exacerbation. Pt is from home and daughter lives wit her. PT/ OT consulted for recommendations-stated [facility].                 Action/Plan: CM did provide pt with agency list for [place]. Pt is undecided at this time in regards to if she wants therapy or not. CM did ask if family would be here. CM to speak with daughter around 4:00 pm in regards to [facility].    Expected Discharge Date:                  Expected Discharge Plan:  [place]  In-House Referral:  NA  Discharge planning Services  CM Consult  Post Acute Care Choice:  Home Health Choice offered to:  Patient, Adult Children  DME Arranged:  N/A DME Agency:  NA  HH Arranged:   PT HH Agency:   [facility]  Status of Service:  Completed.  If discussed at [place] of Stay Meetings, dates discussed:    Additional Comments: 03-04-16 [address] Jacqlyn Krauss, RN,BSN [phone] CM did speak with pt in regards to disposition needs. Pt stated ok to call daughter and CM did. Daughter stated to use [facility] in regards to [place] for Physical Therapy. Plan will be for d/c home today. CM did make referral and SOC to begin within 24-48 hours post d/c. No further needs at this time.  Bethena Roys, RN 03/04/2016, 1:52 PM

## 2016-03-04 NOTE — Discharge Instructions (Addendum)
Heart Failure Heart failure is a condition in which the heart has trouble pumping blood because it has become weak or stiff. This means that the heart does not pump blood efficiently for the body to work well. For some people with heart failure, fluid may back up into the lungs and there may be swelling (edema) in the lower legs. Heart failure is usually a long-term (chronic) condition. It is important for you to take good care of yourself and follow the treatment plan from your health care provider. What are the causes? This condition is caused by some health problems, including:  High blood pressure (hypertension). Hypertension causes the heart muscle to work harder than normal. High blood pressure eventually causes the heart to become stiff and weak.  Coronary artery disease (CAD). CAD is the buildup of cholesterol and fat (plaques) in the arteries of the heart.  Heart attack (myocardial infarction). Injured tissue, which is caused by the heart attack, does not contract as well and the heart's ability to pump blood is weakened.  Abnormal heart valves. When the heart valves do not open and close properly, the heart muscle must pump harder to keep the blood flowing.  Heart muscle disease (cardiomyopathy or myocarditis). Heart muscle disease is damage to the heart muscle from a variety of causes, such as drug or alcohol abuse, infections, or unknown causes. These can increase the risk of heart failure.  Lung disease. When the lungs do not work properly, the heart must work harder. What increases the risk? Risk of heart failure increases as a person ages. This condition is also more likely to develop in people who:  Are overweight.  Are female.  Smoke or chew tobacco.  Abuse alcohol or illegal drugs.  Have taken medicines that can damage the heart, such as chemotherapy drugs.  Have diabetes.  High blood sugar (glucose) is associated with high fat (lipid) levels in the blood.  Diabetes  can also damage tiny blood vessels that carry nutrients to the heart muscle.  Have abnormal heart rhythms.  Have thyroid problems.  Have low blood counts (anemia). What are the signs or symptoms? Symptoms of this condition include:  Shortness of breath with activity, such as when climbing stairs.  Persistent cough.  Swelling of the feet, ankles, legs, or abdomen.  Unexplained weight gain.  Difficulty breathing when lying flat (orthopnea).  Waking from sleep because of the need to sit up and get more air.  Rapid heartbeat.  Fatigue and loss of energy.  Feeling light-headed, dizzy, or close to fainting.  Loss of appetite.  Nausea.  Increased urination during the night (nocturia).  Confusion. How is this diagnosed? This condition is diagnosed based on:  Medical history, symptoms, and a physical exam.  Diagnostic tests, which may include:  Echocardiogram.  Electrocardiogram (ECG).  Chest X-ray.  Blood tests.  Exercise stress test.  Radionuclide scans.  Cardiac catheterization and angiogram. How is this treated? Treatment for this condition is aimed at managing the symptoms of heart failure. Medicines, behavioral changes, or other treatments may be necessary to treat heart failure. Medicines  These may include:  Angiotensin-converting enzyme (ACE) inhibitors. This type of medicine blocks the effects of a blood protein called angiotensin-converting enzyme. ACE inhibitors relax (dilate) the blood vessels and help to lower blood pressure.  Angiotensin receptor blockers (ARBs). This type of medicine blocks the actions of a blood protein called angiotensin. ARBs dilate the blood vessels and help to lower blood pressure.  Water pills (diuretics). Diuretics  cause the kidneys to remove salt and water from the blood. The extra fluid is removed through urination, leaving a lower volume of blood that the heart has to pump.  Beta blockers. These improve heart muscle  strength and they prevent the heart from beating too quickly.  Digoxin. This increases the force of the heartbeat. Healthy behavior changes  These may include:  Reaching and maintaining a healthy weight.  Stopping smoking or chewing tobacco.  Eating heart-healthy foods.  Limiting or avoiding alcohol.  Stopping use of street drugs (illegal drugs).  Physical activity. Other treatments  These may include:  Surgery to open blocked coronary arteries or repair damaged heart valves.  Placement of a biventricular pacemaker to improve heart muscle function (cardiac resynchronization therapy). This device paces both the right ventricle and left ventricle.  Placement of a device to treat serious abnormal heart rhythms (implantable cardioverter defibrillator, or ICD).  Placement of a device to improve the pumping ability of the heart (left ventricular assist device, or LVAD).  Heart transplant. This can cure heart failure, and it is considered for certain patients who do not improve with other therapies. Follow these instructions at home: Medicines  Take over-the-counter and prescription medicines only as told by your health care provider. Medicines are important in reducing the workload of your heart, slowing the progression of heart failure, and improving your symptoms.  Do not stop taking your medicine unless your health care provider told you to do that.  Do not skip any dose of medicine.  Refill your prescriptions before you run out of medicine. You need your medicines every day. Eating and drinking  Eat heart-healthy foods. Talk with a dietitian to make an eating plan that is right for you.  Choose foods that contain no trans fat and are low in saturated fat and cholesterol. Healthy choices include fresh or frozen fruits and vegetables, fish, lean meats, legumes, fat-free or low-fat dairy products, and whole-grain or high-fiber foods.  Limit salt (sodium) if directed by your  health care provider. Sodium restriction may reduce symptoms of heart failure. Ask a dietitian to recommend heart-healthy seasonings.  Use healthy cooking methods instead of frying. Healthy methods include roasting, grilling, broiling, baking, poaching, steaming, and stir-frying.  Limit your fluid intake if directed by your health care provider. Fluid restriction may reduce symptoms of heart failure. Lifestyle  Stop smoking or using chewing tobacco. Nicotine and tobacco can damage your heart and your blood vessels. Do not use nicotine gum or patches before talking to your health care provider.  Limit alcohol intake to no more than 1 drink per day for non-pregnant women and 2 drinks per day for men. One drink equals 12 oz of beer, 5 oz of wine, or 1 oz of hard liquor.  Drinking more than that is harmful to your heart. Tell your health care provider if you drink alcohol several times a week.  Talk with your health care provider about whether any level of alcohol use is safe for you.  If your heart has already been damaged by alcohol or you have severe heart failure, drinking alcohol should be stopped completely.  Stop use of illegal drugs.  Lose weight if directed by your health care provider. Weight loss may reduce symptoms of heart failure.  Do moderate physical activity if directed by your health care provider. People who are elderly and people with severe heart failure should consult with a health care provider for physical activity recommendations. Monitor important information  Weigh  yourself every day. Keeping track of your weight daily helps you to notice excess fluid sooner.  Weigh yourself every morning after you urinate and before you eat breakfast.  Wear the same amount of clothing each time you weigh yourself.  Record your daily weight. Provide your health care provider with your weight record.  Monitor and record your blood pressure as told by your health care  provider.  Check your pulse as told by your health care provider. Dealing with extreme temperatures  If the weather is extremely hot:  Avoid vigorous physical activity.  Use air conditioning or fans or seek a cooler location.  Avoid caffeine and alcohol.  Wear loose-fitting, lightweight, and light-colored clothing.  If the weather is extremely cold:  Avoid vigorous physical activity.  Layer your clothes.  Wear mittens or gloves, a hat, and a scarf when you go outside.  Avoid alcohol. General instructions  Manage other health conditions such as hypertension, diabetes, thyroid disease, or abnormal heart rhythms as told by your health care provider.  Learn to manage stress. If you need help to do this, ask your health care provider.  Plan rest periods when fatigued.  Get ongoing education and support as needed.  Participate in or seek rehabilitation as needed to maintain or improve independence and quality of life.  Stay up to date with immunizations. Keeping current on pneumococcal and influenza immunizations is especially important to prevent respiratory infections.  Keep all follow-up visits as told by your health care provider. This is important. Contact a health care provider if:  You have a rapid weight gain.  You have increasing shortness of breath that is unusual for you.  You are unable to participate in your usual physical activities.  You tire easily.  You cough more than normal, especially with physical activity.  You have any swelling or more swelling in areas such as your hands, feet, ankles, or abdomen.  You are unable to sleep because it is hard to breathe.  You feel like your heart is beating quickly (palpitations).  You become dizzy or light-headed when you stand up. Get help right away if:  You have difficulty breathing.  You notice or your family notices a change in your awareness, such as having trouble staying awake or having difficulty  with concentration.  You have pain or discomfort in your chest.  You have an episode of fainting (syncope). This information is not intended to replace advice given to you by your health care provider. Make sure you discuss any questions you have with your health care provider. Document Released: 03/03/2005 Document Revised: 11/06/2015 Document Reviewed: 09/26/2015 Elsevier Interactive Patient Education  2017 [employer].   -------------------------------------------------------------------------------------------------------------------------------------------------------------------------------------   Information on my medicine - XARELTO (Rivaroxaban)  This medication education was reviewed with me or my healthcare representative as part of my discharge preparation.  The pharmacist that spoke with me during my hospital stay was:  Arty Baumgartner, [facility]  Why was Xarelto prescribed for you? Xarelto was prescribed for you to reduce the risk of a blood clot forming that can cause a stroke if you have a medical condition called atrial fibrillation (a type of irregular heartbeat).  What do you need to know about xarelto ? Take your Xarelto ONCE DAILY at the same time every day with your evening meal. If you have difficulty swallowing the tablet whole, you may crush it and mix in applesauce just prior to taking your dose.  Take Xarelto exactly as prescribed by your  doctor and DO NOT stop taking Xarelto without talking to the doctor who prescribed the medication.  Stopping without other stroke prevention medication to take the place of Xarelto may increase your risk of developing a clot that causes a stroke.  Refill your prescription before you run out.  After discharge, you should have regular check-up appointments with your healthcare provider that is prescribing your Xarelto.  In the future your dose may need to be changed if your kidney function or weight changes by a  significant amount.  What do you do if you miss a dose? If you are taking Xarelto ONCE DAILY and you miss a dose, take it as soon as you remember on the same day then continue your regularly scheduled once daily regimen the next day. Do not take two doses of Xarelto at the same time or on the same day.   Important Safety Information A possible side effect of Xarelto is bleeding. You should call your healthcare provider right away if you experience any of the following: ? Bleeding from an injury or your nose that does not stop. ? Unusual colored urine (red or dark brown) or unusual colored stools (red or black). ? Unusual bruising for unknown reasons. ? A serious fall or if you hit your head (even if there is no bleeding).  Some medicines may interact with Xarelto and might increase your risk of bleeding while on Xarelto. To help avoid this, consult your healthcare provider or pharmacist prior to using any new prescription or non-prescription medications, including herbals, vitamins, non-steroidal anti-inflammatory drugs (NSAIDs) and supplements.  This website has more information on Xarelto: [email or web address]  Document Released: 03/03/2005 Document Revised: 11/06/2015 Document Reviewed: 09/26/2015 Elsevier Interactive Patient Education  2017 [employer].   -------------------------------------------------------------------------------------------------------------------------------------------------------------------------------------

## 2016-03-06 ENCOUNTER — Telehealth: Payer: Self-pay

## 2016-03-06 NOTE — Telephone Encounter (Signed)
LMTCB for pt's daughter 

## 2016-03-07 NOTE — Telephone Encounter (Signed)
Pt daughter returned your call. Pt prefers to wait for Dr Raliegh Ip, she has an appt 03/18/2016.

## 2016-03-11 NOTE — Telephone Encounter (Signed)
LMTCB for daughter, need to complete TCM call

## 2016-03-12 ENCOUNTER — Other Ambulatory Visit: Payer: Self-pay | Admitting: Internal Medicine

## 2016-03-18 ENCOUNTER — Encounter: Payer: Self-pay | Admitting: Internal Medicine

## 2016-03-18 ENCOUNTER — Ambulatory Visit (INDEPENDENT_AMBULATORY_CARE_PROVIDER_SITE_OTHER): Payer: Medicare Other | Admitting: Internal Medicine

## 2016-03-18 VITALS — BP 108/62 | HR 94 | Temp 97.5°F | Ht <= 58 in | Wt 117.2 lb

## 2016-03-18 DIAGNOSIS — I10 Essential (primary) hypertension: Secondary | ICD-10-CM | POA: Diagnosis not present

## 2016-03-18 DIAGNOSIS — Z7901 Long term (current) use of anticoagulants: Secondary | ICD-10-CM

## 2016-03-18 DIAGNOSIS — I480 Paroxysmal atrial fibrillation: Secondary | ICD-10-CM | POA: Diagnosis not present

## 2016-03-18 DIAGNOSIS — I495 Sick sinus syndrome: Secondary | ICD-10-CM | POA: Diagnosis not present

## 2016-03-18 DIAGNOSIS — I5033 Acute on chronic diastolic (congestive) heart failure: Secondary | ICD-10-CM

## 2016-03-18 DIAGNOSIS — I5032 Chronic diastolic (congestive) heart failure: Secondary | ICD-10-CM

## 2016-03-18 MED ORDER — IPRATROPIUM BROMIDE HFA 17 MCG/ACT IN AERS: 2.0000 | INHALATION_SPRAY | RESPIRATORY_TRACT | 12 refills | Status: AC | PRN

## 2016-03-18 NOTE — Progress Notes (Signed)
Subjective:    Patient ID: Christine Perez, female    DOB: [birth year], 83 y.o.   MRN: [id number]  HPI  Admit date: 03/02/2016 Discharge date: 03/04/2016  Discharge Diagnoses:    Principal Problem:   CHF exacerbation [facility]) Active Problems:   Essential hypertension   Atrial fibrillation (HCC)   Chronic anticoagulation   COPD with acute exacerbation (HCC)   Hypertensive urgency   Pleural effusion   SSS (sick sinus syndrome) (HCC)   CHF (congestive heart failure) ([place])    Follow-up recommendations Follow-up with PCP in 3-5 days , including all  additional recommended appointments as below Follow-up CBC, CMP in 3-5 days Patient will be set up with cardiology outpatient for acute on chronic diastolic heart failure  83 year old patient who is seen today following a recent hospital discharge.  She has done fairly well, still complaining of some decreased energy level and mild DOE with activity.  She has some occasional postnasal drip and mucus production with cough.  She complains of a foul taste in her mouth.  She does describe some nocturnal wheezing.  She does have a history of diastolic heart failure.  She has been monitoring daily weights which have been stable.  Her peripheral edema has resolved  She has been followed by [facility] cardiology and now wishes to be followed locally  Past Medical History:  Diagnosis Date  . Atrial fibrillation ([place])   . CARDIAC ARRHYTHMIA 01/08/2010  . COLONIC POLYPS, HX OF 10/09/2006  . COPD (chronic obstructive pulmonary disease) ([place])   . HYPERLIPIDEMIA 10/09/2006  . HYPERTENSION 10/09/2006  . HYPOTHYROIDISM 10/09/2006  . LASSITUDE 08/15/2009  . OSTEOPOROSIS 10/09/2006  . VERTIGO 08/07/2009  . Wears glasses   . Wears hearing aid    left     Social History   Social History  . Marital status: Widowed    Spouse name: N/A  . Number of children: N/A  . Years of education: N/A   Occupational History  . Not on file.   Social History  Main Topics  . Smoking status: Never Smoker  . Smokeless tobacco: Never Used  . Alcohol use No  . Drug use: No  . Sexual activity: Not on file   Other Topics Concern  . Not on file   Social History Narrative  . No narrative on file    Past Surgical History:  Procedure Laterality Date  . ABDOMINAL HYSTERECTOMY    . APPENDECTOMY    . BREAST SURGERY     left - lumpectomy  . CARDIOVERSION N/A 08/09/2013   Procedure: CARDIOVERSION;  Surgeon: Candee Furbish, MD;  Location: [facility] ENDOSCOPY;  Service: Cardiovascular;  Laterality: N/A;  . CATARACT EXTRACTION    . CHOLECYSTECTOMY    . PACEMAKER PLACEMENT N/A 12/05/2013  . PARTIAL MASTECTOMY WITH NEEDLE LOCALIZATION Left 06/29/2012   Procedure: PARTIAL MASTECTOMY WITH NEEDLE LOCALIZATION;  Surgeon: Adin Hector, MD;  Location: [facility];  Service: General;  Laterality: Left;  . TONSILLECTOMY      No family history on file.  Allergies  Allergen Reactions  . Codeine Shortness Of Breath    Difficulty breathing    Current Outpatient Prescriptions on File Prior to Visit  Medication Sig Dispense Refill  . amiodarone (PACERONE) 400 MG tablet Take 100 mg by mouth at bedtime.   2  . calcium-vitamin D (OSCAL WITH D) 500-200 MG-UNIT per tablet Take 1 tablet by mouth 2 (two) times daily.     . furosemide (LASIX) 40 MG  tablet Take 1 tablet (40 mg total) by mouth daily. 30 tablet 0  . losartan (COZAAR) 100 MG tablet TAKE ONE TABLET BY MOUTH ONCE DAILY 90 tablet 1  . metoprolol succinate (TOPROL-XL) 50 MG 24 hr tablet Take 50 mg by mouth daily. Take with or immediately following a meal.    . Multiple Vitamin (MULTIVITAMIN) tablet Take 1 tablet by mouth daily.    . potassium chloride 20 MEQ TBCR Take 20 mEq by mouth 2 (two) times daily. 60 tablet 1  . Rivaroxaban (XARELTO) 15 MG TABS tablet Take 1 tablet (15 mg total) by mouth daily with supper. 30 tablet 1  . SYNTHROID 75 MCG tablet TAKE ONE TABLET BY MOUTH ONCE DAILY 90 tablet 1  .  vitamin C (ASCORBIC ACID) 500 MG tablet Take 500 mg by mouth daily.    Marland Kitchen zolpidem (AMBIEN) 5 MG tablet Take 1 tablet (5 mg total) by mouth at bedtime. (Patient taking differently: Take 5 mg by mouth at bedtime as needed for sleep. ) 30 tablet 5  . [DISCONTINUED] metoprolol tartrate (LOPRESSOR) 25 MG tablet Take 1 tablet (25 mg total) by mouth 2 (two) times daily. 60 tablet 5   No current facility-administered medications on file prior to visit.     BP 108/62 (BP Location: Left Arm, Patient Position: Sitting, Cuff Size: Normal)   Pulse 94   Temp 97.5 F (36.4 C) (Oral)   Ht 4\' 10"  (1.473 m)   Wt 117 lb 3.2 oz (53.2 kg)   SpO2 95%   BMI 24.49 kg/m      Review of Systems  Constitutional: Positive for activity change, appetite change and fatigue.  HENT: Positive for congestion and postnasal drip. Negative for dental problem, hearing loss, rhinorrhea, sinus pressure, sore throat and tinnitus.   Eyes: Negative for pain, discharge and visual disturbance.  Respiratory: Positive for cough, shortness of breath and wheezing.   Cardiovascular: Negative for chest pain, palpitations and leg swelling.  Gastrointestinal: Negative for abdominal distention, abdominal pain, blood in stool, constipation, diarrhea, nausea and vomiting.  Genitourinary: Negative for difficulty urinating, dysuria, flank pain, frequency, hematuria, pelvic pain, urgency, vaginal bleeding, vaginal discharge and vaginal pain.  Musculoskeletal: Negative for arthralgias, gait problem and joint swelling.  Skin: Negative for rash.  Neurological: Negative for dizziness, syncope, speech difficulty, weakness, numbness and headaches.  Hematological: Negative for adenopathy.  Psychiatric/Behavioral: Negative for agitation, behavioral problems and dysphoric mood. The patient is not nervous/anxious.        Objective:   Physical Exam  Constitutional: She is oriented to person, place, and time. She appears well-developed and  well-nourished.  HENT:  Head: Normocephalic.  Right Ear: External ear normal.  Left Ear: External ear normal.  Mouth/Throat: Oropharynx is clear and moist.  Eyes: Conjunctivae and EOM are normal. Pupils are equal, round, and reactive to light.  Neck: Normal range of motion. Neck supple. No thyromegaly present.  Cardiovascular: Normal rate, regular rhythm, normal heart sounds and intact distal pulses.   Pulmonary/Chest: Effort normal and breath sounds normal. No respiratory distress. She has no wheezes. She has no rales.  Kyphosis Chest is clear O2 saturation 95  Abdominal: Soft. Bowel sounds are normal. She exhibits no mass. There is no tenderness.  Musculoskeletal: Normal range of motion. She exhibits no edema.  Lymphadenopathy:    She has no cervical adenopathy.  Neurological: She is alert and oriented to person, place, and time.  Skin: Skin is warm and dry. No rash noted.  Psychiatric: She  has a normal mood and affect. Her behavior is normal.          Assessment & Plan:   Status post recent admission for decompensated diastolic heart failure History of hypertensive urgency.  Blood pressure low normal today Sick Sinus syndrome.  Continue anticoagulation Dyslipidemia History of COPD.  Continue when necessary Atrovent  Cardiology follow-up Recheck here in one month or as needed Continue restricted salt diet  We'll check the electrolytes.  Consider decrease furosemide dose  Nyoka Cowden

## 2016-03-18 NOTE — Patient Instructions (Signed)
Limit your sodium (Salt) intake  Notify of any significant weight gain of greater than 3 pounds  Cardiology follow-up as scheduled  Return here in 3 months for follow-up  Report any clinical worsening, such as shortness of breath, wheezing or significant swelling of the legs

## 2016-03-18 NOTE — Telephone Encounter (Signed)
TCM call not completed as pt's daughter would not return call. Pt is scheduled for hosp f/u today.

## 2016-03-18 NOTE — Progress Notes (Signed)
Pre visit review using our clinic review tool, if applicable. No additional management support is needed unless otherwise documented below in the visit note. 

## 2016-03-19 ENCOUNTER — Ambulatory Visit: Payer: Medicare Other | Admitting: Nurse Practitioner

## 2016-03-19 LAB — BASIC METABOLIC PANEL
BUN: 30 mg/dL — ABNORMAL HIGH (ref 6–23)
CALCIUM: 10.6 mg/dL — AB (ref 8.4–10.5)
CO2: 31 mEq/L (ref 19–32)
CREATININE: 1.75 mg/dL — AB (ref 0.40–1.20)
Chloride: 100 mEq/L (ref 96–112)
GFR: 29.11 mL/min — AB (ref >60.00)
GLUCOSE: 96 mg/dL (ref 70–99)
Potassium: 5.1 mEq/L (ref 3.5–5.1)
Sodium: 140 mEq/L (ref 135–145)

## 2016-04-02 ENCOUNTER — Ambulatory Visit: Payer: Medicare Other | Admitting: Internal Medicine

## 2016-04-08 ENCOUNTER — Other Ambulatory Visit: Payer: Self-pay | Admitting: Internal Medicine

## 2016-04-08 ENCOUNTER — Ambulatory Visit: Payer: Medicare Other | Admitting: Internal Medicine

## 2016-04-15 DIAGNOSIS — H34812 Central retinal vein occlusion, left eye, with macular edema: Secondary | ICD-10-CM | POA: Diagnosis not present

## 2016-04-17 ENCOUNTER — Ambulatory Visit (INDEPENDENT_AMBULATORY_CARE_PROVIDER_SITE_OTHER): Payer: Medicare Other | Admitting: Internal Medicine

## 2016-04-17 ENCOUNTER — Encounter: Payer: Self-pay | Admitting: Internal Medicine

## 2016-04-17 VITALS — BP 160/90 | HR 96 | Temp 97.7°F | Ht <= 58 in | Wt 121.8 lb

## 2016-04-17 DIAGNOSIS — I482 Chronic atrial fibrillation: Secondary | ICD-10-CM

## 2016-04-17 DIAGNOSIS — I5020 Unspecified systolic (congestive) heart failure: Secondary | ICD-10-CM

## 2016-04-17 DIAGNOSIS — I10 Essential (primary) hypertension: Secondary | ICD-10-CM

## 2016-04-17 DIAGNOSIS — I4821 Permanent atrial fibrillation: Secondary | ICD-10-CM

## 2016-04-17 LAB — BASIC METABOLIC PANEL
BUN: 16 mg/dL (ref 6–23)
CO2: 30 mEq/L (ref 19–32)
CREATININE: 1.01 mg/dL (ref 0.40–1.20)
Calcium: 9.9 mg/dL (ref 8.4–10.5)
Chloride: 106 mEq/L (ref 96–112)
GFR: 54.88 mL/min — AB (ref >60.00)
Glucose, Bld: 99 mg/dL (ref 70–99)
Potassium: 4.4 mEq/L (ref 3.5–5.1)
Sodium: 142 mEq/L (ref 135–145)

## 2016-04-17 MED ORDER — FUROSEMIDE 20 MG PO TABS: ORAL_TABLET | ORAL | 3 refills | Status: DC

## 2016-04-17 MED ORDER — POTASSIUM CHLORIDE ER 20 MEQ PO TBCR: EXTENDED_RELEASE_TABLET | ORAL | 1 refills | Status: DC

## 2016-04-17 NOTE — Progress Notes (Signed)
Pre visit review using our clinic review tool, if applicable. No additional management support is needed unless otherwise documented below in the visit note. 

## 2016-04-17 NOTE — Progress Notes (Signed)
Subjective:    Patient ID: Christine Perez, female    DOB: Oct 03, 1925, 83 y.o.   MRN: [id number]  HPI  Admit date: 03/02/2016 Discharge date: 03/04/2016  Discharge Diagnoses:   Principal Problem: CHF exacerbation [facility]) Active Problems: Essential hypertension Atrial fibrillation (HCC) Chronic anticoagulation COPD with acute exacerbation (HCC) Hypertensive urgency Pleural effusion SSS (sick sinus syndrome) (HCC) CHF (congestive heart failure) (HCC)  Wt Readings from Last 3 Encounters:  04/17/16 121 lb 12.8 oz (55.2 kg)  03/18/16 117 lb 3.2 oz (53.2 kg)  03/04/16 120 lb 3.2 oz (89.53 kg)   83 year old patient who is seen today in follow-up.  She was hospitalized in December for decompensated heart failure.  She has a history of paroxysmal atrial fibrillation.  She has been followed by Dr. Elonda Husky, [facility] cardiology, but wishes to switch to a local cardiologist ( Dr. Claiborne Billings)- apparently this has not been set up yet One month ago.  Follow-up electrolytes were obtained and revealed a increase in BUE and an furosemide has been on hold.  She has noticed some weight gain and some mild pedal edema.  No shortness of breath.  She .  Continues to complain of mucus production and cough  Past Medical History:  Diagnosis Date  . Atrial fibrillation ([place])   . CARDIAC ARRHYTHMIA 01/08/2010  . COLONIC POLYPS, HX OF 10/09/2006  . COPD (chronic obstructive pulmonary disease) ([place])   . HYPERLIPIDEMIA 10/09/2006  . HYPERTENSION 10/09/2006  . HYPOTHYROIDISM 10/09/2006  . LASSITUDE 08/15/2009  . OSTEOPOROSIS 10/09/2006  . VERTIGO 08/07/2009  . Wears glasses   . Wears hearing aid    left     Social History   Social History  . Marital status: Widowed    Spouse name: N/A  . Number of children: N/A  . Years of education: N/A   Occupational History  . Not on file.   Social History Main Topics  . Smoking status: Never Smoker  . Smokeless tobacco: Never Used  . Alcohol use  No  . Drug use: No  . Sexual activity: Not on file   Other Topics Concern  . Not on file   Social History Narrative  . No narrative on file    Past Surgical History:  Procedure Laterality Date  . ABDOMINAL HYSTERECTOMY    . APPENDECTOMY    . BREAST SURGERY     left - lumpectomy  . CARDIOVERSION N/A 08/09/2013   Procedure: CARDIOVERSION;  Surgeon: Candee Furbish, MD;  Location: [facility] ENDOSCOPY;  Service: Cardiovascular;  Laterality: N/A;  . CATARACT EXTRACTION    . CHOLECYSTECTOMY    . PACEMAKER PLACEMENT N/A 12/05/2013  . PARTIAL MASTECTOMY WITH NEEDLE LOCALIZATION Left 06/29/2012   Procedure: PARTIAL MASTECTOMY WITH NEEDLE LOCALIZATION;  Surgeon: Adin Hector, MD;  Location: [facility];  Service: General;  Laterality: Left;  . TONSILLECTOMY      No family history on file.  Allergies  Allergen Reactions  . Codeine Shortness Of Breath    Difficulty breathing    Current Outpatient Prescriptions on File Prior to Visit  Medication Sig Dispense Refill  . amiodarone (PACERONE) 400 MG tablet Take 100 mg by mouth at bedtime.   2  . calcium-vitamin D (OSCAL WITH D) 500-200 MG-UNIT per tablet Take 1 tablet by mouth 2 (two) times daily.     Marland Kitchen ipratropium (ATROVENT HFA) 17 MCG/ACT inhaler Inhale 2 puffs into the lungs every 4 (four) hours as needed for wheezing. 1 Inhaler 12  .  losartan (COZAAR) 100 MG tablet TAKE ONE TABLET BY MOUTH ONCE DAILY 90 tablet 1  . metoprolol succinate (TOPROL-XL) 50 MG 24 hr tablet Take 50 mg by mouth daily. Take with or immediately following a meal.    . Multiple Vitamin (MULTIVITAMIN) tablet Take 1 tablet by mouth daily.    Marland Kitchen SYNTHROID 75 MCG tablet TAKE ONE TABLET BY MOUTH ONCE DAILY 90 tablet 1  . vitamin C (ASCORBIC ACID) 500 MG tablet Take 500 mg by mouth daily.    Marland Kitchen zolpidem (AMBIEN) 5 MG tablet TAKE ONE TABLET BY MOUTH AT BEDTIME 30 tablet 5  . furosemide (LASIX) 40 MG tablet Take 1 tablet (40 mg total) by mouth daily. 30 tablet 0  .  potassium chloride 20 MEQ TBCR Take 20 mEq by mouth 2 (two) times daily. 60 tablet 1  . Rivaroxaban (XARELTO) 15 MG TABS tablet Take 1 tablet (15 mg total) by mouth daily with supper. 30 tablet 1  . [DISCONTINUED] metoprolol tartrate (LOPRESSOR) 25 MG tablet Take 1 tablet (25 mg total) by mouth 2 (two) times daily. 60 tablet 5   No current facility-administered medications on file prior to visit.     BP (!) 160/90 (BP Location: Left Arm, Patient Position: Sitting, Cuff Size: Normal)   Pulse 96   Temp 97.7 F (36.5 C) (Oral)   Ht 4\' 10"  (1.473 m)   Wt 121 lb 12.8 oz (55.2 kg)   BMI 25.46 kg/m    Review of Systems  Constitutional: Positive for unexpected weight change.  HENT: Positive for postnasal drip and rhinorrhea. Negative for congestion, dental problem, hearing loss, sinus pressure, sore throat and tinnitus.   Eyes: Negative for pain, discharge and visual disturbance.  Respiratory: Positive for cough. Negative for shortness of breath.   Cardiovascular: Positive for leg swelling. Negative for chest pain and palpitations.  Gastrointestinal: Negative for abdominal distention, abdominal pain, blood in stool, constipation, diarrhea, nausea and vomiting.  Genitourinary: Negative for difficulty urinating, dysuria, flank pain, frequency, hematuria, pelvic pain, urgency, vaginal bleeding, vaginal discharge and vaginal pain.  Musculoskeletal: Negative for arthralgias, gait problem and joint swelling.  Skin: Negative for rash.  Neurological: Negative for dizziness, syncope, speech difficulty, weakness, numbness and headaches.  Hematological: Negative for adenopathy.  Psychiatric/Behavioral: Negative for agitation, behavioral problems and dysphoric mood. The patient is not nervous/anxious.        Objective:   Physical Exam  Constitutional: She is oriented to person, place, and time. She appears well-developed and well-nourished.  HENT:  Head: Normocephalic.  Right Ear: External ear  normal.  Left Ear: External ear normal.  Mouth/Throat: Oropharynx is clear and moist.  Eyes: Conjunctivae and EOM are normal. Pupils are equal, round, and reactive to light.  Neck: Normal range of motion. Neck supple. No thyromegaly present.  Cardiovascular: Normal rate, regular rhythm, normal heart sounds and intact distal pulses.   Rhythm is regular  Pulmonary/Chest: Effort normal and breath sounds normal. No respiratory distress. She has no wheezes. She has no rales.  kyphosis  Abdominal: Soft. Bowel sounds are normal. She exhibits no mass. There is no tenderness.  Musculoskeletal: Normal range of motion. She exhibits no edema.  Mild pedal edema, more prominent on the left  Lymphadenopathy:    She has no cervical adenopathy.  Neurological: She is alert and oriented to person, place, and time.  Skin: Skin is warm and dry. No rash noted.  Psychiatric: She has a normal mood and affect. Her behavior is normal.  Assessment & Plan:   Congestive heart failure.  There is been some modest weight gain and pedal edema.  No shortness of breath.  Main complaint continues to be sputum production.  Will resume furosemide 20 mg Monday, Wednesday, Friday.  Follow-up cardiology Atrial fibrillation.  Continue anticoagulation Hypertension  Follow-up 2 months Cardiology referral  Nyoka Cowden

## 2016-04-17 NOTE — Patient Instructions (Signed)
Furosemide 20 mg Monday, Wednesday and Friday only Potassium 1 tablet Monday, Wednesday and Friday only  Cardiology follow-up  Report any weight gain, worsening swelling or shortness of breath  Limit your sodium (Salt) intake

## 2016-05-06 ENCOUNTER — Other Ambulatory Visit: Payer: Self-pay | Admitting: Internal Medicine

## 2016-05-06 MED ORDER — RIVAROXABAN 15 MG PO TABS: 15.0000 mg | ORAL_TABLET | Freq: Every day | ORAL | 1 refills | Status: DC

## 2016-05-08 ENCOUNTER — Other Ambulatory Visit: Payer: Self-pay | Admitting: Internal Medicine

## 2016-05-08 MED ORDER — RIVAROXABAN 15 MG PO TABS: 15.0000 mg | ORAL_TABLET | Freq: Every day | ORAL | 1 refills | Status: DC

## 2016-05-28 ENCOUNTER — Ambulatory Visit: Payer: Medicare Other | Admitting: Cardiovascular Disease

## 2016-06-04 ENCOUNTER — Ambulatory Visit (INDEPENDENT_AMBULATORY_CARE_PROVIDER_SITE_OTHER): Payer: Medicare Other | Admitting: Cardiovascular Disease

## 2016-06-04 ENCOUNTER — Encounter: Payer: Self-pay | Admitting: Cardiovascular Disease

## 2016-06-04 VITALS — BP 160/100 | HR 78 | Ht <= 58 in | Wt 122.2 lb

## 2016-06-04 DIAGNOSIS — I5032 Chronic diastolic (congestive) heart failure: Secondary | ICD-10-CM

## 2016-06-04 DIAGNOSIS — Z7901 Long term (current) use of anticoagulants: Secondary | ICD-10-CM

## 2016-06-04 DIAGNOSIS — I10 Essential (primary) hypertension: Secondary | ICD-10-CM | POA: Diagnosis not present

## 2016-06-04 DIAGNOSIS — Z79899 Other long term (current) drug therapy: Secondary | ICD-10-CM

## 2016-06-04 DIAGNOSIS — Z950 Presence of cardiac pacemaker: Secondary | ICD-10-CM

## 2016-06-04 DIAGNOSIS — I484 Atypical atrial flutter: Secondary | ICD-10-CM | POA: Diagnosis not present

## 2016-06-04 DIAGNOSIS — E038 Other specified hypothyroidism: Secondary | ICD-10-CM | POA: Diagnosis not present

## 2016-06-04 DIAGNOSIS — I495 Sick sinus syndrome: Secondary | ICD-10-CM | POA: Diagnosis not present

## 2016-06-04 DIAGNOSIS — E785 Hyperlipidemia, unspecified: Secondary | ICD-10-CM | POA: Diagnosis not present

## 2016-06-04 MED ORDER — AMIODARONE HCL 200 MG PO TABS: 200.0000 mg | ORAL_TABLET | Freq: Two times a day (BID) | ORAL | 6 refills | Status: DC

## 2016-06-04 NOTE — Patient Instructions (Signed)
Your physician has recommended you make the following change in your medication:   1.) the amiodarone has been changed to 200 mg twice a day.  Your physician recommends that you return for lab work fasting.  Your physician recommends that you schedule a follow-up appointment in: 3 weeks with Dr Claiborne Billings.

## 2016-06-04 NOTE — Progress Notes (Signed)
In office pacemaker interrogation performed. Medtronic advisa DR MRI, model [id number], serial number [id number], implanted September 2015. Battery voltage 3.02 V (RRT 2.83 V), estimated longevity 6.5-9.5 years. Leads are Medtronic 5076 leads. Atrial lead impedance 456 ohms, threshold 0.75 V at 0.4 ms (last tested 03/03/2016), P wave 1.4 mV. Ventricular lead impedance 437 ohms, threshold 0.75 V at 0.4 ms checked today, R-wave 15.8 mV. Vice programmed MVP (AAIR-DDDR) with a lower rate limit 70 bpm and upper tracking/sensor rate to 130 bpm, mode switch at 170 bpm. Presenting rhythm is slow atrial flutter/atrial tachycardia with a rate of 160 bpm, (under mode switch, onset uncertain). In a patient on amiodarone. Counters show roughly 51% atrial pacing and 10% ventricular pacing with satisfactory heart rate histogram distribution Arrhythmia log shows persistent atrial fibrillation and organized rapid atrial flutter lasting for several months in July-August-September and again in January-February with adequate ventricular rate control during the last episode of persistent arrhythmia.. Activity level was roughly 2 hours per day until a fall in late November, slowly recovering since that time.  An attempt was made to terminate the atrial tachycardia/slow atrial flutter with burst pacing from the atrium with decremental cycle lengths from 400 ms gradually decreased to as low as 200 ms. There was no clear and training went and the arrhythmia could not be terminated. This suggests that the reentry circuit may be located in the left atrium or that this might be an automatic atrial tachycardia. Discussed with Dr. Claiborne Billings who plans to increase the current dose of amiodarone.  8 arrangements to enroll in a large remote device clinic and will see in follow-up in 3 months

## 2016-06-05 NOTE — Progress Notes (Addendum)
Cardiology Office Note    Date:  06/05/2016   ID:  Shawntay, Prest 1925/06/20, MRN [id number]  PCP:  Nyoka Cowden, MD  Cardiologist:  Shelva Majestic, MD   Chief Complaint  Patient presents with  . New Patient (Initial Visit)    History of Present Illness:  Christine Perez is a 83 y.o. female who is a former Cardiology patient of Dr. Elonda Husky who is no longer in practice in [facility].  She presents to the office today to establish cardiology care with me.  Christine Perez has a history of sick sinus syndrome as well as paroxysmal atrial fibrillation and atrial flutter and underwent insertion of a Medtronic advisa MRI safe pacemaker on 12/05/2013 by Dr. Elonda Husky.  Since he left [facility], she has been seen by Dr. Atilano Median and last saw him in December 2017.  Her primary physician is Dr. Burnice Logan in [place].  She was admitted to the hospital with CHF exacerbation on December 17 and discharged on 03/04/2006.  She had fallen previous month and had not been very active.  She was having dietary indiscretion and was frequently having canned soup with high sodium content.  She was admitted with acute on chronic diastolic CHF.  Troponin was minimally positive at 0.07 and was felt to be consistent with demand ischemia.  During that evaluation.  An echo Doppler studies revealed an EF of 60-65%.  She had normal wall motion.  There was severe left atrial dilatation, severe right atrial dilatation, and mild RV dilatation.  PA pressure was mildly increased at 34 mm.  In that hospital record it states that the patient had chronic atrial fibrillation, but in the record of Dr. Atilano Median.  It states the patient had paroxysmal atrial fibrillation for which she was treated with amiodarone.  Her medications now include amiodarone, but she has only been taking 100 mg daily, furosemide 20 mg on Monday, Wednesday and Friday, which was started after her hospitalization, losartan 100 mg daily, Toprol-XL 50 mg,  Xarelto 15 mg, Synthroid 75 g for hypothyroidism, and supplemental potassium.  She has decided to have cardiology care be done in [place] and presents to the office today for evaluation.  Past Medical History:  Diagnosis Date  . Atrial fibrillation ([place])   . CARDIAC ARRHYTHMIA 01/08/2010  . COLONIC POLYPS, HX OF 10/09/2006  . COPD (chronic obstructive pulmonary disease) ([place])   . HYPERLIPIDEMIA 10/09/2006  . HYPERTENSION 10/09/2006  . HYPOTHYROIDISM 10/09/2006  . LASSITUDE 08/15/2009  . OSTEOPOROSIS 10/09/2006  . VERTIGO 08/07/2009  . Wears glasses   . Wears hearing aid    left    Past Surgical History:  Procedure Laterality Date  . ABDOMINAL HYSTERECTOMY    . APPENDECTOMY    . BREAST SURGERY     left - lumpectomy  . CARDIOVERSION N/A 08/09/2013   Procedure: CARDIOVERSION;  Surgeon: Candee Furbish, MD;  Location: [facility] ENDOSCOPY;  Service: Cardiovascular;  Laterality: N/A;  . CATARACT EXTRACTION    . CHOLECYSTECTOMY    . PACEMAKER PLACEMENT N/A 12/05/2013  . PARTIAL MASTECTOMY WITH NEEDLE LOCALIZATION Left 06/29/2012   Procedure: PARTIAL MASTECTOMY WITH NEEDLE LOCALIZATION;  Surgeon: Adin Hector, MD;  Location: [facility];  Service: General;  Laterality: Left;  . TONSILLECTOMY      Current Medications: Outpatient Medications Prior to Visit  Medication Sig Dispense Refill  . calcium-vitamin D (OSCAL WITH D) 500-200 MG-UNIT per tablet Take 1 tablet by mouth 2 (two) times daily.     Marland Kitchen  furosemide (LASIX) 20 MG tablet 20 mg Monday, Wednesday and Friday only 30 tablet 3  . ipratropium (ATROVENT HFA) 17 MCG/ACT inhaler Inhale 2 puffs into the lungs every 4 (four) hours as needed for wheezing. 1 Inhaler 12  . losartan (COZAAR) 100 MG tablet TAKE ONE TABLET BY MOUTH ONCE DAILY 90 tablet 1  . metoprolol succinate (TOPROL-XL) 50 MG 24 hr tablet Take 50 mg by mouth daily. Take with or immediately following a meal.    . Multiple Vitamin (MULTIVITAMIN) tablet Take 1 tablet by  mouth daily.    . Rivaroxaban (XARELTO) 15 MG TABS tablet Take 1 tablet (15 mg total) by mouth daily with supper. 30 tablet 1  . SYNTHROID 75 MCG tablet TAKE ONE TABLET BY MOUTH ONCE DAILY 90 tablet 1  . vitamin C (ASCORBIC ACID) 500 MG tablet Take 500 mg by mouth daily.    Marland Kitchen zolpidem (AMBIEN) 5 MG tablet TAKE ONE TABLET BY MOUTH AT BEDTIME 30 tablet 5  . amiodarone (PACERONE) 400 MG tablet Take 100 mg by mouth at bedtime.   2  . Potassium Chloride ER 20 MEQ TBCR 1 tablet Monday, Wednesday, Friday 60 tablet 1   No facility-administered medications prior to visit.      Allergies:   Codeine   Social History   Social History  . Marital status: Widowed    Spouse name: N/A  . Number of children: N/A  . Years of education: N/A   Social History Main Topics  . Smoking status: Never Smoker  . Smokeless tobacco: Never Used  . Alcohol use No  . Drug use: No  . Sexual activity: Not Asked   Other Topics Concern  . None   Social History Narrative  . None    She is widowed.  She currently is living with her daughter in [place]..  There is no tobacco history.  She does not drink alcohol. She previously was a self-employed bookkeeper.  She had a BFA at [employer].  Family History:  The patient's family history is not on file.   Family  history is notable in that parents are deceased.  Mother had colon cancer and died at age 27.  Her father had a heart attack and died at age 25., sister and maternal cousin had breast cancer, maternal aunt with ovarian cancer.  She has one son and a daughter.  ROS General: Negative; No fevers, chills, or night sweats;  HEENT: Negative; No changes in vision or hearing, sinus congestion, difficulty swallowing Pulmonary: Negative; No cough, wheezing, shortness of breath, hemoptysis Cardiovascular: See history of present illness, positive for sick sinus syndrome status post pacemaker. GI: Positive for colonic polyps GU: Negative; No dysuria, hematuria, or  difficulty voiding Musculoskeletal: Negative; no myalgias, joint pain, or weakness Hematologic/Oncology: Negative; no easy bruising, bleeding Endocrine: Positive for hypothyroidism Neuro: Negative; no changes in balance, headaches Skin: Negative; No rashes or skin lesions Psychiatric: Negative; No behavioral problems, depression Sleep: Negative; No snoring, daytime sleepiness, hypersomnolence, bruxism, restless legs, hypnogognic hallucinations, no cataplexy Other comprehensive 14 point system review is negative.   PHYSICAL EXAM:   VS:  BP (!) 160/100   Pulse 78   Ht 4' 10"  (1.473 m)   Wt 122 lb 3.2 oz (55.4 kg)   BMI 25.54 kg/m    Wt Readings from Last 3 Encounters:  06/04/16 122 lb 3.2 oz (55.4 kg)  04/17/16 121 lb 12.8 oz (55.2 kg)  03/18/16 117 lb 3.2 oz (53.2 kg)    General:  Alert, oriented, no distress.  Skin: normal turgor, no rashes, warm and dry HEENT: Normocephalic, atraumatic. Pupils equal round and reactive to light; sclera anicteric; extraocular muscles intact; Fundi Disc flat; no hemorrhages or exudates Nose without nasal septal hypertrophy Mouth/Parynx benign; Mallinpatti scale Neck: No JVD, no carotid bruits; normal carotid upstroke Lungs: clear to ausculatation and percussion; no wheezing or rales Chest wall: without tenderness to palpitation Heart: PMI not displaced, RRR, s1 s2 normal, 1/6 systolic murmur, no diastolic murmur, no rubs, gallops, thrills, or heaves Abdomen: soft, nontender; no hepatosplenomehaly, BS+; abdominal aorta nontender and not dilated by palpation. Back: no CVA tenderness Pulses 2+ Musculoskeletal: full range of motion, normal strength, no joint deformities Extremities: no clubbing cyanosis or edema, Homan's sign negative  Neurologic: grossly nonfocal; Cranial nerves grossly wnl Psychologic: Normal mood and affect   Studies/Labs Reviewed:   EKG:  EKG is ordered today.  ECG (independently read by me): Probable atrial flutter versus  atrial tachycardia with ventricular rate at 78 bpm.  In order to further evaluate the probable atrial flutter, the pacemaker was interrogated by Dr. Recardo Evangelist.  This confirmed slow atrial flutter/atrial tachycardia at a cycle length of 600 ms.  Several unsuccessful attempts at antitachycardia pacing with Versed 400> 300> 200 ms were performed without success.  Recent Labs: BMP Latest Ref Rng & Units 04/17/2016 03/18/2016 03/04/2016  Glucose 70 - 99 mg/dL 99 96 98  BUN 6 - 23 mg/dL 16 30(H) 14  Creatinine 0.40 - 1.20 mg/dL 1.01 1.75(H) 0.94  Sodium 135 - 145 mEq/L 142 140 140  Potassium 3.5 - 5.1 mEq/L 4.4 5.1 4.0  Chloride 96 - 112 mEq/L 106 100 100(L)  CO2 19 - 32 mEq/L 30 31 31   Calcium 8.4 - 10.5 mg/dL 9.9 10.6(H) 9.6     Hepatic Function Latest Ref Rng & Units 03/04/2016 09/04/2015 08/29/2014  Total Protein 6.5 - 8.1 g/dL 5.4(L) 7.3 7.4  Albumin 3.5 - 5.0 g/dL 2.7(L) 4.4 4.4  AST 15 - 41 U/L 31 33 28  ALT 14 - 54 U/L 21 37(H) 25  Alk Phosphatase 38 - 126 U/L 95 110 84  Total Bilirubin 0.3 - 1.2 mg/dL 0.7 0.4 0.5  Bilirubin, Direct 0.0 - 0.3 mg/dL - - -    CBC Latest Ref Rng & Units 03/03/2016 03/02/2016 09/04/2015  WBC 4.0 - 10.5 K/uL 4.3 8.9 4.2  Hemoglobin 12.0 - 15.0 g/dL 9.3(L) 11.3(L) 12.7  Hematocrit 36.0 - 46.0 % 29.4(L) 36.8 38.7  Platelets 150 - 400 K/uL 124(L) 145(L) 108.0(L)   Lab Results  Component Value Date   MCV 97.0 03/03/2016   MCV 99.2 03/02/2016   MCV 95.9 09/04/2015   Lab Results  Component Value Date   TSH 1.591 03/02/2016   No results found for: HGBA1C   BNP    Component Value Date/Time   BNP 430.3 (H) 03/02/2016 0933    ProBNP No results found for: PROBNP   Lipid Panel     Component Value Date/Time   CHOL 160 09/04/2015 0947   TRIG 91.0 09/04/2015 0947   HDL 65.30 09/04/2015 0947   CHOLHDL 2 09/04/2015 0947   VLDL 18.2 09/04/2015 0947   LDLCALC 77 09/04/2015 0947   LDLDIRECT 133.2 12/21/2012 1207     RADIOLOGY: No results  found.   Additional studies/ records that were reviewed today include:  I reviewed the patient's numerous records from [place] [facility].  [facility].  I also reviewed her recent hospitalization, echo Doppler study, and records  from Dr. Burnice Logan.  03/03/16 ECHO Study Conclusions  - Left ventricle: The cavity size was normal. Systolic function was   normal. The estimated ejection fraction was in the range of 60%   to 65%. Wall motion was normal; there were no regional wall   motion abnormalities. Left ventricular diastolic function   parameters were normal. - Mitral valve: There was mild regurgitation. - Left atrium: The atrium was severely dilated. - Right ventricle: The cavity size was mildly dilated. Wall   thickness was normal. - Right atrium: The atrium was severely dilated. - Tricuspid valve: There was moderate regurgitation. - Pulmonary arteries: PA peak pressure: 34 mm Hg (S).  ASSESSMENT:    1. Essential hypertension   2. Sick sinus syndrome ([place])   3. Pacemaker   4. Atypical atrial flutter ([place])   5. Chronic diastolic heart failure ([place])   6. Hyperlipidemia, unspecified hyperlipidemia type   7. Anticoagulation adequate   8. Other specified hypothyroidism   9. Medication management      PLAN:  Christine Perez is a very pleasant 83 year old female who has a history of paroxysmal atrial fibrillation/atrial flutter and is status post insertion of a Medtronic Advisa MRI safe pacemaker on 12/05/2013 which was inserted by Dr. Elonda Husky in [facility].  She has been maintained on chronic anticoagulation and currently is receiving Xarelto 15 mg.  She has had some issues with falls and as a result, there has been some discussion concerning the possibility of discontinuing anticoagulation due to fall risk.  She has currently been maintained on amiodarone 100 mg daily.  Her ECG today suggests atrial flutter and this was confirmed by pacemaker interrogation.  An attempt at  antitachycardia pacing was performed unsuccessfully, which raises the possibility that this may be atypical atrial flutter.  She has a history of hypertension and has been maintained on losartan 100 mg, Toprol-XL 50 mg, and furosemide recently started at 20 mg Monday, Wednesday and Fridays.  With her atrial flutter.  Presently, I have suggested a short increased titration of amiodarone to 200 mg twice a day for several weeks and then I will reduce the dose back down to 200 mg.  Her recent hospitalization in December may have been precipitated by dietary indiscretion and frequent use of canned soups and vegetables.  She was felt to have acute on chronic diastolic heart failure and her EF was normal.  I am recommending laboratory be obtained in the fasting state.  An additional medication adjustment may be necessary.  We discussed the risk and anticoagulation.  I also presented her to Dr. Sallyanne Kuster who verified the atrial flutter with pacemaker interrogation and attempted anti-tachycardic pacing which was unsuccessful. She will be enrolled in the every 3 month remote pacemaker monitoring program.  I will see her in 2-3 weeks for follow-up cardiology evaluation and further recommendations will be made at that time.   Medication Adjustments/Labs and Tests Ordered: Current medicines are reviewed at length with the patient today.  Concerns regarding medicines are outlined above.  Medication changes, Labs and Tests ordered today are listed in the Patient Instructions below. Patient Instructions  Your physician has recommended you make the following change in your medication:   1.) the amiodarone has been changed to 200 mg twice a day.  Your physician recommends that you return for lab work fasting.  Your physician recommends that you schedule a follow-up appointment in: 3 weeks with Dr Claiborne Billings.      Time spent: 60 minutes Signed, Shelva Majestic,  MD , [facility] 06/05/2016 7:31 PM    [facility] [address] [place], [place], [place]  [address] Phone: [phone]

## 2016-06-09 ENCOUNTER — Other Ambulatory Visit: Payer: Self-pay | Admitting: Cardiovascular Disease

## 2016-06-09 ENCOUNTER — Telehealth: Payer: Self-pay | Admitting: Cardiovascular Disease

## 2016-06-09 MED ORDER — METOPROLOL SUCCINATE ER 50 MG PO TB24: 50.0000 mg | ORAL_TABLET | Freq: Every day | ORAL | 11 refills | Status: DC

## 2016-06-09 NOTE — Telephone Encounter (Signed)
Rx(s) sent to pharmacy electronically.  

## 2016-06-09 NOTE — Telephone Encounter (Signed)
New message    Pt c/o swelling: STAT is pt has developed SOB within 24 hours  1. How long have you been experiencing swelling? Since before christmas, and thinks she needs to ioncrease furosemide (LASIX) 20 MG tablet  2. Where is the swelling located? Legs and ankles  3.  Are you currently taking a "fluid pill"? Yes - not working  4.  Are you currently SOB? no  5.  Have you traveled recently? No

## 2016-06-09 NOTE — Telephone Encounter (Signed)
Spoke to patient. She states she having extra swelling  Left >right ankle .   she states she saw Dr Claiborne Billings , last week.   lasix is now taken Monday, wednesday , friday .  offered  Follow up appointment  Patient decline.  No shortness of breath.  weight  06/07/16  121 lbs Weight 06/08/16 122.5 lbs Weight 06/09/16  123 lbs   PER  PROTOCOL , TAKE AN EXTRA TABLET 20 MG LASIX TOMORROW MORNING.  CONTINUE TO  MONITOR WEIGHT.   WILL DEFER TO DR Claiborne Billings FOR FURTHER INSTRUCTIONS.

## 2016-06-09 NOTE — Telephone Encounter (Signed)
New message    *STAT* If patient is at the pharmacy, call can be transferred to refill team.   1. Which medications need to be refilled? (please list name of each medication and dose if known) metoprolol succinate (TOPROL-XL) 50 MG 24 hr tablet  2. Which pharmacy/location (including street and city if local pharmacy) is medication to be sent to? [facility]  3. Do they need a 30 day or 90 day supply? 30 day supply

## 2016-06-10 DIAGNOSIS — H35352 Cystoid macular degeneration, left eye: Secondary | ICD-10-CM | POA: Diagnosis not present

## 2016-06-10 DIAGNOSIS — H34812 Central retinal vein occlusion, left eye, with macular edema: Secondary | ICD-10-CM | POA: Diagnosis not present

## 2016-06-12 DIAGNOSIS — D3132 Benign neoplasm of left choroid: Secondary | ICD-10-CM | POA: Diagnosis not present

## 2016-06-12 DIAGNOSIS — Z961 Presence of intraocular lens: Secondary | ICD-10-CM | POA: Diagnosis not present

## 2016-06-12 DIAGNOSIS — H348122 Central retinal vein occlusion, left eye, stable: Secondary | ICD-10-CM | POA: Diagnosis not present

## 2016-06-12 DIAGNOSIS — H35352 Cystoid macular degeneration, left eye: Secondary | ICD-10-CM | POA: Diagnosis not present

## 2016-06-12 DIAGNOSIS — D3131 Benign neoplasm of right choroid: Secondary | ICD-10-CM | POA: Diagnosis not present

## 2016-06-16 ENCOUNTER — Other Ambulatory Visit: Payer: Self-pay

## 2016-06-18 NOTE — Telephone Encounter (Signed)
Spoke to patient Patient aware of instruction-- follow up appt 06/27/16

## 2016-06-18 NOTE — Telephone Encounter (Signed)
If swelling persists can take daily as needed

## 2016-06-25 ENCOUNTER — Telehealth: Payer: Self-pay | Admitting: Cardiovascular Disease

## 2016-06-25 NOTE — Telephone Encounter (Signed)
Spoke with pt she will be in tomorrow for fasting blood work then to appt friday

## 2016-06-25 NOTE — Telephone Encounter (Signed)
Christine Perez is wanting to know if she will have labs before her appt on 06/27/16 and should she fast for the labs . Please call   Thanks

## 2016-06-26 DIAGNOSIS — Z79899 Other long term (current) drug therapy: Secondary | ICD-10-CM | POA: Diagnosis not present

## 2016-06-26 DIAGNOSIS — I10 Essential (primary) hypertension: Secondary | ICD-10-CM | POA: Diagnosis not present

## 2016-06-26 DIAGNOSIS — E038 Other specified hypothyroidism: Secondary | ICD-10-CM | POA: Diagnosis not present

## 2016-06-26 DIAGNOSIS — E785 Hyperlipidemia, unspecified: Secondary | ICD-10-CM | POA: Diagnosis not present

## 2016-06-26 DIAGNOSIS — I483 Typical atrial flutter: Secondary | ICD-10-CM | POA: Diagnosis not present

## 2016-06-26 LAB — COMPREHENSIVE METABOLIC PANEL
ALT: 27 U/L (ref 6–29)
AST: 30 U/L (ref 10–35)
Albumin: 4.2 g/dL (ref 3.6–5.1)
Alkaline Phosphatase: 112 U/L (ref 33–130)
BUN: 13 mg/dL (ref 7–25)
CALCIUM: 10.6 mg/dL — AB (ref 8.6–10.4)
CHLORIDE: 105 mmol/L (ref 98–110)
CO2: 30 mmol/L (ref 20–31)
Creat: 1.06 mg/dL — ABNORMAL HIGH (ref 0.60–0.88)
GLUCOSE: 85 mg/dL (ref 65–99)
POTASSIUM: 4.9 mmol/L (ref 3.5–5.3)
Sodium: 143 mmol/L (ref 135–146)
TOTAL PROTEIN: 7.1 g/dL (ref 6.1–8.1)
Total Bilirubin: 0.6 mg/dL (ref 0.2–1.2)

## 2016-06-26 LAB — CBC
HEMATOCRIT: 41 % (ref 35.0–45.0)
Hemoglobin: 13 g/dL (ref 11.7–15.5)
MCH: 30.4 pg (ref 27.0–33.0)
MCHC: 31.7 g/dL — ABNORMAL LOW (ref 32.0–36.0)
MCV: 95.8 fL (ref 80.0–100.0)
MPV: 12 fL (ref 7.5–12.5)
PLATELETS: 121 10*3/uL — AB (ref 140–400)
RBC: 4.28 MIL/uL (ref 3.80–5.10)
RDW: 16.1 % — AB (ref 11.0–15.0)
WBC: 3.4 10*3/uL — ABNORMAL LOW (ref 3.8–10.8)

## 2016-06-26 LAB — LIPID PANEL
CHOL/HDL RATIO: 2.7 ratio (ref <5.0)
Cholesterol: 191 mg/dL (ref <200)
HDL: 71 mg/dL (ref >50)
LDL Cholesterol: 102 mg/dL — ABNORMAL HIGH (ref <100)
TRIGLYCERIDES: 90 mg/dL (ref <150)
VLDL: 18 mg/dL (ref <30)

## 2016-06-26 LAB — TSH: TSH: 3.81 m[IU]/L

## 2016-06-26 LAB — MAGNESIUM: MAGNESIUM: 2.1 mg/dL (ref 1.5–2.5)

## 2016-06-27 ENCOUNTER — Encounter: Payer: Self-pay | Admitting: Cardiovascular Disease

## 2016-06-27 ENCOUNTER — Ambulatory Visit (INDEPENDENT_AMBULATORY_CARE_PROVIDER_SITE_OTHER): Payer: Medicare Other | Admitting: Cardiovascular Disease

## 2016-06-27 VITALS — BP 140/92 | HR 86 | Ht 59.0 in | Wt 125.0 lb

## 2016-06-27 DIAGNOSIS — E038 Other specified hypothyroidism: Secondary | ICD-10-CM | POA: Diagnosis not present

## 2016-06-27 DIAGNOSIS — M25472 Effusion, left ankle: Secondary | ICD-10-CM

## 2016-06-27 DIAGNOSIS — I10 Essential (primary) hypertension: Secondary | ICD-10-CM | POA: Diagnosis not present

## 2016-06-27 DIAGNOSIS — I484 Atypical atrial flutter: Secondary | ICD-10-CM | POA: Diagnosis not present

## 2016-06-27 DIAGNOSIS — E785 Hyperlipidemia, unspecified: Secondary | ICD-10-CM

## 2016-06-27 MED ORDER — METOPROLOL SUCCINATE ER 50 MG PO TB24: ORAL_TABLET | ORAL | 11 refills | Status: DC

## 2016-06-27 NOTE — Progress Notes (Signed)
Cardiology Office Note    Date:  06/27/2016   ID:  Christine Perez, [place] 01-06-1926, MRN [id number]  PCP:  Nyoka Cowden, MD  Cardiologist:  Shelva Majestic, MD   Chief Complaint  Patient presents with  . Follow-up    no chest pain, had some shortness of breath, edema in feet, no pain or cramping in legs, no lightheaded or dizziness    History of Present Illness:  Christine Perez is a 83 y.o. female who is a former Cardiology patient of Dr. Elonda Husky who is no longer in practice in [facility].  She established cardiology care with me on 06/05/2016.  She presents for follow up evaluation.  [name] has a history of sick sinus syndrome as well as paroxysmal atrial fibrillation and atrial flutter and underwent insertion of a Medtronic advisa MRI safe pacemaker on 12/05/2013 by Dr. Elonda Husky.  Since he left [facility], she has been seen by Dr. Atilano Median and last saw him in December 2017.  Her primary physician is Dr. Burnice Logan in [place].  She was admitted to the hospital with CHF exacerbation on December 17 and discharged on 03/04/2006.  She had fallen previous month and had not been very active.  She was having dietary indiscretion and was frequently having canned soup with high sodium content.  She was admitted with acute on chronic diastolic CHF.  Troponin was minimally positive at 0.07 and was felt to be consistent with demand ischemia.  During that evaluation.  An echo Doppler studies revealed an EF of 60-65%.  She had normal wall motion.  There was severe left atrial dilatation, severe right atrial dilatation, and mild RV dilatation.  PA pressure was mildly increased at 34 mm.  In that hospital record it states that the patient had chronic atrial fibrillation, but in the record of Dr. Atilano Median.  It states the patient had paroxysmal atrial fibrillation for which she was treated with amiodarone.  Her medications now include amiodarone, but she has only been taking 100 mg daily,  furosemide 20 mg on Monday, Wednesday and Friday, which was started after her hospitalization, losartan 100 mg daily, Toprol-XL 50 mg, Xarelto 15 mg, Synthroid 75 g for hypothyroidism, and supplemental potassium.   When I saw her for initial evaluation on 06/05/2016, her ECG suggested atrial flutter.  Her pacemaker was interrogated, which confirmed atrial flutter.  An attempt at antitachycardia pacing by Dr. Sallyanne Kuster was unsuccessful, raising the possibility of atypical flutter.  When I saw her, I recommended that she increase amiodarone and since that time, she has been on 200 mg twice a day.  He has noticed some mild shortness of breath and some trace ankle edema.  She has been taking furosemide every third day milligrams.  She is on Xarelto 15 mg for anticoagulation.  She is also on losartan 100 mg and Toprol-XL 50 mg for her hypertension.  She denies chest pain.  She presents for reevaluation.  Past Medical History:  Diagnosis Date  . Atrial fibrillation ([place])   . CARDIAC ARRHYTHMIA 01/08/2010  . COLONIC POLYPS, HX OF 10/09/2006  . COPD (chronic obstructive pulmonary disease) ([place])   . HYPERLIPIDEMIA 10/09/2006  . HYPERTENSION 10/09/2006  . HYPOTHYROIDISM 10/09/2006  . LASSITUDE 08/15/2009  . OSTEOPOROSIS 10/09/2006  . VERTIGO 08/07/2009  . Wears glasses   . Wears hearing aid    left    Past Surgical History:  Procedure Laterality Date  . ABDOMINAL HYSTERECTOMY    . APPENDECTOMY    .  BREAST SURGERY     left - lumpectomy  . CARDIOVERSION N/A 08/09/2013   Procedure: CARDIOVERSION;  Surgeon: Candee Furbish, MD;  Location: [facility] ENDOSCOPY;  Service: Cardiovascular;  Laterality: N/A;  . CATARACT EXTRACTION    . CHOLECYSTECTOMY    . PACEMAKER PLACEMENT N/A 12/05/2013  . PARTIAL MASTECTOMY WITH NEEDLE LOCALIZATION Left 06/29/2012   Procedure: PARTIAL MASTECTOMY WITH NEEDLE LOCALIZATION;  Surgeon: Adin Hector, MD;  Location: [facility];  Service: General;  Laterality: Left;  .  TONSILLECTOMY      Current Medications: Outpatient Medications Prior to Visit  Medication Sig Dispense Refill  . amiodarone (PACERONE) 200 MG tablet Take 1 tablet (200 mg total) by mouth 2 (two) times daily. 60 tablet 6  . calcium-vitamin D (OSCAL WITH D) 500-200 MG-UNIT per tablet Take 1 tablet by mouth 2 (two) times daily.     . furosemide (LASIX) 20 MG tablet 20 mg Monday, Wednesday and Friday only (Patient taking differently: Take 20 mg by mouth daily. ) 30 tablet 3  . ipratropium (ATROVENT HFA) 17 MCG/ACT inhaler Inhale 2 puffs into the lungs every 4 (four) hours as needed for wheezing. 1 Inhaler 12  . losartan (COZAAR) 100 MG tablet TAKE ONE TABLET BY MOUTH ONCE DAILY 90 tablet 1  . metoprolol succinate (TOPROL-XL) 50 MG 24 hr tablet Take 1 tablet (50 mg total) by mouth daily. Take with or immediately following a meal. 30 tablet 11  . Multiple Vitamin (MULTIVITAMIN) tablet Take 1 tablet by mouth daily.    . potassium chloride SA (K-DUR,KLOR-CON) 20 MEQ tablet Take 20 mEq by mouth 2 (two) times daily.    . Rivaroxaban (XARELTO) 15 MG TABS tablet Take 1 tablet (15 mg total) by mouth daily with supper. 30 tablet 1  . SYNTHROID 75 MCG tablet TAKE ONE TABLET BY MOUTH ONCE DAILY 90 tablet 1  . vitamin C (ASCORBIC ACID) 500 MG tablet Take 500 mg by mouth daily.    Marland Kitchen zolpidem (AMBIEN) 5 MG tablet TAKE ONE TABLET BY MOUTH AT BEDTIME 30 tablet 5   No facility-administered medications prior to visit.      Allergies:   Codeine   Social History   Social History  . Marital status: Widowed    Spouse name: N/A  . Number of children: N/A  . Years of education: N/A   Social History Main Topics  . Smoking status: Never Smoker  . Smokeless tobacco: Never Used  . Alcohol use No  . Drug use: No  . Sexual activity: Not Asked   Other Topics Concern  . None   Social History Narrative  . None    She is widowed.  She currently is living with her daughter in [place]..  There is no tobacco  history.  She does not drink alcohol. She previously was a self-employed bookkeeper.  She had a BFA at [employer].  Family History:  The patient's family history is not on file.   Family  history is notable in that parents are deceased.  Mother had colon cancer and died at age 66.  Her father had a heart attack and died at age 23., sister and maternal cousin had breast cancer, maternal aunt with ovarian cancer.  She has one son and a daughter.  ROS General: Negative; No fevers, chills, or night sweats;  HEENT: Negative; No changes in vision or hearing, sinus congestion, difficulty swallowing Pulmonary: Negative; No cough, wheezing, shortness of breath, hemoptysis Cardiovascular: See history of present illness, positive  for sick sinus syndrome status post pacemaker. GI: Positive for colonic polyps GU: Negative; No dysuria, hematuria, or difficulty voiding Musculoskeletal: Negative; no myalgias, joint pain, or weakness Hematologic/Oncology: Negative; no easy bruising, bleeding Endocrine: Positive for hypothyroidism Neuro: Negative; no changes in balance, headaches Skin: Negative; No rashes or skin lesions Psychiatric: Negative; No behavioral problems, depression Sleep: Negative; No snoring, daytime sleepiness, hypersomnolence, bruxism, restless legs, hypnogognic hallucinations, no cataplexy Other comprehensive 14 point system review is negative.   PHYSICAL EXAM:   BP (!) 140/92   Pulse 86   Ht 4' 11"  (1.499 m)   Wt 125 lb (56.7 kg)   BMI 25.25 kg/m    Repeat blood pressure by me 136/88    Wt Readings from Last 3 Encounters:  06/04/16 122 lb 3.2 oz (55.4 kg)  04/17/16 121 lb 12.8 oz (55.2 kg)  03/18/16 117 lb 3.2 oz (53.2 kg)    General: Alert, oriented, no distress.  Skin: normal turgor, no rashes, warm and dry HEENT: Normocephalic, atraumatic. Pupils equal round and reactive to light; sclera anicteric; extraocular muscles intact; Fundi Disc flat; no hemorrhages or exudates Nose  without nasal septal hypertrophy Mouth/Parynx benign; Mallinpatti scale 3 Neck: No JVD, no carotid bruits; normal carotid upstroke Lungs: clear to ausculatation and percussion; no wheezing or rales Chest wall: without tenderness to palpitation Heart: PMI not displaced, regular  in the 80s, s1 s2 normal, 1/6 systolic murmur, no diastolic murmur, no rubs, gallops, thrills, or heaves Abdomen: soft, nontender; no hepatosplenomehaly, BS+; abdominal aorta nontender and not dilated by palpation. Back: no CVA tenderness Pulses 2+ Musculoskeletal: full range of motion, normal strength, no joint deformities Extremities: Trivial left ankle edema; no  clubbing cyanosis, Homan's sign negative  Neurologic: grossly nonfocal; Cranial nerves grossly wnl Psychologic: Normal mood and affect   Studies/Labs Reviewed:   ECG (independently read by me): Atrial flutter at 87 bpm.  QTC 454 ms.  06/05/2016  ECG (independently read by me): Probable atrial flutter versus atrial tachycardia with ventricular rate at 78 bpm.  In order to further evaluate the probable atrial flutter, the pacemaker was interrogated by Dr. Recardo Evangelist.  This confirmed slow atrial flutter/atrial tachycardia at a cycle length of 600 ms.  Several unsuccessful attempts at antitachycardia pacing with Versed 400> 300> 200 ms were performed without success.  Recent Labs: BMP Latest Ref Rng & Units 06/26/2016 04/17/2016 03/18/2016  Glucose 65 - 99 mg/dL 85 99 96  BUN 7 - 25 mg/dL 13 16 30(H)  Creatinine 0.60 - 0.88 mg/dL 1.06(H) 1.01 1.75(H)  Sodium 135 - 146 mmol/L 143 142 140  Potassium 3.5 - 5.3 mmol/L 4.9 4.4 5.1  Chloride 98 - 110 mmol/L 105 106 100  CO2 20 - 31 mmol/L 30 30 31   Calcium 8.6 - 10.4 mg/dL 10.6(H) 9.9 10.6(H)     Hepatic Function Latest Ref Rng & Units 06/26/2016 03/04/2016 09/04/2015  Total Protein 6.1 - 8.1 g/dL 7.1 5.4(L) 7.3  Albumin 3.6 - 5.1 g/dL 4.2 2.7(L) 4.4  AST 10 - 35 U/L 30 31 33  ALT 6 - 29 U/L 27 21 37(H)  Alk  Phosphatase 33 - 130 U/L 112 95 110  Total Bilirubin 0.2 - 1.2 mg/dL 0.6 0.7 0.4  Bilirubin, Direct 0.0 - 0.3 mg/dL - - -    CBC Latest Ref Rng & Units 06/26/2016 03/03/2016 03/02/2016  WBC 3.8 - 10.8 K/uL 3.4(L) 4.3 8.9  Hemoglobin 11.7 - 15.5 g/dL 13.0 9.3(L) 11.3(L)  Hematocrit 35.0 - 45.0 % 41.0 29.4(L)  36.8  Platelets 140 - 400 K/uL 121(L) 124(L) 145(L)   Lab Results  Component Value Date   MCV 95.8 06/26/2016   MCV 97.0 03/03/2016   MCV 99.2 03/02/2016   Lab Results  Component Value Date   TSH 3.81 06/26/2016   No results found for: HGBA1C   BNP    Component Value Date/Time   BNP 430.3 (H) 03/02/2016 0933    ProBNP No results found for: PROBNP   Lipid Panel     Component Value Date/Time   CHOL 191 06/26/2016 0951   TRIG 90 06/26/2016 0951   HDL 71 06/26/2016 0951   CHOLHDL 2.7 06/26/2016 0951   VLDL 18 06/26/2016 0951   LDLCALC 102 (H) 06/26/2016 0951   LDLDIRECT 133.2 12/21/2012 1207     RADIOLOGY: No results found.   Additional studies/ records that were reviewed today include:  I reviewed the patient's numerous records from [place] [facility], [facility], recent hospitalization, echo Doppler study, and records from Dr. Burnice Logan.  03/03/16 ECHO Study Conclusions  - Left ventricle: The cavity size was normal. Systolic function was   normal. The estimated ejection fraction was in the range of 60%   to 65%. Wall motion was normal; there were no regional wall   motion abnormalities. Left ventricular diastolic function   parameters were normal. - Mitral valve: There was mild regurgitation. - Left atrium: The atrium was severely dilated. - Right ventricle: The cavity size was mildly dilated. Wall   thickness was normal. - Right atrium: The atrium was severely dilated. - Tricuspid valve: There was moderate regurgitation. - Pulmonary arteries: PA peak pressure: 34 mm Hg (S).  ASSESSMENT:    No diagnosis found.   PLAN:  Ms. Greif  is an 83 year old female who has a history of paroxysmal atrial fibrillation/atrial flutter and is status post insertion of a Medtronic Advisa MRI safe pacemaker on 12/05/2013 which was inserted by Dr. Elonda Husky in [facility].  She has been maintained on chronic anticoagulation and currently is receiving Xarelto 15 mg.  She has had some issues with falls and as a result, there has been some discussion concerning the possibility of discontinuing anticoagulation due to fall risk.  She had  been maintained on amiodarone 100 mg daily.  She presented for initial evaluation with me 3 weeks ago, her ECG suggested atrial flutter and this was confirmed by pacemaker interrogation.  An attempt at antitachycardia pacing was performed unsuccessfully, which raises the possibility that this may be atypical atrial flutter.  She has a history of hypertension and has been maintained on losartan 100 mg, Toprol-XL 50 mg, and furosemide recently started at 20 mg Monday, Wednesday and Fridays.  When I saw her, I recommended that she increase amiodarone and she has now been on 200 mg twice a day.  Her ECG today continues to show atrial flutter with a ventricular rate in the 80s.  Will be seeing Dr. Sallyanne Kuster 07/09/2016 for more complete pacemaker evaluation.  I have suggested she continue taking the higher dose amiodarone 200 mg bid until that evaluation.  She will then begin on the higher dose for 6 weeks and should be at a therapeutic steady state.  I reviewed her recent laboratory.  Se has a history of hypothyroidism and is on levothyroxine at 75 g.  TSH is 3.81.  She is not anemic; however, platelet count is mildly reduced at 121.  There is no bleeding.  She had normal LFTs.  Renal fxn remains stable  with a creatinine of 1.06 , improved when compared to 1.75 in January 2018 and is comparable to 1.01 in February 2018.  Lipid studies are minimally increased with an LDL of 102.  She admits to mild shortness of breath with activity.  There is  no chest pain.  There is trace ankle swelling.  Initially, she had been taking furosemide every third day, but for the past 1-2 weeks has been taking this daily.  I have suggested that she try taking this 2 out of 3 days  to avoid potential pre-renal azotemia.  I recommended she increase Toprol-XL from 50 mg to 75 mg until her evaluation with Dr. Sallyanne Kuster and hopefully she may pharmacologically cardiovert or alternatively another attempt at rapid atrial pacing may be successful with her increased therapeutic regimen.  I will see her in 2-3 months for follow-up evaluation.  Medication Adjustments/Labs and Tests Ordered: Current medicines are reviewed at length with the patient today.  Concerns regarding medicines are outlined above.  Medication changes, Labs and Tests ordered today are listed in the Patient Instructions below. There are no Patient Instructions on file for this visit. Time spent: 60 minutes Signed, Shelva Majestic, MD , [facility] 06/27/2016 2:29 PM    [facility] [address] [place], [place], [place]  [address] Phone: [phone]

## 2016-06-27 NOTE — Patient Instructions (Signed)
Your physician has recommended you make the following change in your medication:   1.) the furosemide has been changed to holding every 3rd day. If you notice swelling then go back to daily.  2.) the metoprolol has been increased to 75 mg daily. ( 1& 1/2 tablet)  Your physician recommends that you schedule a follow-up appointment in: 2 months.

## 2016-07-09 ENCOUNTER — Ambulatory Visit (INDEPENDENT_AMBULATORY_CARE_PROVIDER_SITE_OTHER): Payer: Medicare Other | Admitting: Cardiovascular Disease

## 2016-07-09 ENCOUNTER — Encounter: Payer: Self-pay | Admitting: Cardiovascular Disease

## 2016-07-09 VITALS — BP 150/88 | HR 86 | Ht 59.0 in | Wt 124.0 lb

## 2016-07-09 DIAGNOSIS — R300 Dysuria: Secondary | ICD-10-CM | POA: Diagnosis not present

## 2016-07-09 DIAGNOSIS — I484 Atypical atrial flutter: Secondary | ICD-10-CM

## 2016-07-09 DIAGNOSIS — Z950 Presence of cardiac pacemaker: Secondary | ICD-10-CM

## 2016-07-09 DIAGNOSIS — I495 Sick sinus syndrome: Secondary | ICD-10-CM

## 2016-07-09 DIAGNOSIS — R350 Frequency of micturition: Secondary | ICD-10-CM | POA: Diagnosis not present

## 2016-07-09 DIAGNOSIS — R3915 Urgency of urination: Secondary | ICD-10-CM | POA: Diagnosis not present

## 2016-07-09 DIAGNOSIS — R2681 Unsteadiness on feet: Secondary | ICD-10-CM

## 2016-07-09 MED ORDER — AMIODARONE HCL 200 MG PO TABS: 200.0000 mg | ORAL_TABLET | Freq: Every day | ORAL | 11 refills | Status: DC

## 2016-07-09 NOTE — Patient Instructions (Signed)
Dr Sallyanne Kuster has recommended making the following medication changes: 1. DECREASE Amiodarone to 200 mg once daily  Your physician recommends that you return for lab work TODAY.  Remote monitoring is used to monitor your Pacemaker of ICD from home. This monitoring reduces the number of office visits required to check your device to one time per year. It allows [place] to keep an eye on the functioning of your device to ensure it is working properly. You are scheduled for a device check from home on Wednesday, July 25th, 2018. You may send your transmission at any time that day. If you have a wireless device, the transmission will be sent automatically. After your physician reviews your transmission, you will receive a postcard with your next transmission date.  Dr Sallyanne Kuster recommends that you schedule a follow-up appointment in 6 months with a pacemaker check. You will receive a reminder letter in the mail two months in advance. If you don't receive a letter, please call our office to schedule the follow-up appointment.  If you need a refill on your cardiac medications before your next appointment, please call your pharmacy.

## 2016-07-09 NOTE — Progress Notes (Signed)
Cardiology Office Note:    Date:  07/09/2016   ID:  Christine Perez, DOB 1925-12-17, MRN [id number]  PCP:  Nyoka Cowden, MD  Cardiologist: Shelva Majestic, M.D.; Sanda Klein, MD     Referring MD: Marletta Lor, MD   Chief Complaint  Patient presents with  . Follow-up    PT c/o SOB since last visit and medicine change    History of Present Illness:    Christine Perez is a 83 y.o. female, previous patient of Dr. Baxter Hire with a hx of Sinus node dysfunction and paroxysmal atrial arrhythmia, recently transferring her care to Dr. Ellouise Newer, here to establish pacemaker clinic follow-up. At her last appointment with Dr. Claiborne Billings on March 21 she was found to be in sustained atrial flutter and we were unsuccessful in attempts at performing overdrive pacing. We increased the dose of amiodarone with plans to attempt repeat overdrive pacing today.  After those medicine changes she has not really noticed any improvement in her symptoms. If anything her breathing may be a little more difficult. She denies edema, orthopnea, PND and is not aware of palpitations. Thankfully she has not had any more falls (she had a serious fall in her bathtub last December. She does feel very unsteady and uses either a cane or walker for support.  She has a dual-chamber [employer] advise a device implanted in 2015 that still has roughly 6 years of anticipated generator longevity. Lead parameters are normal.   Pacemaker interrogation shows that overall the burden of atrial tachycardia/atrial flutter has been about 73% since her last device check. She has not had high ventricular rates. She was in a slow atrial flutter with a cycle length of about 320 ms with 2:1 AV conduction today. Otherwise she has roughly 10% atrial pacing and only 14% ventricular pacing. Activity level is stable at around 1 hour per day.  We repeated attempts at overdrive pacing of her arrhythmia. This was tried multiple times with cycle  lengths gradually decreasing from 300 ms all the way down to 200 ms. I was never convinced that there was evidence of entrainment. On one occasion the atrial arrhythmia did appear to briefly change it's cycle length, then return to baseline morphology.  Again, the impression is that she has an automatic atrial tachycardia or possibly left atrial flutter.  She complains of frequency, urgency and dysuria and wonders whether she might have a urinary tract infection. She has had numerous problems with this in the past. She has not recently been treated for urinary infection.  Past Medical History:  Diagnosis Date  . Atrial fibrillation ([place])   . CARDIAC ARRHYTHMIA 01/08/2010  . COLONIC POLYPS, HX OF 10/09/2006  . COPD (chronic obstructive pulmonary disease) ([place])   . HYPERLIPIDEMIA 10/09/2006  . HYPERTENSION 10/09/2006  . HYPOTHYROIDISM 10/09/2006  . LASSITUDE 08/15/2009  . OSTEOPOROSIS 10/09/2006  . VERTIGO 08/07/2009  . Wears glasses   . Wears hearing aid    left    Past Surgical History:  Procedure Laterality Date  . ABDOMINAL HYSTERECTOMY    . APPENDECTOMY    . BREAST SURGERY     left - lumpectomy  . CARDIOVERSION N/A 08/09/2013   Procedure: CARDIOVERSION;  Surgeon: Candee Furbish, MD;  Location: [facility] ENDOSCOPY;  Service: Cardiovascular;  Laterality: N/A;  . CATARACT EXTRACTION    . CHOLECYSTECTOMY    . PACEMAKER PLACEMENT N/A 12/05/2013  . PARTIAL MASTECTOMY WITH NEEDLE LOCALIZATION Left 06/29/2012   Procedure: PARTIAL MASTECTOMY WITH NEEDLE LOCALIZATION;  Surgeon: Adin Hector, MD;  Location: [facility];  Service: General;  Laterality: Left;  . TONSILLECTOMY      Current Medications: No outpatient prescriptions have been marked as taking for the 07/09/16 encounter (Office Visit) with Sanda Klein, MD.     Allergies:   Codeine   Social History   Social History  . Marital status: Widowed    Spouse name: N/A  . Number of children: N/A  . Years of education: N/A     Social History Main Topics  . Smoking status: Never Smoker  . Smokeless tobacco: Never Used  . Alcohol use No  . Drug use: No  . Sexual activity: Not Asked   Other Topics Concern  . None   Social History Narrative  . None     ROS:   Please see the history of present illness.     All other systems reviewed and are negative.   EKGs/Labs/Other Studies Reviewed:    EKG:  EKG is not ordered today.  The intracardiac electrogram demonstrates atrial flutter with 2:1 native AV conduction  Recent Labs: 03/02/2016: B Natriuretic Peptide 430.3 06/26/2016: ALT 27; BUN 13; Creat 1.06; Hemoglobin 13.0; Magnesium 2.1; Platelets 121; Potassium 4.9; Sodium 143; TSH 3.81   Recent Lipid Panel    Component Value Date/Time   CHOL 191 06/26/2016 0951   TRIG 90 06/26/2016 0951   HDL 71 06/26/2016 0951   CHOLHDL 2.7 06/26/2016 0951   VLDL 18 06/26/2016 0951   LDLCALC 102 (H) 06/26/2016 0951   LDLDIRECT 133.2 12/21/2012 1207    Physical Exam:    VS:  BP (!) 150/88 (BP Location: Left Arm, Patient Position: Sitting, Cuff Size: Normal)   Pulse 86   Ht 4\' 11"  (1.499 m)   Wt 56.2 kg (124 lb)   BMI 25.04 kg/m     Wt Readings from Last 3 Encounters:  07/09/16 56.2 kg (124 lb)  06/27/16 56.7 kg (125 lb)  06/04/16 55.4 kg (122 lb 3.2 oz)     GEN: She is elderly and appears thinned and a little frail, Well nourished, well developed in no acute distress HEENT: Normal NECK: No JVD; No carotid bruits LYMPHATICS: No lymphadenopathy CARDIAC: RRR, no murmurs, rubs, gallops, healthy left subclavian pacemaker site RESPIRATORY:  Clear to auscultation without rales, wheezing or rhonchi  ABDOMEN: Soft, non-tender, non-distended MUSCULOSKELETAL:  No edema; No deformity  SKIN: Warm and dry NEUROLOGIC:  Alert and oriented x 3 PSYCHIATRIC:  Normal affect   ASSESSMENT:    1. Atypical atrial flutter ([place])   2. SSS (sick sinus syndrome) ([place])   3. Pacemaker   4. Unsteady gait   5. Dysuria   6.  Urinary frequency    PLAN:    In order of problems listed above:  1. AFlutter: Unable to terminate using overdrive pacing, even though she is now on a relatively high dose of amiodarone. Will decrease the dose of amiodarone back to her usual maintenance dose. If it appears that the current arrhythmia is becoming a permanent 1, might be worthwhile stopping amiodarone altogether due to its high risk of toxicity. We could then just provide rate control with beta blockers. The arrhythmia does not appear to be particularly symptomatic. I don't think at this point there is a reason to consider EP referral or ablation. 2. SSS: Satisfactory treatment with current pacemaker settings. 3. PM: Normal device function. Enroll in our remote device clinic. 4. Unsteady gait: Her risk of complications with falls is  considerable since she takes an anticoagulant. I have recommended that she consider physical therapy evaluation and if appropriate gait training. 5. Possible UTI: We'll check a urinalysis and give her antibiotics if there is evidence suggesting urinary tract infection.     Medication Adjustments/Labs and Tests Ordered: Current medicines are reviewed at length with the patient today.  Concerns regarding medicines are outlined above. Labs and tests ordered and medication changes are outlined in the patient instructions below:  Patient Instructions  Dr Sallyanne Kuster has recommended making the following medication changes: 1. DECREASE Amiodarone to 200 mg once daily  Your physician recommends that you return for lab work TODAY.  Remote monitoring is used to monitor your Pacemaker of ICD from home. This monitoring reduces the number of office visits required to check your device to one time per year. It allows [place] to keep an eye on the functioning of your device to ensure it is working properly. You are scheduled for a device check from home on Wednesday, July 25th, 2018. You may send your transmission at any  time that day. If you have a wireless device, the transmission will be sent automatically. After your physician reviews your transmission, you will receive a postcard with your next transmission date.  Dr Sallyanne Kuster recommends that you schedule a follow-up appointment in 6 months with a pacemaker check. You will receive a reminder letter in the mail two months in advance. If you don't receive a letter, please call our office to schedule the follow-up appointment.  If you need a refill on your cardiac medications before your next appointment, please call your pharmacy.    Signed, Sanda Klein, MD  07/09/2016 4:57 PM    [place]

## 2016-07-10 ENCOUNTER — Other Ambulatory Visit: Payer: Self-pay | Admitting: Cardiovascular Disease

## 2016-07-10 LAB — URINE CULTURE

## 2016-07-10 LAB — URINALYSIS
BILIRUBIN URINE: NEGATIVE
GLUCOSE, UA: NEGATIVE
Hgb urine dipstick: NEGATIVE
Ketones, ur: NEGATIVE
Leukocytes, UA: NEGATIVE
Nitrite: NEGATIVE
PROTEIN: NEGATIVE
SPECIFIC GRAVITY, URINE: 1.008 (ref 1.001–1.035)
pH: 6.5 (ref 5.0–8.0)

## 2016-08-01 ENCOUNTER — Encounter (HOSPITAL_COMMUNITY): Payer: Self-pay | Admitting: Emergency Medicine

## 2016-08-01 ENCOUNTER — Emergency Department (HOSPITAL_COMMUNITY)
Admission: EM | Admit: 2016-08-01 | Discharge: 2016-08-01 | Disposition: A | Discharge status: Home / Self Care | Payer: Medicare Other | Attending: Emergency Medicine | Admitting: Emergency Medicine

## 2016-08-01 ENCOUNTER — Emergency Department (HOSPITAL_COMMUNITY): Payer: Medicare Other

## 2016-08-01 ENCOUNTER — Telehealth: Payer: Self-pay | Admitting: Cardiovascular Disease

## 2016-08-01 DIAGNOSIS — I110 Hypertensive heart disease with heart failure: Secondary | ICD-10-CM | POA: Insufficient documentation

## 2016-08-01 DIAGNOSIS — I5033 Acute on chronic diastolic (congestive) heart failure: Secondary | ICD-10-CM | POA: Insufficient documentation

## 2016-08-01 DIAGNOSIS — R0609 Other forms of dyspnea: Secondary | ICD-10-CM

## 2016-08-01 DIAGNOSIS — J449 Chronic obstructive pulmonary disease, unspecified: Secondary | ICD-10-CM | POA: Insufficient documentation

## 2016-08-01 DIAGNOSIS — Z7901 Long term (current) use of anticoagulants: Secondary | ICD-10-CM | POA: Diagnosis not present

## 2016-08-01 DIAGNOSIS — R531 Weakness: Secondary | ICD-10-CM | POA: Insufficient documentation

## 2016-08-01 DIAGNOSIS — E039 Hypothyroidism, unspecified: Secondary | ICD-10-CM | POA: Insufficient documentation

## 2016-08-01 DIAGNOSIS — R0602 Shortness of breath: Secondary | ICD-10-CM | POA: Insufficient documentation

## 2016-08-01 DIAGNOSIS — Z950 Presence of cardiac pacemaker: Secondary | ICD-10-CM | POA: Diagnosis not present

## 2016-08-01 DIAGNOSIS — Z79899 Other long term (current) drug therapy: Secondary | ICD-10-CM | POA: Diagnosis not present

## 2016-08-01 DIAGNOSIS — R404 Transient alteration of awareness: Secondary | ICD-10-CM | POA: Diagnosis not present

## 2016-08-01 LAB — I-STAT TROPONIN, ED: TROPONIN I, POC: 0.01 ng/mL (ref 0.00–0.08)

## 2016-08-01 LAB — CBC WITH DIFFERENTIAL/PLATELET
BASOS ABS: 0 10*3/uL (ref 0.0–0.1)
BASOS PCT: 0 %
EOS ABS: 0 10*3/uL (ref 0.0–0.7)
Eosinophils Relative: 1 %
HCT: 37.7 % (ref 36.0–46.0)
Hemoglobin: 11.6 g/dL — ABNORMAL LOW (ref 12.0–15.0)
Lymphocytes Relative: 19 %
Lymphs Abs: 0.9 10*3/uL (ref 0.7–4.0)
MCH: 30.6 pg (ref 26.0–34.0)
MCHC: 30.8 g/dL (ref 30.0–36.0)
MCV: 99.5 fL (ref 78.0–100.0)
MONO ABS: 0.5 10*3/uL (ref 0.1–1.0)
Monocytes Relative: 11 %
Neutro Abs: 3.2 10*3/uL (ref 1.7–7.7)
Neutrophils Relative %: 69 %
Platelets: 111 10*3/uL — ABNORMAL LOW (ref 150–400)
RBC: 3.79 MIL/uL — ABNORMAL LOW (ref 3.87–5.11)
RDW: 15.2 % (ref 11.5–15.5)
WBC: 4.6 10*3/uL (ref 4.0–10.5)

## 2016-08-01 LAB — URINALYSIS, ROUTINE W REFLEX MICROSCOPIC
Bilirubin Urine: NEGATIVE
GLUCOSE, UA: NEGATIVE mg/dL
Hgb urine dipstick: NEGATIVE
KETONES UR: NEGATIVE mg/dL
Nitrite: NEGATIVE
PROTEIN: NEGATIVE mg/dL
Specific Gravity, Urine: 1.016 (ref 1.005–1.030)
pH: 6 (ref 5.0–8.0)

## 2016-08-01 LAB — I-STAT CHEM 8, ED
BUN: 19 mg/dL (ref 6–20)
CHLORIDE: 105 mmol/L (ref 101–111)
CREATININE: 1 mg/dL (ref 0.44–1.00)
Calcium, Ion: 1.27 mmol/L (ref 1.15–1.40)
GLUCOSE: 104 mg/dL — AB (ref 65–99)
HCT: 34 % — ABNORMAL LOW (ref 36.0–46.0)
Hemoglobin: 11.6 g/dL — ABNORMAL LOW (ref 12.0–15.0)
Potassium: 4.4 mmol/L (ref 3.5–5.1)
Sodium: 142 mmol/L (ref 135–145)
TCO2: 26 mmol/L (ref 0–100)

## 2016-08-01 LAB — BRAIN NATRIURETIC PEPTIDE: B NATRIURETIC PEPTIDE 5: 508.3 pg/mL — AB (ref 0.0–100.0)

## 2016-08-01 MED ORDER — CEPHALEXIN 500 MG PO CAPS: 500.0000 mg | ORAL_CAPSULE | Freq: Two times a day (BID) | ORAL | 0 refills | Status: AC

## 2016-08-01 MED ORDER — FUROSEMIDE 20 MG PO TABS: 20.0000 mg | ORAL_TABLET | Freq: Every day | ORAL | 0 refills | Status: DC

## 2016-08-01 MED ORDER — DEXTROSE 5 % IV SOLN
1.0000 g | Freq: Once | INTRAVENOUS | Status: AC
  Administered 2016-08-01: 1 g via INTRAVENOUS
  Filled 2016-08-01: qty 10

## 2016-08-01 NOTE — Telephone Encounter (Signed)
S/w kelly's nurse she state to send pt to ER-ok

## 2016-08-01 NOTE — Telephone Encounter (Signed)
Christine Perez daughter will take pt to the ER to cardioversion

## 2016-08-01 NOTE — Telephone Encounter (Signed)
°  New Prob   Pt c/o swelling: STAT is pt has developed SOB within 24 hours  1. How long have you been experiencing swelling? Worsened over last 2 days  2. Where is the swelling located? L foot  3.  Are you currently taking a "fluid pill"? Yes  4.  Are you currently SOB? SOB and wheezing  5.  Have you traveled recently? No   Weight is up to 127 which daughter states is typical for her when she is fluid overloaded.

## 2016-08-01 NOTE — Telephone Encounter (Signed)
S/w Oralia Manis states that she thinks that she thinks that pt is in AFIB

## 2016-08-01 NOTE — ED Triage Notes (Signed)
Patient arrived to ED via [facility]. EMS reports: Patient from home. Lives with daughter. Reports that she has had weakness x ? months, but experienced worsening weakness this morning, along with shortness of breath. Denies pain.  BP 130/88, Pulse 80s. 96% on room air.  Patient has A Fib.

## 2016-08-01 NOTE — Telephone Encounter (Signed)
Appt scheduled for Monday [discuss ER/AFIB

## 2016-08-01 NOTE — Discharge Instructions (Signed)
You were seen in the ED today with difficulty breathing and weakness. Your labs, x-ray, and EKG look normal. We are having you take Lasix every day over the weekend and will have you see the Cardiologist on Monday. We are also treating you for a urinary tract infection which we found today.   Return to the ED immediately with any chest pain, difficulty breathing, fever, chills, abdominal pain, or other symptoms concerning for you.

## 2016-08-01 NOTE — Telephone Encounter (Signed)
Returning your call. °

## 2016-08-01 NOTE — ED Provider Notes (Signed)
[facility] DEPT Provider Note   CSN: [id number] Arrival date & time: 08/01/16  1802     History   Chief Complaint Chief Complaint  Patient presents with  . Weakness    HPI Christine Perez is a 83 y.o. female with a history of atrial fibrillation on Xarelto and CHF with pacemaker placement presenting with a gradually progressive generalized weakness in association with shortness of breath with exertion and a noted 12 pound weight gain since her last admission for CHF 6 months ago.  She has noted increasing dependent ankle and foot edema this week.  She contacted her cardiologist Dr. Claiborne Billings was advised to present here for further evaluation.  She denies chest pain, orthopnea.  She states lifting her hands over her head makes her short of breath, giving the example that tried to put her dishes in her cupboard makes her winded.  She is currently taking Lasix every other day, stating there is concern over kidney problems when she was taking this medicine daily.  She denies fevers, chills, increased cough and has had no recent illnesses.    The history is provided by the patient and a relative.    Past Medical History:  Diagnosis Date  . Atrial fibrillation ([place])   . CARDIAC ARRHYTHMIA 01/08/2010  . COLONIC POLYPS, HX OF 10/09/2006  . COPD (chronic obstructive pulmonary disease) ([place])   . HYPERLIPIDEMIA 10/09/2006  . HYPERTENSION 10/09/2006  . HYPOTHYROIDISM 10/09/2006  . LASSITUDE 08/15/2009  . OSTEOPOROSIS 10/09/2006  . VERTIGO 08/07/2009  . Wears glasses   . Wears hearing aid    left    Patient Active Problem List   Diagnosis Date Noted  . Acute on chronic diastolic heart failure ([place]) 03/18/2016  . CHF exacerbation ([place]) 03/02/2016  . COPD with acute exacerbation ([place]) 03/02/2016  . Hypertensive urgency 03/02/2016  . Pleural effusion 03/02/2016  . SSS (sick sinus syndrome) ([place]) 03/02/2016  . CHF (congestive heart failure) ([place]) 03/02/2016  . Chronic anticoagulation  08/03/2013  . Atrial fibrillation ([place]) 08/02/2013  . Lobular carcinoma in situ of left breast 06/03/2012  . LASSITUDE 08/15/2009  . VERTIGO 08/07/2009  . Hypothyroidism 10/09/2006  . Dyslipidemia 10/09/2006  . Essential hypertension 10/09/2006  . Osteoporosis 10/09/2006  . History of colonic polyps 10/09/2006    Past Surgical History:  Procedure Laterality Date  . ABDOMINAL HYSTERECTOMY    . APPENDECTOMY    . BREAST SURGERY     left - lumpectomy  . CARDIOVERSION N/A 08/09/2013   Procedure: CARDIOVERSION;  Surgeon: Candee Furbish, MD;  Location: [facility] ENDOSCOPY;  Service: Cardiovascular;  Laterality: N/A;  . CATARACT EXTRACTION    . CHOLECYSTECTOMY    . PACEMAKER PLACEMENT N/A 12/05/2013  . PARTIAL MASTECTOMY WITH NEEDLE LOCALIZATION Left 06/29/2012   Procedure: PARTIAL MASTECTOMY WITH NEEDLE LOCALIZATION;  Surgeon: Adin Hector, MD;  Location: [facility];  Service: General;  Laterality: Left;  . TONSILLECTOMY      OB History    No data available       Home Medications    Prior to Admission medications   Medication Sig Start Date End Date Taking? Authorizing Provider  amiodarone (PACERONE) 200 MG tablet Take 1 tablet (200 mg total) by mouth daily. 07/09/16   Croitoru, Mihai, MD  calcium-vitamin D (OSCAL WITH D) 500-200 MG-UNIT per tablet Take 1 tablet by mouth 2 (two) times daily.     [provider]  furosemide (LASIX) 20 MG tablet 20 mg Monday, Wednesday and Friday only  Patient taking differently: Take 40 mg by mouth. 40mg  takes tuesday, thursday and saturday 04/17/16   Marletta Lor, MD  ipratropium (ATROVENT HFA) 17 MCG/ACT inhaler Inhale 2 puffs into the lungs every 4 (four) hours as needed for wheezing. 03/18/16   Marletta Lor, MD  losartan (COZAAR) 100 MG tablet TAKE ONE TABLET BY MOUTH ONCE DAILY 03/12/16   Marletta Lor, MD  metoprolol succinate (TOPROL-XL) 50 MG 24 hr tablet Take 1.5 tablets ( 75 mg) daily. 06/27/16   Troy Sine, MD  Multiple Vitamin (MULTIVITAMIN) tablet Take 1 tablet by mouth daily.    [provider]  potassium chloride SA (K-DUR,KLOR-CON) 20 MEQ tablet Take 20 mEq by mouth 2 (two) times daily.    [provider]  Rivaroxaban (XARELTO) 15 MG TABS tablet Take 1 tablet (15 mg total) by mouth daily with supper. 05/08/16 07/07/16  Marletta Lor, MD  SYNTHROID 75 MCG tablet TAKE ONE TABLET BY MOUTH ONCE DAILY 02/05/16   Marletta Lor, MD  vitamin C (ASCORBIC ACID) 500 MG tablet Take 500 mg by mouth daily.    [provider]  zolpidem (AMBIEN) 5 MG tablet TAKE ONE TABLET BY MOUTH AT BEDTIME 04/09/16   Marletta Lor, MD    Family History No family history on file.  Social History Social History  Substance Use Topics  . Smoking status: Never Smoker  . Smokeless tobacco: Never Used  . Alcohol use No     Allergies   Codeine   Review of Systems Review of Systems  Constitutional: Positive for fatigue. Negative for fever.  HENT: Negative for congestion and sore throat.   Eyes: Negative.   Respiratory: Positive for shortness of breath. Negative for chest tightness.   Cardiovascular: Positive for leg swelling. Negative for chest pain and palpitations.  Gastrointestinal: Negative for abdominal pain and nausea.  Genitourinary: Negative.   Musculoskeletal: Negative for arthralgias, joint swelling and neck pain.  Skin: Negative.  Negative for rash and wound.  Neurological: Positive for weakness. Negative for dizziness, light-headedness, numbness and headaches.  Psychiatric/Behavioral: Negative.      Physical Exam Updated Vital Signs BP (!) 166/95 (BP Location: Right Arm)   Pulse 80   Temp 98.7 F (37.1 C) (Oral)   Resp (!) 23   SpO2 98%   Physical Exam  Constitutional: She appears well-developed and well-nourished.  HENT:  Head: Normocephalic and atraumatic.  Eyes: Conjunctivae are normal.  Neck: Normal range of motion.    Cardiovascular: Normal rate, regular rhythm, normal heart sounds and intact distal pulses.   Pulmonary/Chest: Effort normal and breath sounds normal. No respiratory distress. She has no decreased breath sounds. She has no wheezes. She has no rhonchi. She exhibits no tenderness.  Faint rales right base.  Abdominal: Soft. Bowel sounds are normal. She exhibits no mass. There is no tenderness. There is no guarding.  Musculoskeletal: Normal range of motion.  No pitting ankle or foot edema.  Neurological: She is alert.  Skin: Skin is warm and dry.  Psychiatric: She has a normal mood and affect.  Nursing note and vitals reviewed.    ED Treatments / Results  Labs (all labs ordered are listed, but only abnormal results are displayed) Labs Reviewed  URINALYSIS, ROUTINE W REFLEX MICROSCOPIC - Abnormal; Notable for the following:       Result Value   APPearance HAZY (*)    Leukocytes, UA MODERATE (*)    Bacteria, UA FEW (*)  Squamous Epithelial / LPF 0-5 (*)    All other components within normal limits  I-STAT CHEM 8, ED - Abnormal; Notable for the following:    Glucose, Bld 104 (*)    Hemoglobin 11.6 (*)    HCT 34.0 (*)    All other components within normal limits  CBC WITH DIFFERENTIAL/PLATELET  BRAIN NATRIURETIC PEPTIDE  I-STAT TROPOININ, ED    EKG  EKG Interpretation None       Radiology Dg Chest 2 View  Result Date: 08/01/2016 CLINICAL DATA:  Progressive weakness beginning today. Shortness of breath. EXAM: CHEST  2 VIEW COMPARISON:  One-view chest x-ray 03/02/2016 FINDINGS: The heart is enlarged. Atherosclerotic calcifications are present at the aortic arch. There is no edema or effusion. Previously noted airspace disease has cleared. Changes of COPD are stable. Exaggerated thoracic kyphosis is again noted. IMPRESSION: 1. Cardiomegaly without failure. 2. Aortic atherosclerosis. 3. COPD. Electronically Signed   By: San Morelle M.D.   On: 08/01/2016 19:29     Procedures Procedures (including critical care time)  Medications Ordered in ED Medications - No data to display   Initial Impression / Assessment and Plan / ED Course  I have reviewed the triage vital signs and the nursing notes.  Pertinent labs & imaging results that were available during my care of the patient were reviewed by me and considered in my medical decision making (see chart for details).     Pt with history of chf and current sx suggesting chf exacerbation.  Pending labs at this time,  Pt's cxr does not reflect fluid overload. Discussed with Dr. Laverta Baltimore who will follow and dispo patient.  Final Clinical Impressions(s) / ED Diagnoses   Final diagnoses:  None    New Prescriptions New Prescriptions   No medications on file     Landis Martins 08/01/16 2058    Margette Fast, MD 08/01/16 2332

## 2016-08-01 NOTE — Telephone Encounter (Signed)
S/w [name] Pedigo,(EC) she states that her mother has increased swelling in LLE in ankle  and SOB and wheezing. She states that BP 168/102 HR 108 (she will see if this is  irregular and CB) pt is up 5# and takes lasix 40mg  EOD. She states that she took yesterday and she will take extra 20mg  lasix today and then 40mg  as scheduled tomorrow. She will call Sunday or Monday to update on swelling over the weekend. Informed to go to the ER if swelling/SOB worsens or any other sx develop she will take pt to the er.  Daughter is worried about swelling but BP is running 168/102 HR 108.

## 2016-08-04 ENCOUNTER — Ambulatory Visit (INDEPENDENT_AMBULATORY_CARE_PROVIDER_SITE_OTHER): Payer: Medicare Other | Admitting: Cardiovascular Disease

## 2016-08-04 ENCOUNTER — Encounter: Payer: Self-pay | Admitting: Cardiovascular Disease

## 2016-08-04 VITALS — BP 183/98 | HR 74 | Ht 59.0 in | Wt 127.8 lb

## 2016-08-04 DIAGNOSIS — I484 Atypical atrial flutter: Secondary | ICD-10-CM | POA: Diagnosis not present

## 2016-08-04 DIAGNOSIS — Z7901 Long term (current) use of anticoagulants: Secondary | ICD-10-CM | POA: Diagnosis not present

## 2016-08-04 DIAGNOSIS — N390 Urinary tract infection, site not specified: Secondary | ICD-10-CM | POA: Diagnosis not present

## 2016-08-04 DIAGNOSIS — I495 Sick sinus syndrome: Secondary | ICD-10-CM | POA: Diagnosis not present

## 2016-08-04 DIAGNOSIS — Z950 Presence of cardiac pacemaker: Secondary | ICD-10-CM

## 2016-08-04 DIAGNOSIS — I10 Essential (primary) hypertension: Secondary | ICD-10-CM | POA: Diagnosis not present

## 2016-08-04 DIAGNOSIS — M25472 Effusion, left ankle: Secondary | ICD-10-CM

## 2016-08-04 MED ORDER — AMIODARONE HCL 200 MG PO TABS: 300.0000 mg | ORAL_TABLET | Freq: Every day | ORAL | 6 refills | Status: AC

## 2016-08-04 MED ORDER — METOPROLOL SUCCINATE ER 50 MG PO TB24: 50.0000 mg | ORAL_TABLET | Freq: Two times a day (BID) | ORAL | 6 refills | Status: DC

## 2016-08-04 NOTE — Progress Notes (Signed)
Cardiology Office Note    Date:  08/06/2016   ID:  Linda, Grimmer Jun 03, 1925, MRN [id number]  PCP:  Marletta Lor, MD  Cardiologist:  Shelva Majestic, MD   No chief complaint on file.   History of Present Illness:  DESHAY KIRSTEIN is a 83 y.o. female who is a former Cardiology patient of Dr. Elonda Husky who is no longer in practice in [facility].  She established cardiology care with me on 06/05/2016.  She presents for a 6 weeks follow up evaluation.  Ms. Bobb has a history of sick sinus syndrome as well as paroxysmal atrial fibrillation and atrial flutter and underwent insertion of a Medtronic advisa MRI safe pacemaker on 12/05/2013 by Dr. Elonda Husky.  Since he left [facility], she has been seen by Dr. Atilano Median and last saw him in December 2017.  Her primary physician is Dr. Burnice Logan in [place].  She was admitted to the hospital with CHF exacerbation on December 17 and discharged on 03/04/2006.  She had fallen previous month and had not been very active.  She was having dietary indiscretion and was frequently having canned soup with high sodium content.  She was admitted with acute on chronic diastolic CHF.  Troponin was minimally positive at 0.07 and was felt to be consistent with demand ischemia.  During that evaluation.  An echo Doppler studies revealed an EF of 60-65%.  She had normal wall motion.  There was severe left atrial dilatation, severe right atrial dilatation, and mild RV dilatation.  PA pressure was mildly increased at 34 mm.  In that hospital record it states that the patient had chronic atrial fibrillation, but in the record of Dr. Atilano Median.  It states the patient had paroxysmal atrial fibrillation for which she was treated with amiodarone.  Her medications now include amiodarone, but she has only been taking 100 mg daily, furosemide 20 mg on Monday, Wednesday and Friday, which was started after her hospitalization, losartan 100 mg daily, Toprol-XL 50 mg, Xarelto 15  mg, Synthroid 75 g for hypothyroidism, and supplemental potassium.   When I saw her for initial evaluation on 06/05/2016, her ECG suggested atrial flutter.  Her pacemaker was interrogated, which confirmed atrial flutter.  An attempt at antitachycardia pacing by Dr. Sallyanne Kuster was unsuccessful, raising the possibility of atypical flutter.  When I saw her, I recommended that she increase amiodarone and since that time, she has been on 200 mg twice a day.  He has noticed some mild shortness of breath and some trace ankle edema.  She had been taking furosemide every third day at 20 mgs. .  She is on Xarelto 15 mg for anticoagulation.  She is also on losartan 100 mg and Toprol-XL 50 mg for her hypertension.    Since I saw her, she saw Dr. Sallyanne Kuster for pacemaker interrogation.  Overall burden of atrial tachycardia/atrial flutter was 73% since her last device check.  Another attempt at overdrive pacing was unsuccessful.  At time, he recommended that she decrease amiodarone to 200 mg.  Apparently, she has not done this.  Recently presented to the emergency room with generalized weakness and reportedly had a UTI for which she was treated with Rocephin and was recommended that she continue Keflex for 7 days.  She never started the Keflex.  Of note, at that time.  No ECG was done.  She admits to some left ankle edema.  She admits to a 78 pounds waking.  She is vegetarian.  She  denies chest pressure.  She presents for evaluation.  Past Medical History:  Diagnosis Date  . Atrial fibrillation ([place])   . CARDIAC ARRHYTHMIA 01/08/2010  . COLONIC POLYPS, HX OF 10/09/2006  . COPD (chronic obstructive pulmonary disease) ([place])   . HYPERLIPIDEMIA 10/09/2006  . HYPERTENSION 10/09/2006  . HYPOTHYROIDISM 10/09/2006  . LASSITUDE 08/15/2009  . OSTEOPOROSIS 10/09/2006  . VERTIGO 08/07/2009  . Wears glasses   . Wears hearing aid    left    Past Surgical History:  Procedure Laterality Date  . ABDOMINAL HYSTERECTOMY    .  APPENDECTOMY    . BREAST SURGERY     left - lumpectomy  . CARDIOVERSION N/A 08/09/2013   Procedure: CARDIOVERSION;  Surgeon: Candee Furbish, MD;  Location: [facility] ENDOSCOPY;  Service: Cardiovascular;  Laterality: N/A;  . CATARACT EXTRACTION    . CHOLECYSTECTOMY    . PACEMAKER PLACEMENT N/A 12/05/2013  . PARTIAL MASTECTOMY WITH NEEDLE LOCALIZATION Left 06/29/2012   Procedure: PARTIAL MASTECTOMY WITH NEEDLE LOCALIZATION;  Surgeon: Adin Hector, MD;  Location: [place];  Service: General;  Laterality: Left;  . TONSILLECTOMY      Current Medications: Outpatient Medications Prior to Visit  Medication Sig Dispense Refill  . acetaminophen (TYLENOL) 500 MG tablet Take 500 mg by mouth every 6 (six) hours as needed for moderate pain or headache.    . calcium-vitamin D (OSCAL WITH D) 500-200 MG-UNIT per tablet Take 1 tablet by mouth 2 (two) times daily.     . cephALEXin (KEFLEX) 500 MG capsule Take 1 capsule (500 mg total) by mouth 2 (two) times daily. 14 capsule 0  . ipratropium (ATROVENT HFA) 17 MCG/ACT inhaler Inhale 2 puffs into the lungs every 4 (four) hours as needed for wheezing. 1 Inhaler 12  . losartan (COZAAR) 100 MG tablet TAKE ONE TABLET BY MOUTH ONCE DAILY 90 tablet 1  . Multiple Vitamin (MULTIVITAMIN) tablet Take 1 tablet by mouth daily.    Marland Kitchen SYNTHROID 75 MCG tablet TAKE ONE TABLET BY MOUTH ONCE DAILY 90 tablet 1  . vitamin C (ASCORBIC ACID) 500 MG tablet Take 500 mg by mouth daily.    Marland Kitchen zolpidem (AMBIEN) 5 MG tablet TAKE ONE TABLET BY MOUTH AT BEDTIME 30 tablet 5  . metoprolol succinate (TOPROL-XL) 50 MG 24 hr tablet Take 1.5 tablets ( 75 mg) daily. 45 tablet 11  . Rivaroxaban (XARELTO) 15 MG TABS tablet Take 1 tablet (15 mg total) by mouth daily with supper. 30 tablet 1  . amiodarone (PACERONE) 200 MG tablet Take 1 tablet (200 mg total) by mouth daily. (Patient taking differently: Take 200 mg by mouth 2 (two) times daily. ) 60 tablet 11  . furosemide (LASIX) 20 MG tablet  20 mg Monday, Wednesday and Friday only (Patient taking differently: Take 20 mg by mouth every Monday, Wednesday, and Friday. ) 30 tablet 3  . furosemide (LASIX) 20 MG tablet Take 1 tablet (20 mg total) by mouth daily. 3 tablet 0  . potassium chloride SA (K-DUR,KLOR-CON) 20 MEQ tablet Take 20 mEq by mouth 2 (two) times daily.     No facility-administered medications prior to visit.      Allergies:   Codeine   Social History   Social History  . Marital status: Widowed    Spouse name: N/A  . Number of children: N/A  . Years of education: N/A   Social History Main Topics  . Smoking status: Never Smoker  . Smokeless tobacco: Never Used  . Alcohol use  No  . Drug use: No  . Sexual activity: Not Asked   Other Topics Concern  . None   Social History Narrative  . None    She is widowed.  She currently is living with her daughter in [place]..  There is no tobacco history.  She does not drink alcohol. She previously was a self-employed bookkeeper.  She had a BFA at [employer].  Family History:  The patient's family history is not on file.   Family  history is notable in that parents are deceased.  Mother had colon cancer and died at age 69.  Her father had a heart attack and died at age 71., sister and maternal cousin had breast cancer, maternal aunt with ovarian cancer.  She has one son and a daughter.  ROS General: Negative; No fevers, chills, or night sweats;  HEENT: Negative; No changes in vision or hearing, sinus congestion, difficulty swallowing Pulmonary: Negative; No cough, wheezing, shortness of breath, hemoptysis Cardiovascular: See history of present illness, positive for sick sinus syndrome status post pacemaker. GI: Positive for colonic polyps GU: Negative; No dysuria, hematuria, or difficulty voiding Musculoskeletal: Negative; no myalgias, joint pain, or weakness Hematologic/Oncology: Negative; no easy bruising, bleeding Endocrine: Positive for hypothyroidism Neuro:  Negative; no changes in balance, headaches Skin: Negative; No rashes or skin lesions Psychiatric: Negative; No behavioral problems, depression Sleep: Negative; No snoring, daytime sleepiness, hypersomnolence, bruxism, restless legs, hypnogognic hallucinations, no cataplexy Other comprehensive 14 point system review is negative.   PHYSICAL EXAM:   BP (!) 183/98   Pulse 74   Ht _0  (1.499 m)   Wt 127 lb 12.8 oz (58 kg)   BMI 25.81 kg/m    Repeat blood pressure by me 158/88   Wt Readings from Last 3 Encounters:  08/04/16 127 lb 12.8 oz (58 kg)  07/09/16 124 lb (56.2 kg)  06/27/16 125 lb (56.7 kg)    General: Alert, oriented, no distress.  Skin: normal turgor, no rashes, warm and dry HEENT: Normocephalic, atraumatic. Pupils equal round and reactive to light; sclera anicteric; extraocular muscles intact;  Nose without nasal septal hypertrophy Mouth/Parynx benign; Mallinpatti scale 3 Neck: No JVD, no carotid bruits; normal carotid upstroke Lungs: clear to ausculatation and percussion; no wheezing or rales Chest wall: without tenderness to palpitation Heart: PMI not displaced, RRR, s1 s2 normal, 1/6 systolic murmur, no diastolic murmur, no rubs, gallops, thrills, or heaves Abdomen: soft, nontender; no hepatosplenomehaly, BS+; abdominal aorta nontender and not dilated by palpation. Back: no CVA tenderness Pulses 2+ Musculoskeletal: full range of motion, normal strength, no joint deformities Extremities: 1+ left ankle swelling.  no clubbing cyanosis , Homan's sign negative  Neurologic: grossly nonfocal; Cranial nerves grossly wnl Psychologic: Normal mood and affect; normal cognition   Studies/Labs Reviewed:   ECG (independently read by me): AV paced rhythm at 74 bpm  06/28/2026 ECG (independently read by me): Atrial flutter at 87 bpm.  QTC 454 ms.  06/05/2016  ECG (independently read by me): Probable atrial flutter versus atrial tachycardia with ventricular rate at 78  bpm.  In order to further evaluate the probable atrial flutter, the pacemaker was interrogated by Dr. Recardo Evangelist.  This confirmed slow atrial flutter/atrial tachycardia at a cycle length of 600 ms.  Several unsuccessful attempts at antitachycardia pacing with Versed 400> 300> 200 ms were performed without success.  Recent Labs: BMP Latest Ref Rng & Units 08/01/2016 06/26/2016 04/17/2016  Glucose 65 - 99 mg/dL 104(H) 85 99  BUN 6 - 20  mg/dL _0 Creatinine 0.44 - 1.00 mg/dL 1.00 1.06(H) 1.01  Sodium 135 - 145 mmol/L 142 143 142  Potassium 3.5 - 5.1 mmol/L 4.4 4.9 4.4  Chloride 101 - 111 mmol/L 105 105 106  CO2 20 - 31 mmol/L - 30 30  Calcium 8.6 - 10.4 mg/dL - 10.6(H) 9.9     Hepatic Function Latest Ref Rng & Units 06/26/2016 03/04/2016 09/04/2015  Total Protein 6.1 - 8.1 g/dL 7.1 5.4(L) 7.3  Albumin 3.6 - 5.1 g/dL 4.2 2.7(L) 4.4  AST 10 - 35 U/L 30 31 33  ALT 6 - 29 U/L 27 21 37(H)  Alk Phosphatase 33 - 130 U/L 112 95 110  Total Bilirubin 0.2 - 1.2 mg/dL 0.6 0.7 0.4  Bilirubin, Direct 0.0 - 0.3 mg/dL - - -    CBC Latest Ref Rng & Units 08/01/2016 08/01/2016 06/26/2016  WBC 4.0 - 10.5 K/uL - 4.6 3.4(L)  Hemoglobin 12.0 - 15.0 g/dL 11.6(L) 11.6(L) 13.0  Hematocrit 36.0 - 46.0 % 34.0(L) 37.7 41.0  Platelets 150 - 400 K/uL - 111(L) 121(L)   Lab Results  Component Value Date   MCV 99.5 08/01/2016   MCV 95.8 06/26/2016   MCV 97.0 03/03/2016   Lab Results  Component Value Date   TSH 3.81 06/26/2016   No results found for: HGBA1C   BNP    Component Value Date/Time   BNP 508.3 (H) 08/01/2016 1958    ProBNP No results found for: PROBNP   Lipid Panel     Component Value Date/Time   CHOL 191 06/26/2016 0951   TRIG 90 06/26/2016 0951   HDL 71 06/26/2016 0951   CHOLHDL 2.7 06/26/2016 0951   VLDL 18 06/26/2016 0951   LDLCALC 102 (H) 06/26/2016 0951   LDLDIRECT 133.2 12/21/2012 1207     RADIOLOGY: Dg Chest 2 View  Result Date: 08/01/2016 CLINICAL DATA:  Progressive  weakness beginning today. Shortness of breath. EXAM: CHEST  2 VIEW COMPARISON:  One-view chest x-ray 03/02/2016 FINDINGS: The heart is enlarged. Atherosclerotic calcifications are present at the aortic arch. There is no edema or effusion. Previously noted airspace disease has cleared. Changes of COPD are stable. Exaggerated thoracic kyphosis is again noted. IMPRESSION: 1. Cardiomegaly without failure. 2. Aortic atherosclerosis. 3. COPD. Electronically Signed   By: San Morelle M.D.   On: 08/01/2016 19:29     Additional studies/ records that were reviewed today include:  I had previously reviewed the patient's numerous records from [place] [facility], [facility], recent hospitalization, echo Doppler study, and records from Dr. Burnice Logan.  I have reviewed the subsequent records of Dr. Sallyanne Kuster and reviewed her emergency room evaluation  03/03/16 ECHO Study Conclusions  - Left ventricle: The cavity size was normal. Systolic function was   normal. The estimated ejection fraction was in the range of 60%   to 65%. Wall motion was normal; there were no regional wall   motion abnormalities. Left ventricular diastolic function   parameters were normal. - Mitral valve: There was mild regurgitation. - Left atrium: The atrium was severely dilated. - Right ventricle: The cavity size was mildly dilated. Wall   thickness was normal. - Right atrium: The atrium was severely dilated. - Tricuspid valve: There was moderate regurgitation. - Pulmonary arteries: PA peak pressure: 34 mm Hg (S).  ASSESSMENT:    1. Essential hypertension   2. Urinary tract infection without hematuria, site unspecified   3. Sick sinus syndrome ([place])   4. Left ankle swelling  5. Atypical atrial flutter ([place])   6. Pacemaker   7. Chronic anticoagulation      PLAN:  Ms. Covell is an 83 year old female who has a history of paroxysmal atrial fibrillation/atrial flutter and is status post insertion of a  Medtronic Advisa MRI safe pacemaker on 12/05/2013 which was inserted by Dr. Elonda Husky in [facility].  She has been maintained on chronic anticoagulation and currently is receiving Xarelto 15 mg.  She has had some issues with falls and as a result, there has been some discussion concerning the possibility of discontinuing anticoagulation due to fall risk.  She had  been maintained on amiodarone 100 mg daily. When I initially saw her, she had a failed attempt at rapid atrial pacing.  Her amiodarone dose had been increased at that time by me to 2 mg twice a day.  When she saw Dr. Sallyanne Kuster for evaluation on 07/09/2016, and another attempt at overdrive pacing of her arrhythmia.  Also was unsuccessful.  Despite his recommendation to reduce her dose.  She apparently has still been on the 2 mg twice a day.  On her ECG today.  She now is AV pacing and does not appear to be in atrial flutter as was noted previously.  Her blood pressure is elevated.  I will only slightly decrease amiodarone to 300 mg I will further titrate Toprol-XL 250 mg twice a day from her present dose of 75 mg daily.  I've also recommended she increase furosemide and take this every other day rather than 3 days per week.  On Synthroid for hypothyroidism.  There is no bleeding on Xarelto for anticoagulation.  She continues to be on losartan for additional blood pressure control.  She recently had a UTI and received 1 dose of Rocephin in the emergency room and was given a prescription for Keflex, which she has not used.  I will check a urinalysis and if this is positive, she will take the Keflex as previously prescribed; however, if negative, she will not take this drug.  I will see her in 2 months for reevaluation.   Medication Adjustments/Labs and Tests Ordered: Current medicines are reviewed at length with the patient today.  Concerns regarding medicines are outlined above.  Medication changes, Labs and Tests ordered today are listed in the Patient  Instructions below. Patient Instructions  Your physician has recommended you make the following change in your medication:   1.) the metoprolol succ has been changed to 50 mg twice a day.  2.) the amiodarone has been changed to 300 mg daily ( 1& 1/2 tablet)  3.) the furosemide has been changed to 1 tablet EVERY OTHER DAY.  Your physician has ordered a urinalysis to check your urine for infection.  Your physician recommends that you schedule a follow-up appointment in: 2 months.     Signed, Shelva Majestic, MD , [facility] 08/06/2016 7:36 PM    [place] [address] [place], [place], [place]  [address] Phone: [phone]

## 2016-08-04 NOTE — Patient Instructions (Signed)
Your physician has recommended you make the following change in your medication:   1.) the metoprolol succ has been changed to 50 mg twice a day.  2.) the amiodarone has been changed to 300 mg daily ( 1& 1/2 tablet)  3.) the furosemide has been changed to 1 tablet EVERY OTHER DAY.  Your physician has ordered a urinalysis to check your urine for infection.  Your physician recommends that you schedule a follow-up appointment in: 2 months.

## 2016-08-05 LAB — URINALYSIS, ROUTINE W REFLEX MICROSCOPIC
BILIRUBIN UA: NEGATIVE
Glucose, UA: NEGATIVE
Ketones, UA: NEGATIVE
Leukocytes, UA: NEGATIVE
Nitrite, UA: NEGATIVE
PH UA: 5.5 (ref 5.0–7.5)
PROTEIN UA: NEGATIVE
RBC UA: NEGATIVE
SPEC GRAV UA: 1.016 (ref 1.005–1.030)
Urobilinogen, Ur: 0.2 mg/dL (ref 0.2–1.0)

## 2016-08-08 ENCOUNTER — Other Ambulatory Visit: Payer: Self-pay | Admitting: Internal Medicine

## 2016-08-12 ENCOUNTER — Ambulatory Visit (INDEPENDENT_AMBULATORY_CARE_PROVIDER_SITE_OTHER): Payer: Medicare Other | Admitting: Internal Medicine

## 2016-08-12 ENCOUNTER — Encounter: Payer: Self-pay | Admitting: Internal Medicine

## 2016-08-12 VITALS — BP 142/86 | HR 83 | Temp 97.5°F | Ht 59.0 in | Wt 126.8 lb

## 2016-08-12 DIAGNOSIS — I10 Essential (primary) hypertension: Secondary | ICD-10-CM

## 2016-08-12 DIAGNOSIS — I5033 Acute on chronic diastolic (congestive) heart failure: Secondary | ICD-10-CM

## 2016-08-12 DIAGNOSIS — I160 Hypertensive urgency: Secondary | ICD-10-CM

## 2016-08-12 DIAGNOSIS — H3412 Central retinal artery occlusion, left eye: Secondary | ICD-10-CM | POA: Diagnosis not present

## 2016-08-12 DIAGNOSIS — H348122 Central retinal vein occlusion, left eye, stable: Secondary | ICD-10-CM | POA: Diagnosis not present

## 2016-08-12 DIAGNOSIS — H4720 Unspecified optic atrophy: Secondary | ICD-10-CM | POA: Diagnosis not present

## 2016-08-12 DIAGNOSIS — H35352 Cystoid macular degeneration, left eye: Secondary | ICD-10-CM | POA: Diagnosis not present

## 2016-08-12 DIAGNOSIS — I480 Paroxysmal atrial fibrillation: Secondary | ICD-10-CM | POA: Diagnosis not present

## 2016-08-12 MED ORDER — SYNTHROID 75 MCG PO TABS: 75.0000 ug | ORAL_TABLET | Freq: Every day | ORAL | 1 refills | Status: DC

## 2016-08-12 MED ORDER — FUROSEMIDE 20 MG PO TABS: 20.0000 mg | ORAL_TABLET | Freq: Every day | ORAL | 3 refills | Status: AC

## 2016-08-12 NOTE — Progress Notes (Signed)
Subjective:    Patient ID: Rudolpho Perez, female    DOB: 1925/12/18, 83 y.o.   MRN: [id number]  HPI  83 year old patient who has a history of essential hypertension.  She has been treated for hypertensive urgencies and also has a history of acute on chronic diastolic heart failure.  She has paroxysmal atrial fibrillation and is on anticoagulation.   She was seen by cardiology recently with elevated blood pressure readings.  Prior to this visit.  She was on furosemide 20 mg Monday, Wednesday, Friday 3 times per week.  For several days.  She was placed on daily furosemide which she tolerated well.  Past Medical History:  Diagnosis Date  . Atrial fibrillation ([place])   . CARDIAC ARRHYTHMIA 01/08/2010  . COLONIC POLYPS, HX OF 10/09/2006  . COPD (chronic obstructive pulmonary disease) ([place])   . HYPERLIPIDEMIA 10/09/2006  . HYPERTENSION 10/09/2006  . HYPOTHYROIDISM 10/09/2006  . LASSITUDE 08/15/2009  . OSTEOPOROSIS 10/09/2006  . VERTIGO 08/07/2009  . Wears glasses   . Wears hearing aid    left     Social History   Social History  . Marital status: Widowed    Spouse name: N/A  . Number of children: N/A  . Years of education: N/A   Occupational History  . Not on file.   Social History Main Topics  . Smoking status: Never Smoker  . Smokeless tobacco: Never Used  . Alcohol use No  . Drug use: No  . Sexual activity: Not on file   Other Topics Concern  . Not on file   Social History Narrative  . No narrative on file    Past Surgical History:  Procedure Laterality Date  . ABDOMINAL HYSTERECTOMY    . APPENDECTOMY    . BREAST SURGERY     left - lumpectomy  . CARDIOVERSION N/A 08/09/2013   Procedure: CARDIOVERSION;  Surgeon: Candee Furbish, MD;  Location: [facility] ENDOSCOPY;  Service: Cardiovascular;  Laterality: N/A;  . CATARACT EXTRACTION    . CHOLECYSTECTOMY    . PACEMAKER PLACEMENT N/A 12/05/2013  . PARTIAL MASTECTOMY WITH NEEDLE LOCALIZATION Left 06/29/2012   Procedure: PARTIAL  MASTECTOMY WITH NEEDLE LOCALIZATION;  Surgeon: Adin Hector, MD;  Location: [place];  Service: General;  Laterality: Left;  . TONSILLECTOMY      No family history on file.  Allergies  Allergen Reactions  . Codeine Shortness Of Breath    Difficulty breathing    Current Outpatient Prescriptions on File Prior to Visit  Medication Sig Dispense Refill  . acetaminophen (TYLENOL) 500 MG tablet Take 500 mg by mouth every 6 (six) hours as needed for moderate pain or headache.    Marland Kitchen amiodarone (PACERONE) 200 MG tablet Take 1.5 tablets (300 mg total) by mouth daily. 45 tablet 6  . calcium-vitamin D (OSCAL WITH D) 500-200 MG-UNIT per tablet Take 1 tablet by mouth 2 (two) times daily.     Marland Kitchen ipratropium (ATROVENT HFA) 17 MCG/ACT inhaler Inhale 2 puffs into the lungs every 4 (four) hours as needed for wheezing. 1 Inhaler 12  . losartan (COZAAR) 100 MG tablet TAKE ONE TABLET BY MOUTH ONCE DAILY 90 tablet 1  . metoprolol succinate (TOPROL-XL) 50 MG 24 hr tablet Take 1 tablet (50 mg total) by mouth 2 (two) times daily. 60 tablet 6  . Multiple Vitamin (MULTIVITAMIN) tablet Take 1 tablet by mouth daily.    . potassium chloride SA (K-DUR,KLOR-CON) 20 MEQ tablet Take 20 mEq by mouth every Monday, Wednesday,  and Friday. Takes 20 meq bid on Mon, Wed and Friday    . vitamin C (ASCORBIC ACID) 500 MG tablet Take 500 mg by mouth daily.    Marland Kitchen zolpidem (AMBIEN) 5 MG tablet TAKE ONE TABLET BY MOUTH AT BEDTIME 30 tablet 5  . Rivaroxaban (XARELTO) 15 MG TABS tablet Take 1 tablet (15 mg total) by mouth daily with supper. 30 tablet 1  . [DISCONTINUED] metoprolol tartrate (LOPRESSOR) 25 MG tablet Take 1 tablet (25 mg total) by mouth 2 (two) times daily. 60 tablet 5   No current facility-administered medications on file prior to visit.     BP (!) 142/86 (BP Location: Left Arm, Patient Position: Sitting, Cuff Size: Normal)   Pulse 83   Temp 97.5 F (36.4 C) (Oral)   Ht 4\' 11"  (1.499 m)   Wt 126 lb  12.8 oz (57.5 kg)   SpO2 96%   BMI 25.61 kg/m      Review of Systems  Constitutional: Negative.   HENT: Negative for congestion, dental problem, hearing loss, rhinorrhea, sinus pressure, sore throat and tinnitus.   Eyes: Negative for pain, discharge and visual disturbance.  Respiratory: Negative for cough and shortness of breath.   Cardiovascular: Positive for leg swelling. Negative for chest pain and palpitations.       CHRONIC LEFT LOWER LEG SWELLING  Gastrointestinal: Negative for abdominal distention, abdominal pain, blood in stool, constipation, diarrhea, nausea and vomiting.  Genitourinary: Negative for difficulty urinating, dysuria, flank pain, frequency, hematuria, pelvic pain, urgency, vaginal bleeding, vaginal discharge and vaginal pain.  Musculoskeletal: Negative for arthralgias, gait problem and joint swelling.  Skin: Negative for rash.  Neurological: Negative for dizziness, syncope, speech difficulty, weakness, numbness and headaches.  Hematological: Negative for adenopathy.  Psychiatric/Behavioral: Negative for agitation, behavioral problems and dysphoric mood. The patient is not nervous/anxious.        Objective:   Physical Exam  Constitutional: She is oriented to person, place, and time. She appears well-developed and well-nourished.  Blood pressure 140/80  HENT:  Head: Normocephalic.  Right Ear: External ear normal.  Left Ear: External ear normal.  Mouth/Throat: Oropharynx is clear and moist.  Eyes: Conjunctivae and EOM are normal. Pupils are equal, round, and reactive to light.  Neck: Normal range of motion. Neck supple. No thyromegaly present.  Cardiovascular: Normal rate, regular rhythm, normal heart sounds and intact distal pulses.   Pulmonary/Chest: Effort normal and breath sounds normal.  Abdominal: Soft. Bowel sounds are normal. She exhibits no mass. There is no tenderness.  Musculoskeletal: Normal range of motion. She exhibits edema.  Very mild edema  involving the left ankle and dorsal aspect of the left foot  Lymphadenopathy:    She has no cervical adenopathy.  Neurological: She is alert and oriented to person, place, and time.  Skin: Skin is warm and dry. No rash noted.  Psychiatric: She has a normal mood and affect. Her behavior is normal.          Assessment & Plan:   Hypertension.  Blood pressure much better controlled today after several days of daily furosemide.  Will continue 20 mg daily Diastolic heart failure, compensated Paroxysmal atrial fibrillation.  Continue chronic anticoagulation History of hypokalemia  Patient has a follow-up appointment in 2 months.  Will reassess at that time and check electrolytes  Nyoka Cowden

## 2016-08-12 NOTE — Patient Instructions (Addendum)
WE NOW OFFER   [facility] FAST TRACK!!!  SAME DAY Appointments for ACUTE CARE  Such as: Sprains, Injuries, cuts, abrasions, rashes, muscle pain, joint pain, back pain Colds, flu, sore throats, headache, allergies, cough, fever  Ear pain, sinus and eye infections Abdominal pain, nausea, vomiting, diarrhea, upset stomach Animal/insect bites  3 Easy Ways to Schedule: Walk-In Scheduling Call in scheduling Mychart Sign-up: [email or web address]    Limit your sodium (Salt) intake  Increase furosemide to 20 mg daily  Return in 2 months for follow-up As scheduled

## 2016-08-31 ENCOUNTER — Other Ambulatory Visit: Payer: Self-pay | Admitting: Internal Medicine

## 2016-09-12 LAB — CUP PACEART INCLINIC DEVICE CHECK
Battery Remaining Longevity: 75 mo
Brady Statistic AP VP Percent: 7.48 %
Brady Statistic AP VS Percent: 2.14 %
Brady Statistic AS VS Percent: 83.83 %
Brady Statistic RV Percent Paced: 12.9 %
Implantable Lead Location: 753859
Implantable Lead Model: 5076
Lead Channel Impedance Value: 361 Ohm
Lead Channel Impedance Value: 437 Ohm
Lead Channel Impedance Value: 437 Ohm
Lead Channel Pacing Threshold Amplitude: 0.75 V
Lead Channel Sensing Intrinsic Amplitude: 0.375 mV
Lead Channel Sensing Intrinsic Amplitude: 18.875 mV
Lead Channel Sensing Intrinsic Amplitude: 22 mV
Lead Channel Setting Pacing Amplitude: 2 V
Lead Channel Setting Pacing Amplitude: 5 V
Lead Channel Setting Pacing Pulse Width: 0.4 ms
Lead Channel Setting Sensing Sensitivity: 0.9 mV
MDC IDC LEAD LOCATION: 753860
MDC IDC MSMT BATTERY VOLTAGE: 3 V
MDC IDC MSMT LEADCHNL RA IMPEDANCE VALUE: 323 Ohm
MDC IDC MSMT LEADCHNL RA PACING THRESHOLD AMPLITUDE: 0.75 V
MDC IDC MSMT LEADCHNL RA PACING THRESHOLD PULSEWIDTH: 0.4 ms
MDC IDC MSMT LEADCHNL RA SENSING INTR AMPL: 0.5 mV
MDC IDC MSMT LEADCHNL RV PACING THRESHOLD PULSEWIDTH: 0.4 ms
MDC IDC PG IMPLANT DT: 20150921
MDC IDC SESS DTM: 20180425150458
MDC IDC STAT BRADY AS VP PERCENT: 6.55 %
MDC IDC STAT BRADY RA PERCENT PACED: 9.02 %

## 2016-09-15 ENCOUNTER — Other Ambulatory Visit: Payer: Self-pay | Admitting: Internal Medicine

## 2016-09-23 ENCOUNTER — Ambulatory Visit (INDEPENDENT_AMBULATORY_CARE_PROVIDER_SITE_OTHER): Payer: Medicare Other | Admitting: Cardiovascular Disease

## 2016-09-23 ENCOUNTER — Encounter: Payer: Self-pay | Admitting: Cardiovascular Disease

## 2016-09-23 VITALS — BP 158/90 | HR 84 | Ht <= 58 in | Wt 128.0 lb

## 2016-09-23 DIAGNOSIS — Z950 Presence of cardiac pacemaker: Secondary | ICD-10-CM | POA: Diagnosis not present

## 2016-09-23 DIAGNOSIS — R0602 Shortness of breath: Secondary | ICD-10-CM | POA: Diagnosis not present

## 2016-09-23 DIAGNOSIS — Z7901 Long term (current) use of anticoagulants: Secondary | ICD-10-CM | POA: Diagnosis not present

## 2016-09-23 DIAGNOSIS — I10 Essential (primary) hypertension: Secondary | ICD-10-CM | POA: Diagnosis not present

## 2016-09-23 DIAGNOSIS — I495 Sick sinus syndrome: Secondary | ICD-10-CM

## 2016-09-23 DIAGNOSIS — Z79899 Other long term (current) drug therapy: Secondary | ICD-10-CM | POA: Diagnosis not present

## 2016-09-23 MED ORDER — SPIRONOLACTONE 25 MG PO TABS: 12.5000 mg | ORAL_TABLET | Freq: Every day | ORAL | 3 refills | Status: DC

## 2016-09-23 NOTE — Patient Instructions (Signed)
Your physician has recommended you make the following change in your medication:   1.) STOP  The potassium.  2.) start new prescription for spironolactone. Take as directed on the bottle.  Your physician recommends that you return for lab work in: 2-3 weeks here in our office.  Your physician recommends that you schedule a follow-up appointment in: 4 months with Dr Claiborne Billings.

## 2016-09-23 NOTE — Progress Notes (Signed)
Cardiology Office Note    Date:  09/25/2016   ID:  Perez, Christine [birth year], MRN [id number]  PCP:  Marletta Lor, MD  Cardiologist:  Shelva Majestic, MD   No chief complaint on file.   History of Present Illness:  Christine Perez is a 83 y.o. female who is a former Cardiology patient of Dr. Elonda Husky who is no longer in practice in [facility].  She established cardiology care with me on 06/05/2016.I had last seen her in May 2018.  She presents for two-month follow-up evaluation.  Christine Perez has a history of sick sinus syndrome as well as paroxysmal atrial fibrillation and atrial flutter and underwent insertion of a Medtronic advisa MRI safe pacemaker on 12/05/2013 by Dr. Elonda Husky.  Since he left [facility], she has been seen by Dr. Atilano Median and last saw him in December 2017.  Her primary physician is Dr. Burnice Logan in [place].  She was admitted to the hospital with CHF exacerbation on December 17 and discharged on 03/04/2006.  She had fallen previous month and had not been very active.  She was having dietary indiscretion and was frequently having canned soup with high sodium content.  She was admitted with acute on chronic diastolic CHF.  Troponin was minimally positive at 0.07 and was felt to be consistent with demand ischemia.  During that evaluation.  An echo Doppler studies revealed an EF of 60-65%.  She had normal wall motion.  There was severe left atrial dilatation, severe right atrial dilatation, and mild RV dilatation.  PA pressure was mildly increased at 34 mm.  In that hospital record it states that the patient had chronic atrial fibrillation, but in the record of Dr. Atilano Median.  It states the patient had paroxysmal atrial fibrillation for which she was treated with amiodarone.  Her medications now include amiodarone, but she has only been taking 100 mg daily, furosemide 20 mg on Monday, Wednesday and Friday, which was started after her hospitalization, losartan 100 mg  daily, Toprol-XL 50 mg, Xarelto 15 mg, Synthroid 75 g for hypothyroidism, and supplemental potassium.   When I saw her for initial evaluation on 06/05/2016, her ECG suggested atrial flutter.  Her pacemaker was interrogated, which confirmed atrial flutter.  An attempt at antitachycardia pacing by Dr. Sallyanne Kuster was unsuccessful, raising the possibility of atypical flutter.  When I saw her, I recommended that she increase amiodarone and since that time, she has been on 200 mg twice a day.  He has noticed some mild shortness of breath and some trace ankle edema.  She had been taking furosemide every third day at 20 mgs. .  She is on Xarelto 15 mg for anticoagulation.  She is also on losartan 100 mg and Toprol-XL 50 mg for her hypertension.    She saw Dr. Sallyanne Kuster for pacemaker interrogation.  Overall burden of atrial tachycardia/atrial flutter was 73% since her last device check.  Another attempt at overdrive pacing was unsuccessful.  At time, he recommended that she decrease amiodarone to 200 mg.   When I last saw her.  Her ECG showed an AV paced rhythm at 74 bpm, and she did not appear to be in atrial flutter that had been previously noted.  Her blood pressure was elevated and I further titrated Toprol-XL to 50 mg twice a day instead of 75 mg daily.  She has been on amiodarone at 300 mg daily.  She continues to be on losartan 100 mg for hypertension.  She  is anticoagulated on Xarelto.  She has hypothyroidism on Synthroid.  She proceeded admits to shortness of breath typically while sitting.  At times she has noticed some loss of balance and for this reason, she uses a walker.  She presents for evaluation  Past Medical History:  Diagnosis Date  . Atrial fibrillation ([place])   . CARDIAC ARRHYTHMIA 01/08/2010  . COLONIC POLYPS, HX OF 10/09/2006  . COPD (chronic obstructive pulmonary disease) ([place])   . HYPERLIPIDEMIA 10/09/2006  . HYPERTENSION 10/09/2006  . HYPOTHYROIDISM 10/09/2006  . LASSITUDE 08/15/2009  .  OSTEOPOROSIS 10/09/2006  . VERTIGO 08/07/2009  . Wears glasses   . Wears hearing aid    left    Past Surgical History:  Procedure Laterality Date  . ABDOMINAL HYSTERECTOMY    . APPENDECTOMY    . BREAST SURGERY     left - lumpectomy  . CARDIOVERSION N/A 08/09/2013   Procedure: CARDIOVERSION;  Surgeon: Candee Furbish, MD;  Location: [facility] ENDOSCOPY;  Service: Cardiovascular;  Laterality: N/A;  . CATARACT EXTRACTION    . CHOLECYSTECTOMY    . PACEMAKER PLACEMENT N/A 12/05/2013  . PARTIAL MASTECTOMY WITH NEEDLE LOCALIZATION Left 06/29/2012   Procedure: PARTIAL MASTECTOMY WITH NEEDLE LOCALIZATION;  Surgeon: Adin Hector, MD;  Location: [place];  Service: General;  Laterality: Left;  . TONSILLECTOMY      Current Medications: Outpatient Medications Prior to Visit  Medication Sig Dispense Refill  . acetaminophen (TYLENOL) 500 MG tablet Take 500 mg by mouth every 6 (six) hours as needed for moderate pain or headache.    Marland Kitchen amiodarone (PACERONE) 200 MG tablet Take 1.5 tablets (300 mg total) by mouth daily. 45 tablet 6  . calcium-vitamin D (OSCAL WITH D) 500-200 MG-UNIT per tablet Take 1 tablet by mouth 2 (two) times daily.     . furosemide (LASIX) 20 MG tablet Take 1 tablet (20 mg total) by mouth daily. 90 tablet 3  . ipratropium (ATROVENT HFA) 17 MCG/ACT inhaler Inhale 2 puffs into the lungs every 4 (four) hours as needed for wheezing. 1 Inhaler 12  . losartan (COZAAR) 100 MG tablet TAKE ONE TABLET BY MOUTH ONCE DAILY 90 tablet 1  . metoprolol succinate (TOPROL-XL) 50 MG 24 hr tablet Take 1 tablet (50 mg total) by mouth 2 (two) times daily. 60 tablet 6  . Multiple Vitamin (MULTIVITAMIN) tablet Take 1 tablet by mouth daily.    Marland Kitchen SYNTHROID 75 MCG tablet Take 1 tablet (75 mcg total) by mouth daily. 90 tablet 1  . vitamin C (ASCORBIC ACID) 500 MG tablet Take 500 mg by mouth daily.    Alveda Reasons 15 MG TABS tablet TAKE 1 TABLET BY MOUTH ONCE DAILY WITH SUPPER 30 tablet 1  . zolpidem  (AMBIEN) 5 MG tablet TAKE ONE TABLET BY MOUTH AT BEDTIME 30 tablet 5  . potassium chloride SA (K-DUR,KLOR-CON) 20 MEQ tablet Take 20 mEq by mouth every Monday, Wednesday, and Friday. Takes 20 meq bid on Mon, Wed and Friday     No facility-administered medications prior to visit.      Allergies:   Codeine   Social History   Social History  . Marital status: Widowed    Spouse name: N/A  . Number of children: N/A  . Years of education: N/A   Social History Main Topics  . Smoking status: Never Smoker  . Smokeless tobacco: Never Used  . Alcohol use No  . Drug use: No  . Sexual activity: Not Asked   Other Topics  Concern  . None   Social History Narrative  . None    She is widowed.  She currently is living with her daughter in [place]..  There is no tobacco history.  She does not drink alcohol. She previously was a self-employed bookkeeper.  She had a BFA at [employer].  Family  history is notable in that parents are deceased.  Mother had colon cancer and died at age 1.  Her father had a heart attack and died at age 1., sister and maternal cousin had breast cancer, maternal aunt with ovarian cancer.  She has one son and a daughter.  ROS General: Negative; No fevers, chills, or night sweats;  HEENT: Negative; No changes in vision or hearing, sinus congestion, difficulty swallowing Pulmonary: Negative; No cough, wheezing, shortness of breath, hemoptysis Cardiovascular: See history of present illness, positive for sick sinus syndrome status post pacemaker. GI: Positive for colonic polyps GU: Negative; No dysuria, hematuria, or difficulty voiding Musculoskeletal: Negative; no myalgias, joint pain, or weakness Hematologic/Oncology: Negative; no easy bruising, bleeding Endocrine: Positive for hypothyroidism Neuro: Difficulty with balance.  No headaches Skin: Negative; No rashes or skin lesions Psychiatric: Negative; No behavioral problems, depression Sleep: Negative; No snoring,  daytime sleepiness, hypersomnolence, bruxism, restless legs, hypnogognic hallucinations, no cataplexy Other comprehensive 14 point system review is negative.   PHYSICAL EXAM:   BP (!) 158/90   Pulse 84   Ht 4' 10"  (1.473 m)   Wt 128 lb (58.1 kg)   BMI 26.75 kg/m    Repeat blood pressure by me 146/88  Wt Readings from Last 3 Encounters:  09/23/16 128 lb (58.1 kg)  08/12/16 126 lb 12.8 oz (57.5 kg)  08/04/16 127 lb 12.8 oz (58 kg)    General: Alert, oriented, no distress.  Skin: normal turgor, no rashes, warm and dry HEENT: Normocephalic, atraumatic. Pupils equal round and reactive to light; sclera anicteric; extraocular muscles intact;  Nose without nasal septal hypertrophy Mouth/Parynx benign; Mallinpatti scale Neck: No JVD, no carotid bruits; normal carotid upstroke Lungs: clear to ausculatation and percussion; no wheezing or rales Chest wall: without tenderness to palpitation Heart: PMI not displaced, RRR, s1 s2 normal, 1/6 systolic murmur, no diastolic murmur, no rubs, gallops, thrills, or heaves Abdomen: soft, nontender; no hepatosplenomehaly, BS+; abdominal aorta nontender and not dilated by palpation. Back: no CVA tenderness Pulses 2+ Musculoskeletal: full range of motion, normal strength, no joint deformities Extremities: Trace left ankle edema no clubbing cyanosis, Homan's sign negative  Neurologic: grossly nonfocal; Cranial nerves grossly wnl Psychologic: Normal mood and affect    Studies/Labs Reviewed:   ECG (independently read by me): Probable low atrial rhythm with probable inverted P waves in lead 3, heart to discern.  QTc interval 432 ms.  PR interval 128 ms.  08/06/2016 ECG (independently read by me): AV paced rhythm at 74 bpm  06/28/2026 ECG (independently read by me): Atrial flutter at 87 bpm.  QTC 454 ms.  06/05/2016  ECG (independently read by me): Probable atrial flutter versus atrial tachycardia with ventricular rate at 78 bpm.  In order to further  evaluate the probable atrial flutter, the pacemaker was interrogated by Dr. Recardo Evangelist.  This confirmed slow atrial flutter/atrial tachycardia at a cycle length of 600 ms.  Several unsuccessful attempts at antitachycardia pacing with Versed 400> 300> 200 ms were performed without success.  Recent Labs: BMP Latest Ref Rng & Units 08/01/2016 06/26/2016 04/17/2016  Glucose 65 - 99 mg/dL 104(H) 85 99  BUN 6 - 20 mg/dL 19  13 16  Creatinine 0.44 - 1.00 mg/dL 1.00 1.06(H) 1.01  Sodium 135 - 145 mmol/L 142 143 142  Potassium 3.5 - 5.1 mmol/L 4.4 4.9 4.4  Chloride 101 - 111 mmol/L 105 105 106  CO2 20 - 31 mmol/L - 30 30  Calcium 8.6 - 10.4 mg/dL - 10.6(H) 9.9     Hepatic Function Latest Ref Rng & Units 06/26/2016 03/04/2016 09/04/2015  Total Protein 6.1 - 8.1 g/dL 7.1 5.4(L) 7.3  Albumin 3.6 - 5.1 g/dL 4.2 2.7(L) 4.4  AST 10 - 35 U/L 30 31 33  ALT 6 - 29 U/L 27 21 37(H)  Alk Phosphatase 33 - 130 U/L 112 95 110  Total Bilirubin 0.2 - 1.2 mg/dL 0.6 0.7 0.4  Bilirubin, Direct 0.0 - 0.3 mg/dL - - -    CBC Latest Ref Rng & Units 08/01/2016 08/01/2016 06/26/2016  WBC 4.0 - 10.5 K/uL - 4.6 3.4(L)  Hemoglobin 12.0 - 15.0 g/dL 11.6(L) 11.6(L) 13.0  Hematocrit 36.0 - 46.0 % 34.0(L) 37.7 41.0  Platelets 150 - 400 K/uL - 111(L) 121(L)   Lab Results  Component Value Date   MCV 99.5 08/01/2016   MCV 95.8 06/26/2016   MCV 97.0 03/03/2016   Lab Results  Component Value Date   TSH 3.81 06/26/2016   No results found for: HGBA1C   BNP    Component Value Date/Time   BNP 508.3 (H) 08/01/2016 1958    ProBNP No results found for: PROBNP   Lipid Panel     Component Value Date/Time   CHOL 191 06/26/2016 0951   TRIG 90 06/26/2016 0951   HDL 71 06/26/2016 0951   CHOLHDL 2.7 06/26/2016 0951   VLDL 18 06/26/2016 0951   LDLCALC 102 (H) 06/26/2016 0951   LDLDIRECT 133.2 12/21/2012 1207     RADIOLOGY: No results found.   Additional studies/ records that were reviewed today include:  I had  previously reviewed the patient's numerous records from [place] [facility], [facility], recent hospitalization, echo Doppler study, and records from Dr. Burnice Logan.  I have reviewed the subsequent records of Dr. Sallyanne Kuster and reviewed her emergency room evaluation  03/03/16 ECHO Study Conclusions  - Left ventricle: The cavity size was normal. Systolic function was   normal. The estimated ejection fraction was in the range of 60%   to 65%. Wall motion was normal; there were no regional wall   motion abnormalities. Left ventricular diastolic function   parameters were normal. - Mitral valve: There was mild regurgitation. - Left atrium: The atrium was severely dilated. - Right ventricle: The cavity size was mildly dilated. Wall   thickness was normal. - Right atrium: The atrium was severely dilated. - Tricuspid valve: There was moderate regurgitation. - Pulmonary arteries: PA peak pressure: 34 mm Hg (S).  ASSESSMENT:    1. Essential hypertension   2. Medication management   3. SSS (sick sinus syndrome) ([place])   4. Chronic anticoagulation   5. Pacemaker   6. Shortness of breath on exertion      PLAN:  Ms. Ballantine is an 83 year old female who has a history of paroxysmal atrial fibrillation/atrial flutter and is status post insertion of a Medtronic Advisa MRI safe pacemaker on 12/05/2013 which was inserted by Dr. Elonda Husky in [facility]  She has been maintained on chronic anticoagulation and currently is receiving Xarelto 15 mg.  She has had some issues with falls and as a result, there has been some discussion concerning the possibility of discontinuing anticoagulation  due to fall risk.  When I initially saw her, she was in probable atrial flutter.  She failed an attempt at rapid atrial pacing.  Ultimately, I had titrated her amiodarone up to 400 mg daily.  When last seen, she was AV paced rhythm suggesting resolution of her prior flutter.  Her ECG today shows probable low atrial  rhythm but P waves were difficult to discern.  She was not paced with a heart rate in the 80s.  She has noticed shortness of breath which is most prominent when she attempts to lift things.  Her blood pressure is mildly increased.  I have recommended the addition of low-dose spironolactone at 12.5 mg daily and have recommended she discontinue her supplemental potassium.  It should be helpful both for shortness of breath as well as improved blood pressure control.  She will continue her current losartan 100 mg, Toprol-XL 50 mg twice a day, in addition to her furosemide 20 g daily.  She is now on amiodarone at 300 mg.  She is anticoagulated with reduced dose Xarelto 15 mg twice a day and was without bleeding.  She has hypothyroidism on Synthroid.  I will see her in 4 months for reevaluation.   Medication Adjustments/Labs and Tests Ordered: Current medicines are reviewed at length with the patient today.  Concerns regarding medicines are outlined above.  Medication changes, Labs and Tests ordered today are listed in the Patient Instructions below. Patient Instructions  Your physician has recommended you make the following change in your medication:   1.) STOP  The potassium.  2.) start new prescription for spironolactone. Take as directed on the bottle.  Your physician recommends that you return for lab work in: 2-3 weeks here in our office.  Your physician recommends that you schedule a follow-up appointment in: 4 months with Dr Claiborne Billings.       Signed, Shelva Majestic, MD , [facility] 09/25/2016 10:24 PM    [facility] [address], [place], [place], [place]  [address] Phone: [phone]

## 2016-10-07 DIAGNOSIS — H35352 Cystoid macular degeneration, left eye: Secondary | ICD-10-CM | POA: Diagnosis not present

## 2016-10-07 DIAGNOSIS — H3412 Central retinal artery occlusion, left eye: Secondary | ICD-10-CM | POA: Diagnosis not present

## 2016-10-07 DIAGNOSIS — H4720 Unspecified optic atrophy: Secondary | ICD-10-CM | POA: Diagnosis not present

## 2016-10-07 DIAGNOSIS — H34812 Central retinal vein occlusion, left eye, with macular edema: Secondary | ICD-10-CM | POA: Diagnosis not present

## 2016-10-08 ENCOUNTER — Telehealth: Payer: Self-pay | Admitting: Cardiology

## 2016-10-08 ENCOUNTER — Ambulatory Visit (INDEPENDENT_AMBULATORY_CARE_PROVIDER_SITE_OTHER): Payer: Medicare Other | Admitting: *Deleted

## 2016-10-08 DIAGNOSIS — I495 Sick sinus syndrome: Secondary | ICD-10-CM

## 2016-10-08 NOTE — Telephone Encounter (Signed)
Spoke with pt and reminded pt of remote transmission that is due today. Pt verbalized understanding.   

## 2016-10-09 ENCOUNTER — Encounter: Payer: Self-pay | Admitting: Cardiology

## 2016-10-09 NOTE — Progress Notes (Signed)
Remote pacemaker transmission.   

## 2016-10-21 DIAGNOSIS — H34812 Central retinal vein occlusion, left eye, with macular edema: Secondary | ICD-10-CM | POA: Diagnosis not present

## 2016-10-21 DIAGNOSIS — I10 Essential (primary) hypertension: Secondary | ICD-10-CM | POA: Diagnosis not present

## 2016-10-21 DIAGNOSIS — Z79899 Other long term (current) drug therapy: Secondary | ICD-10-CM | POA: Diagnosis not present

## 2016-10-22 LAB — BASIC METABOLIC PANEL
BUN / CREAT RATIO: 22 (ref 12–28)
BUN: 26 mg/dL (ref 8–27)
CALCIUM: 10.4 mg/dL — AB (ref 8.7–10.3)
CHLORIDE: 103 mmol/L (ref 96–106)
CO2: 25 mmol/L (ref 20–29)
Creatinine, Ser: 1.16 mg/dL — ABNORMAL HIGH (ref 0.57–1.00)
GFR, EST AFRICAN AMERICAN: 48 mL/min/{1.73_m2} — AB (ref >59)
GFR, EST NON AFRICAN AMERICAN: 42 mL/min/{1.73_m2} — AB (ref >59)
Glucose: 75 mg/dL (ref 65–99)
POTASSIUM: 4.9 mmol/L (ref 3.5–5.2)
Sodium: 142 mmol/L (ref 134–144)

## 2016-10-23 ENCOUNTER — Encounter: Payer: Self-pay | Admitting: Cardiology

## 2016-11-02 ENCOUNTER — Other Ambulatory Visit: Payer: Self-pay | Admitting: Internal Medicine

## 2016-11-04 ENCOUNTER — Encounter: Payer: Self-pay | Admitting: Internal Medicine

## 2016-11-04 ENCOUNTER — Ambulatory Visit (INDEPENDENT_AMBULATORY_CARE_PROVIDER_SITE_OTHER): Payer: Medicare Other | Admitting: Internal Medicine

## 2016-11-04 VITALS — BP 132/80 | HR 95 | Temp 98.0°F | Ht <= 58 in | Wt 128.6 lb

## 2016-11-04 DIAGNOSIS — Z7901 Long term (current) use of anticoagulants: Secondary | ICD-10-CM | POA: Diagnosis not present

## 2016-11-04 DIAGNOSIS — I10 Essential (primary) hypertension: Secondary | ICD-10-CM | POA: Diagnosis not present

## 2016-11-04 DIAGNOSIS — E039 Hypothyroidism, unspecified: Secondary | ICD-10-CM

## 2016-11-04 DIAGNOSIS — I480 Paroxysmal atrial fibrillation: Secondary | ICD-10-CM

## 2016-11-04 LAB — CUP PACEART REMOTE DEVICE CHECK
Brady Statistic AP VP Percent: 19.42 %
Brady Statistic AS VP Percent: 14.82 %
Brady Statistic AS VS Percent: 45.43 %
Brady Statistic RA Percent Paced: 32.95 %
Implantable Lead Location: 753859
Implantable Lead Location: 753860
Implantable Lead Model: 5076
Lead Channel Impedance Value: 323 Ohm
Lead Channel Impedance Value: 437 Ohm
Lead Channel Impedance Value: 456 Ohm
Lead Channel Pacing Threshold Amplitude: 0.75 V
Lead Channel Pacing Threshold Amplitude: 0.75 V
Lead Channel Pacing Threshold Pulse Width: 0.4 ms
Lead Channel Pacing Threshold Pulse Width: 0.4 ms
Lead Channel Sensing Intrinsic Amplitude: 0.25 mV
Lead Channel Sensing Intrinsic Amplitude: 19 mV
Lead Channel Setting Sensing Sensitivity: 0.9 mV
MDC IDC MSMT BATTERY REMAINING LONGEVITY: 61 mo
MDC IDC MSMT BATTERY VOLTAGE: 3 V
MDC IDC MSMT LEADCHNL RA SENSING INTR AMPL: 0.25 mV
MDC IDC MSMT LEADCHNL RV IMPEDANCE VALUE: 380 Ohm
MDC IDC MSMT LEADCHNL RV SENSING INTR AMPL: 19 mV
MDC IDC PG IMPLANT DT: 20150921
MDC IDC SESS DTM: 20180725200628
MDC IDC SET LEADCHNL RA PACING AMPLITUDE: 5 V
MDC IDC SET LEADCHNL RV PACING AMPLITUDE: 2 V
MDC IDC SET LEADCHNL RV PACING PULSEWIDTH: 0.4 ms
MDC IDC STAT BRADY AP VS PERCENT: 20.33 %
MDC IDC STAT BRADY RV PERCENT PACED: 32.85 %

## 2016-11-04 NOTE — Patient Instructions (Signed)
Limit your sodium (Salt) intake  Return in 4 months for follow-up  Cardiology follow-up  Ophthalmology follow-up

## 2016-11-04 NOTE — Progress Notes (Signed)
Subjective:    Patient ID: Christine Perez, female    DOB: 04/23/1925, 83 y.o.   MRN: [id number]  HPI 83 year old patient who is seen today for follow-up.  She is followed by cardiology with atrial fibrillation.  She is on amiodarone as well as anticoagulation.  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  The patient is understandably very concerned about further visual loss Otherwise, doing well.  accompanied by her daughter today.  Past Medical History:  Diagnosis Date  . Atrial fibrillation ([place])   . CARDIAC ARRHYTHMIA 01/08/2010  . COLONIC POLYPS, HX OF 10/09/2006  . COPD (chronic obstructive pulmonary disease) ([place])   . HYPERLIPIDEMIA 10/09/2006  . HYPERTENSION 10/09/2006  . HYPOTHYROIDISM 10/09/2006  . LASSITUDE 08/15/2009  . OSTEOPOROSIS 10/09/2006  . VERTIGO 08/07/2009  . Wears glasses   . Wears hearing aid    left     Social History   Social History  . Marital status: Widowed    Spouse name: N/A  . Number of children: N/A  . Years of education: N/A   Occupational History  . Not on file.   Social History Main Topics  . Smoking status: Never Smoker  . Smokeless tobacco: Never Used  . Alcohol use No  . Drug use: No  . Sexual activity: Not on file   Other Topics Concern  . Not on file   Social History Narrative  . No narrative on file    Past Surgical History:  Procedure Laterality Date  . ABDOMINAL HYSTERECTOMY    . APPENDECTOMY    . BREAST SURGERY     left - lumpectomy  . CARDIOVERSION N/A 08/09/2013   Procedure: CARDIOVERSION;  Surgeon: Candee Furbish, MD;  Location: [facility] ENDOSCOPY;  Service: Cardiovascular;  Laterality: N/A;  . CATARACT EXTRACTION    . CHOLECYSTECTOMY    . PACEMAKER PLACEMENT N/A 12/05/2013  . PARTIAL MASTECTOMY WITH NEEDLE LOCALIZATION Left 06/29/2012   Procedure: PARTIAL MASTECTOMY WITH NEEDLE LOCALIZATION;  Surgeon: Adin Hector, MD;  Location: [place];  Service: General;  Laterality: Left;  . TONSILLECTOMY      No family history on file.  Allergies  Allergen Reactions  . Codeine Shortness Of Breath    Difficulty breathing    Current Outpatient Prescriptions on File Prior to Visit  Medication Sig Dispense Refill  . acetaminophen (TYLENOL) 500 MG tablet Take 500 mg by mouth every 6 (six) hours as needed for moderate pain or headache.    Marland Kitchen amiodarone (PACERONE) 200 MG tablet Take 1.5 tablets (300 mg total) by mouth daily. 45 tablet 6  . calcium-vitamin D (OSCAL WITH D) 500-200 MG-UNIT per tablet Take 1 tablet by mouth 2 (two) times daily.     . furosemide (LASIX) 20 MG tablet Take 1 tablet (20 mg total) by mouth daily. 90 tablet 3  . ipratropium (ATROVENT HFA) 17 MCG/ACT inhaler Inhale 2 puffs into the lungs every 4 (four) hours as needed for wheezing. 1 Inhaler 12  . losartan (COZAAR) 100 MG tablet TAKE ONE TABLET BY MOUTH ONCE DAILY 90 tablet 1  . metoprolol succinate (TOPROL-XL) 50 MG 24 hr tablet Take 1 tablet (50 mg total) by mouth 2 (two) times daily. 60 tablet 6  . Multiple Vitamin (MULTIVITAMIN) tablet Take 1 tablet by mouth daily.    Marland Kitchen spironolactone (ALDACTONE) 25 MG tablet Take 0.5 tablets (12.5 mg total) by mouth daily.  30 tablet 3  . SYNTHROID 75 MCG tablet Take 1 tablet (75 mcg total) by mouth daily. 90 tablet 1  . vitamin C (ASCORBIC ACID) 500 MG tablet Take 500 mg by mouth daily.    Alveda Reasons 15 MG TABS tablet TAKE 1 TABLET BY MOUTH ONCE DAILY WITH SUPPER 30 tablet 1  . zolpidem (AMBIEN) 5 MG tablet TAKE ONE TABLET BY MOUTH AT BEDTIME 30 tablet 5  . [DISCONTINUED] metoprolol tartrate (LOPRESSOR) 25 MG tablet Take 1 tablet (25 mg total) by mouth 2 (two) times daily. 60 tablet 5   No current facility-administered medications on file prior to visit.     BP 132/80 (BP Location: Left Arm, Patient Position: Sitting, Cuff Size: Normal)   Pulse 95   Temp 98 F (36.7 C) (Oral)   Ht 4\' 10"  (1.473 m)   Wt  128 lb 9.6 oz (58.3 kg)   SpO2 96%   BMI 26.88 kg/m      Review of Systems  Constitutional: Negative.   HENT: Negative for congestion, dental problem, hearing loss, rhinorrhea, sinus pressure, sore throat and tinnitus.   Eyes: Positive for visual disturbance. Negative for pain and discharge.  Respiratory: Negative for cough and shortness of breath.   Cardiovascular: Negative for chest pain, palpitations and leg swelling.  Gastrointestinal: Negative for abdominal distention, abdominal pain, blood in stool, constipation, diarrhea, nausea and vomiting.  Genitourinary: Negative for difficulty urinating, dysuria, flank pain, frequency, hematuria, pelvic pain, urgency, vaginal bleeding, vaginal discharge and vaginal pain.  Musculoskeletal: Negative for arthralgias, gait problem and joint swelling.  Skin: Negative for rash.  Neurological: Negative for dizziness, syncope, speech difficulty, weakness, numbness and headaches.  Hematological: Negative for adenopathy.  Psychiatric/Behavioral: Negative for agitation, behavioral problems and dysphoric mood. The patient is not nervous/anxious.        Objective:   Physical Exam  Constitutional: She is oriented to person, place, and time. She appears well-developed and well-nourished.  HENT:  Head: Normocephalic.  Right Ear: External ear normal.  Left Ear: External ear normal.  Mouth/Throat: Oropharynx is clear and moist.  Hearing aids in place bilaterally  Eyes: Pupils are equal, round, and reactive to light. Conjunctivae and EOM are normal.  Neck: Normal range of motion. Neck supple. No thyromegaly present.  Cardiovascular: Normal rate, regular rhythm, normal heart sounds and intact distal pulses.   Pulmonary/Chest: Effort normal and breath sounds normal.  Slight decreased breath sounds right lower lung  Abdominal: Soft. Bowel sounds are normal. She exhibits no mass. There is no tenderness.  Musculoskeletal: Normal range of motion.    Lymphadenopathy:    She has no cervical adenopathy.  Neurological: She is alert and oriented to person, place, and time.  Skin: Skin is warm and dry. No rash noted.  Psychiatric: She has a normal mood and affect. Her behavior is normal.          Assessment & Plan:   Hypertension, well-controlled Paroxysmal atrial fibrillation.  Follow-up cardiology.  Continue anticoagulation Hypothyroidism  Follow-up 4 months  Tarryn Bogdan Pilar Plate

## 2016-11-06 ENCOUNTER — Telehealth: Payer: Self-pay | Admitting: *Deleted

## 2016-11-06 NOTE — Telephone Encounter (Signed)
Informed patient that Dr.Croitoru wants her to f/u with the DC to make an adjustment to her ppm. Patient verbalized understanding and plans to call back to schedule.

## 2016-11-06 NOTE — Telephone Encounter (Signed)
-----   Message from Sanda Klein, MD sent at 11/05/2016  7:06 PM EDT ----- Remote reviewed.   Not pacemaker dependent. Battery status is good. Lead measurements are stable. Heart rate histogram is favorable. Frequent atrial flutter is seen and there is evidence of atrial undersensing on the presentation electrogram. It is likely that we are underestimating the true prevalence of atrial flutter.  Looks like we have some atrial undersensing going on. Please bring him in to [facility] to readjust his atrial sensitivity if possible. It's at 0.25, but I think we can drop to 0.15 mV.

## 2016-11-10 NOTE — Telephone Encounter (Signed)
-----   Message from Sanda Klein, MD sent at 11/05/2016  7:06 PM EDT ----- Remote reviewed.   Not pacemaker dependent. Battery status is good. Lead measurements are stable. Heart rate histogram is favorable. Frequent atrial flutter is seen and there is evidence of atrial undersensing on the presentation electrogram. It is likely that we are underestimating the true prevalence of atrial flutter.  Looks like we have some atrial undersensing going on. Please bring him in to [facility] to readjust his atrial sensitivity if possible. It's at 0.25, but I think we can drop to 0.15 mV.

## 2016-11-10 NOTE — Telephone Encounter (Signed)
Spoke with pt's daughter, pt is agreeable to appointment on 9/12 to have atrial sensitivity adjusted per Dr. Loletha Grayer.

## 2016-11-18 DIAGNOSIS — H3562 Retinal hemorrhage, left eye: Secondary | ICD-10-CM | POA: Diagnosis not present

## 2016-11-18 DIAGNOSIS — H35352 Cystoid macular degeneration, left eye: Secondary | ICD-10-CM | POA: Diagnosis not present

## 2016-11-18 DIAGNOSIS — H348122 Central retinal vein occlusion, left eye, stable: Secondary | ICD-10-CM | POA: Diagnosis not present

## 2016-11-18 DIAGNOSIS — H3412 Central retinal artery occlusion, left eye: Secondary | ICD-10-CM | POA: Diagnosis not present

## 2016-11-24 ENCOUNTER — Other Ambulatory Visit: Payer: Self-pay | Admitting: Internal Medicine

## 2016-11-27 ENCOUNTER — Encounter: Payer: Self-pay | Admitting: Cardiovascular Disease

## 2016-12-04 ENCOUNTER — Encounter: Payer: Self-pay | Admitting: Internal Medicine

## 2016-12-16 DIAGNOSIS — H3562 Retinal hemorrhage, left eye: Secondary | ICD-10-CM | POA: Diagnosis not present

## 2016-12-16 DIAGNOSIS — H3412 Central retinal artery occlusion, left eye: Secondary | ICD-10-CM | POA: Diagnosis not present

## 2016-12-16 DIAGNOSIS — H348122 Central retinal vein occlusion, left eye, stable: Secondary | ICD-10-CM | POA: Diagnosis not present

## 2016-12-16 DIAGNOSIS — H35352 Cystoid macular degeneration, left eye: Secondary | ICD-10-CM | POA: Diagnosis not present

## 2017-01-03 ENCOUNTER — Other Ambulatory Visit: Payer: Self-pay | Admitting: Internal Medicine

## 2017-01-07 ENCOUNTER — Encounter: Payer: Medicare Other | Admitting: *Deleted

## 2017-01-07 ENCOUNTER — Telehealth: Payer: Self-pay | Admitting: Cardiology

## 2017-01-07 NOTE — Telephone Encounter (Signed)
LMOVM reminding pt to send remote transmission.   

## 2017-01-08 ENCOUNTER — Encounter: Payer: Self-pay | Admitting: Cardiology

## 2017-01-13 DIAGNOSIS — H34822 Venous engorgement, left eye: Secondary | ICD-10-CM | POA: Diagnosis not present

## 2017-01-13 DIAGNOSIS — H348122 Central retinal vein occlusion, left eye, stable: Secondary | ICD-10-CM | POA: Diagnosis not present

## 2017-01-13 DIAGNOSIS — H35352 Cystoid macular degeneration, left eye: Secondary | ICD-10-CM | POA: Diagnosis not present

## 2017-01-13 DIAGNOSIS — H3562 Retinal hemorrhage, left eye: Secondary | ICD-10-CM | POA: Diagnosis not present

## 2017-01-22 ENCOUNTER — Ambulatory Visit (INDEPENDENT_AMBULATORY_CARE_PROVIDER_SITE_OTHER): Payer: Medicare Other | Admitting: *Deleted

## 2017-01-22 DIAGNOSIS — I495 Sick sinus syndrome: Secondary | ICD-10-CM

## 2017-01-23 ENCOUNTER — Encounter: Payer: Self-pay | Admitting: Cardiology

## 2017-01-23 NOTE — Progress Notes (Signed)
Remote pacemaker transmission.   

## 2017-01-28 LAB — CUP PACEART REMOTE DEVICE CHECK
Battery Remaining Longevity: 31 mo
Brady Statistic AP VP Percent: 5.98 %
Brady Statistic AS VP Percent: 10.54 %
Brady Statistic RA Percent Paced: 40.78 %
Brady Statistic RV Percent Paced: 16.27 %
Date Time Interrogation Session: 20181108160841
Implantable Lead Location: 753860
Implantable Lead Model: 5076
Implantable Lead Model: 5076
Lead Channel Impedance Value: 323 Ohm
Lead Channel Impedance Value: 399 Ohm
Lead Channel Pacing Threshold Amplitude: 0.75 V
Lead Channel Pacing Threshold Pulse Width: 0.4 ms
Lead Channel Pacing Threshold Pulse Width: 0.4 ms
Lead Channel Sensing Intrinsic Amplitude: 1.375 mV
Lead Channel Sensing Intrinsic Amplitude: 1.375 mV
Lead Channel Setting Pacing Amplitude: 5 V
MDC IDC LEAD LOCATION: 753859
MDC IDC MSMT BATTERY VOLTAGE: 2.97 V
MDC IDC MSMT LEADCHNL RA IMPEDANCE VALUE: 399 Ohm
MDC IDC MSMT LEADCHNL RV IMPEDANCE VALUE: 475 Ohm
MDC IDC MSMT LEADCHNL RV PACING THRESHOLD AMPLITUDE: 0.75 V
MDC IDC MSMT LEADCHNL RV SENSING INTR AMPL: 17.5 mV
MDC IDC MSMT LEADCHNL RV SENSING INTR AMPL: 17.5 mV
MDC IDC PG IMPLANT DT: 20150921
MDC IDC SET LEADCHNL RV PACING AMPLITUDE: 2 V
MDC IDC SET LEADCHNL RV PACING PULSEWIDTH: 0.4 ms
MDC IDC SET LEADCHNL RV SENSING SENSITIVITY: 0.9 mV
MDC IDC STAT BRADY AP VS PERCENT: 36.75 %
MDC IDC STAT BRADY AS VS PERCENT: 46.73 %

## 2017-02-02 NOTE — Progress Notes (Signed)
[facility] Note  02/03/2017     CHIEF COMPLAINT Patient presents for Retina Evaluation   HISTORY OF PRESENT ILLNESS: Christine Perez is a 83 y.o. female who presents to the clinic today for:   HPI    Retina Evaluation    In both eyes.  This started 1 week ago.  Associated Symptoms Blind Spot and Floaters.  Negative for Distortion, Photophobia, Glare, Redness, Scalp Tenderness, Jaw Claudication, Shoulder/Hip pain, Fatigue, Weight Loss, Fever, Trauma, Pain and Flashes.  Context:  distance vision, mid-range vision and near vision.  Treatments tried include eye drops, laser and surgery.  Response to treatment was mild improvement.  I, the attending physician,  performed the HPI with the patient and updated documentation appropriately.          Comments    Referral of DR. Rankin for evaluation of retina. Patient states about a week ago she had black dots in right eye which lasted a few minutes and since then vision has been cloudy off /on. She reports she has a Hx of  blind spots OS. Dr. Katy Fitch removed cataracts 3 years ago OU. Laser surgery left eye 10/2015  . Uses Systane gtts PRN. Takes vits Qd       Last edited by Bernarda Caffey, MD on 02/03/2017  2:29 PM. (History)      Referring physician: Hurman Horn, MD [place], [place] [address]  HISTORICAL INFORMATION:   Selected notes from the MEDICAL RECORD NUMBER Dr. Zadie Rhine pt -- functionally monocular pt secondary to CRVO OS, concern for spot in vision OD  LEE- 10.30.18 (G. Rankin) [BCVA OD: 20/25 OS: CF @ 1'] Ocular Hx- pseudophakia OU, CRVO w/ neovascularization and CME OS s/p IVA #6 (last inj 07.24.18), S/P PRP OS (08.07.18), retinal hemorrhage OS, K scar OD, optic atrophy OS, DES OU PMH- hypothyroidism, HTN, vertigo, A-Fib, hx of breast ca, COPD, CHF, chronic anticoagulation    CURRENT MEDICATIONS: No current outpatient medications on file. (Ophthalmic Drugs)   No current  facility-administered medications for this visit.  (Ophthalmic Drugs)   Current Outpatient Medications (Other)  Medication Sig  . acetaminophen (TYLENOL) 500 MG tablet Take 500 mg by mouth every 6 (six) hours as needed for moderate pain or headache.  Marland Kitchen amiodarone (PACERONE) 200 MG tablet Take 1.5 tablets (300 mg total) by mouth daily.  . calcium-vitamin D (OSCAL WITH D) 500-200 MG-UNIT per tablet Take 1 tablet by mouth 2 (two) times daily.   . furosemide (LASIX) 20 MG tablet Take 1 tablet (20 mg total) by mouth daily.  Marland Kitchen ipratropium (ATROVENT HFA) 17 MCG/ACT inhaler Inhale 2 puffs into the lungs every 4 (four) hours as needed for wheezing.  Marland Kitchen losartan (COZAAR) 100 MG tablet TAKE ONE TABLET BY MOUTH ONCE DAILY  . metoprolol succinate (TOPROL-XL) 50 MG 24 hr tablet Take 1 tablet (50 mg total) by mouth 2 (two) times daily.  . Multiple Vitamin (MULTIVITAMIN) tablet Take 1 tablet by mouth daily.  . Potassium Chloride ER 20 MEQ TBCR TAKE 1 TABLET BY MOUTH ON MONDAY, WEDNESDAY, AND FRIDAY  . spironolactone (ALDACTONE) 25 MG tablet   . SYNTHROID 75 MCG tablet Take 1 tablet (75 mcg total) by mouth daily.  . vitamin C (ASCORBIC ACID) 500 MG tablet Take 500 mg by mouth daily.  Alveda Reasons 15 MG TABS tablet TAKE 1 TABLET BY MOUTH ONCE DAILY FOR SUPPER  . zolpidem (AMBIEN) 5 MG tablet TAKE ONE TABLET BY MOUTH AT  BEDTIME  . spironolactone (ALDACTONE) 25 MG tablet Take 0.5 tablets (12.5 mg total) by mouth daily.   Current Facility-Administered Medications (Other)  Medication Route  . Bevacizumab (AVASTIN) SOLN 1.25 mg Intravitreal      REVIEW OF SYSTEMS: ROS    Positive for: Genitourinary, Musculoskeletal, Cardiovascular, Eyes, Respiratory, Heme/Lymph   Negative for: Constitutional, Gastrointestinal, Neurological, HENT, Endocrine, Psychiatric, Allergic/Imm   Last edited by Zenovia Jordan, LPN on 24/26/8341  9:62 PM. (History)       ALLERGIES Allergies  Allergen Reactions  . Codeine Shortness  Of Breath    Difficulty breathing    PAST MEDICAL HISTORY Past Medical History:  Diagnosis Date  . Atrial fibrillation ([place])   . CARDIAC ARRHYTHMIA 01/08/2010  . COLONIC POLYPS, HX OF 10/09/2006  . COPD (chronic obstructive pulmonary disease) ([address])   . HYPERLIPIDEMIA 10/09/2006  . HYPERTENSION 10/09/2006  . HYPOTHYROIDISM 10/09/2006  . LASSITUDE 08/15/2009  . OSTEOPOROSIS 10/09/2006  . VERTIGO 08/07/2009  . Wears glasses   . Wears hearing aid    left   Past Surgical History:  Procedure Laterality Date  . ABDOMINAL HYSTERECTOMY    . APPENDECTOMY    . BREAST SURGERY     left - lumpectomy  . CARDIOVERSION N/A 08/09/2013   Procedure: CARDIOVERSION;  Surgeon: Candee Furbish, MD;  Location: [facility] ENDOSCOPY;  Service: Cardiovascular;  Laterality: N/A;  . CATARACT EXTRACTION    . CHOLECYSTECTOMY    . PACEMAKER PLACEMENT N/A 12/05/2013  . PARTIAL MASTECTOMY WITH NEEDLE LOCALIZATION Left 06/29/2012   Procedure: PARTIAL MASTECTOMY WITH NEEDLE LOCALIZATION;  Surgeon: Adin Hector, MD;  Location: [facility];  Service: General;  Laterality: Left;  . TONSILLECTOMY    . YAG LASER APPLICATION      FAMILY HISTORY Family History  Problem Relation Age of Onset  . Cancer Mother   . Heart failure Father   . Cancer Sister   . Stroke Sister     SOCIAL HISTORY Social History   Tobacco Use  . Smoking status: Never Smoker  . Smokeless tobacco: Never Used  Substance Use Topics  . Alcohol use: No  . Drug use: No         OPHTHALMIC EXAM:  Base Eye Exam    Visual Acuity (Snellen - Linear)      Right Left   Dist cc 20/25 -1 CF at face   Dist ph cc NI NI   Correction:  Glasses       Tonometry (Tonopen, 2:12 PM)      Right Left   Pressure 18 15       Pupils      Dark Light Shape React APD   Right 4 3 Round 2 None   Left 5 5 Round Minimal +3       Visual Fields (Counting fingers)      Left Right   Restrictions Total inferior temporal, superior nasal, inferior nasal  deficiencies; Partial outer superior temporal deficiency Total inferior temporal, inferior nasal deficiencies       Extraocular Movement      Right Left    Full, Ortho Full, Ortho       Neuro/Psych    Oriented x3:  Yes   Mood/Affect:  Normal       Dilation    Both eyes:  1.0% Mydriacyl, 2.5% Phenylephrine @ 2:12 PM        Slit Lamp and Fundus Exam    External Exam      Right Left  External Normal Normal       Slit Lamp Exam      Right Left   Lids/Lashes UL dermato UL dermato   Conjunctiva/Sclera White and quiet White and quiet   Cornea 2+ PEE; arcus; tear film debris 2-3+ PEE; arcus; tear film debris   Anterior Chamber Deep and quiet Deep and quiet   Iris Round, dilated Round, dilated, No NVI   Lens PCIOL; 2+ PCO PCIOL   Vitreous syneresis syneresis       Fundus Exam      Right Left   Disc Normal mild pallor, peripapillary pigmentation temporally, blot and flame hemorrhage at 1200   C/D Ratio 0.3 0.3   Macula flat, Retinal pigment epithelial mottling, Drusen blot and flame hemorrhages, +CME   Vessels Normal Vascular attenuation, Tortuous   Periphery Attached, mild reticular degeneration nasally attached with 360 PRP - some room for fill in, intra retinal hemorrhage        Refraction    Wearing Rx      Sphere Cylinder Axis Add   Right -1.25 +2.50 008 +3.00   Left -1.00 +2.25 172 +3.00   Age:  4   Type:  Bifocal       Manifest Refraction (Over)      Sphere Cylinder Axis Dist VA   Right -1.00 +2.00 010 20/25   Left -2.50 +2.75 170 CF          IMAGING AND PROCEDURES  Imaging and Procedures for 02/04/17  OCT, Retina - OU - Both Eyes     Right Eye Quality was good. Central Foveal Thickness: 270. Progression has no prior data. Findings include normal foveal contour, no IRF, no SRF, retinal drusen .   Left Eye Quality was good. Central Foveal Thickness: 511. Progression has no prior data. Findings include cystoid macular edema, no SRF, intraretinal  fluid, retinal drusen .   Notes Images taken, stored on drive  Diagnosis / Impression:  OD: NFP; no IRF/SRF; rare drusen OS: CRVO w/ CME; drusen  Clinical management:  See below  Abbreviations: NFP - Normal foveal profile. CME - cystoid macular edema. PED - pigment epithelial detachment. IRF - intraretinal fluid. SRF - subretinal fluid. EZ - ellipsoid zone. ERM - epiretinal membrane. ORA - outer retinal atrophy. ORT - outer retinal tubulation. SRHM - subretinal hyper-reflective material         Intravitreal Injection, Pharmacologic Agent - OS - Left Eye     Time Out 02/03/2017. 3:36 PM. Confirmed correct patient, procedure, site, and patient consented.   Anesthesia Topical anesthesia was used. Anesthetic medications included Lidocaine 2%, Tetracaine 0.5%.   Procedure Preparation included 5% betadine to ocular surface, eyelid speculum. A supplied needle was used.   Injection: 1.25 mg Bevacizumab 1.25mg /0.24ml   NDC: 24401-027-25    Lot: [id number]     Expiration Date: 03/25/2017   Route: Intravitreal   Site: Left Eye   Waste: 0 mg  Post-op Post injection exam found visual acuity of at least counting fingers. The patient tolerated the procedure well. There were no complications. The patient received written and verbal post procedure care education.                 ASSESSMENT/PLAN:    ICD-10-CM   1. Central retinal vein occlusion of left eye, unspecified complication status 14/11/2017 OCT, Retina - OU - Both Eyes    Intravitreal Injection, Pharmacologic Agent - OS - Left Eye    Bevacizumab (AVASTIN) SOLN 1.25 mg  2. Retinal  edema H35.81 OCT, Retina - OU - Both Eyes    Intravitreal Injection, Pharmacologic Agent - OS - Left Eye    Bevacizumab (AVASTIN) SOLN 1.25 mg  3. Hypertensive retinopathy of both eyes H35.033   4. Pseudophakia of both eyes Z96.1   5. PCO (posterior capsular opacification), right H26.491   6. Dry eye syndrome of both eyes H04.123     1, 2.  CRVO w/ CME, OS  - The natural history of retinal vein occlusion and macular edema and treatment options including observation, laser photocoagulation, and intravitreal antiVEGF injection with Avastin and Lucentis and Eylea and intravitreal injection of steroids with triamcinolone and Ozurdex and the complications of these procedures including loss of vision, infection, cataract, glaucoma, and retinal detachment were discussed with patient.  - Specifically discussed findings from [place] / [place] study regarding patient stabilization with anti-VEGF agents and increased potential for visual improvements.  Also discussed need for frequent follow up and potentially multiple injections given the chronic nature of the disease process  - s/p IVA #6 (last inj 07.24.18), s/p PRP OS (08.07.18)  - OCT today with significant CME  - exam with persistent hemorrhage  - recommend IVA #7 today  - pt wishes to proceed with injection  - RBA of procedure discussed, questions answered  - informed consent obtained and signed  - see procedure note  - f/u in 4 wks -- repeat DFE/OCT, possible IVA OS, consider PRP fill in OS  3. Hypertensive retinopathy OU - discussed importance of tight BP control - monitor  4. Pseudophakia OU - s/p CE/IOL OU by Dr. Katy Fitch - beautiful surgery, doing well - monitor  5. PCO OD - may benefit from YAG cap - monitor for now  6. Dry eye syndrome OU - likely visually significant and related to chief complaint of transient intermittent spots in vision - recommend artificial tears and/or lubricating ointment 4-6x/day, as needed   Ophthalmic Meds Ordered this visit:  Meds ordered this encounter  Medications  . Bevacizumab (AVASTIN) SOLN 1.25 mg       Return in about 4 weeks (around 03/03/2017) for F/U CRVO w/ CME OS.  There are no Patient Instructions on file for this visit.   Explained the diagnoses, plan, and follow up with the patient and they expressed understanding.   Patient expressed understanding of the importance of proper follow up care.   Gardiner Sleeper, M.D., Ph.D. Diseases & Surgery of the Retina and Vitreous [facility] 02/04/17     Abbreviations: M myopia (nearsighted); A astigmatism; H hyperopia (farsighted); P presbyopia; Mrx spectacle prescription;  CTL contact lenses; OD right eye; OS left eye; OU both eyes  XT exotropia; ET esotropia; PEK punctate epithelial keratitis; PEE punctate epithelial erosions; DES dry eye syndrome; MGD meibomian gland dysfunction; ATs artificial tears; PFAT's preservative free artificial tears; [place] nuclear sclerotic cataract; PSC posterior subcapsular cataract; ERM epi-retinal membrane; PVD posterior vitreous detachment; RD retinal detachment; DM diabetes mellitus; DR diabetic retinopathy; NPDR non-proliferative diabetic retinopathy; PDR proliferative diabetic retinopathy; CSME clinically significant macular edema; DME diabetic macular edema; dbh dot blot hemorrhages; CWS cotton wool spot; POAG primary open angle glaucoma; C/D cup-to-disc ratio; HVF humphrey visual field; GVF goldmann visual field; OCT optical coherence tomography; IOP intraocular pressure; BRVO Branch retinal vein occlusion; CRVO central retinal vein occlusion; CRAO central retinal artery occlusion; BRAO branch retinal artery occlusion; RT retinal tear; SB scleral buckle; PPV pars plana vitrectomy; VH Vitreous hemorrhage; PRP panretinal laser photocoagulation; IVK  intravitreal kenalog; VMT vitreomacular traction; MH Macular hole;  NVD neovascularization of the disc; NVE neovascularization elsewhere; AREDS age related eye disease study; ARMD age related macular degeneration; POAG primary open angle glaucoma; EBMD epithelial/anterior basement membrane dystrophy; ACIOL anterior chamber intraocular lens; IOL intraocular lens; PCIOL posterior chamber intraocular lens; Phaco/IOL phacoemulsification with intraocular lens placement; [place]  photorefractive keratectomy; LASIK laser assisted in situ keratomileusis; HTN hypertension; DM diabetes mellitus; COPD chronic obstructive pulmonary disease

## 2017-02-03 ENCOUNTER — Encounter (INDEPENDENT_AMBULATORY_CARE_PROVIDER_SITE_OTHER): Payer: Self-pay | Admitting: Ophthalmology

## 2017-02-03 ENCOUNTER — Ambulatory Visit (INDEPENDENT_AMBULATORY_CARE_PROVIDER_SITE_OTHER): Payer: Medicare Other | Admitting: Ophthalmology

## 2017-02-03 DIAGNOSIS — H26491 Other secondary cataract, right eye: Secondary | ICD-10-CM | POA: Diagnosis not present

## 2017-02-03 DIAGNOSIS — H34812 Central retinal vein occlusion, left eye, with macular edema: Secondary | ICD-10-CM

## 2017-02-03 DIAGNOSIS — H35033 Hypertensive retinopathy, bilateral: Secondary | ICD-10-CM

## 2017-02-03 DIAGNOSIS — H04123 Dry eye syndrome of bilateral lacrimal glands: Secondary | ICD-10-CM | POA: Diagnosis not present

## 2017-02-03 DIAGNOSIS — Z961 Presence of intraocular lens: Secondary | ICD-10-CM

## 2017-02-03 DIAGNOSIS — H3581 Retinal edema: Secondary | ICD-10-CM | POA: Diagnosis not present

## 2017-02-04 ENCOUNTER — Encounter (INDEPENDENT_AMBULATORY_CARE_PROVIDER_SITE_OTHER): Payer: Self-pay | Admitting: Ophthalmology

## 2017-02-04 DIAGNOSIS — H34812 Central retinal vein occlusion, left eye, with macular edema: Secondary | ICD-10-CM | POA: Diagnosis not present

## 2017-02-04 DIAGNOSIS — H3581 Retinal edema: Secondary | ICD-10-CM | POA: Diagnosis not present

## 2017-02-04 DIAGNOSIS — Z961 Presence of intraocular lens: Secondary | ICD-10-CM | POA: Diagnosis not present

## 2017-02-04 DIAGNOSIS — H26491 Other secondary cataract, right eye: Secondary | ICD-10-CM | POA: Diagnosis not present

## 2017-02-04 DIAGNOSIS — H04123 Dry eye syndrome of bilateral lacrimal glands: Secondary | ICD-10-CM | POA: Diagnosis not present

## 2017-02-04 DIAGNOSIS — H35033 Hypertensive retinopathy, bilateral: Secondary | ICD-10-CM | POA: Diagnosis not present

## 2017-02-04 MED ORDER — BEVACIZUMAB CHEMO INJECTION 1.25MG/0.05ML SYRINGE FOR KALEIDOSCOPE
1.2500 mg | INTRAVITREAL | Status: DC
  Administered 2017-02-04: 1.25 mg via INTRAVITREAL

## 2017-02-10 ENCOUNTER — Ambulatory Visit (INDEPENDENT_AMBULATORY_CARE_PROVIDER_SITE_OTHER): Payer: Medicare Other | Admitting: Cardiovascular Disease

## 2017-02-10 ENCOUNTER — Encounter: Payer: Self-pay | Admitting: Cardiovascular Disease

## 2017-02-10 VITALS — BP 130/80 | HR 73 | Ht 59.0 in | Wt 131.6 lb

## 2017-02-10 DIAGNOSIS — I10 Essential (primary) hypertension: Secondary | ICD-10-CM

## 2017-02-10 DIAGNOSIS — I480 Paroxysmal atrial fibrillation: Secondary | ICD-10-CM

## 2017-02-10 DIAGNOSIS — M25472 Effusion, left ankle: Secondary | ICD-10-CM

## 2017-02-10 DIAGNOSIS — Z950 Presence of cardiac pacemaker: Secondary | ICD-10-CM

## 2017-02-10 DIAGNOSIS — Z7901 Long term (current) use of anticoagulants: Secondary | ICD-10-CM

## 2017-02-10 DIAGNOSIS — I495 Sick sinus syndrome: Secondary | ICD-10-CM | POA: Diagnosis not present

## 2017-02-10 NOTE — Progress Notes (Signed)
Cardiology Office Note    Date:  02/10/2017   ID:  Christine, Perez April 04, 1925, MRN [id number]  PCP:  Marletta Lor, MD  Cardiologist:  Shelva Majestic, MD   Follow-up office visit  History of Present Illness:  Christine Perez is a 83 y.o. female who is a former Cardiology patient of Dr. Elonda Perez who is no longer in practice in [facility].  She established cardiology care with me on 06/05/2016.I had last seen her in May 2018.  She presents for a 67-monthfollow-up evaluation.  Ms. LNovakovichhas a history of sick sinus syndrome as well as paroxysmal atrial fibrillation and atrial flutter and underwent insertion of a Medtronic advisa MRI safe pacemaker on 12/05/2013 by Christine Perez  [facility] by Dr. CAtilano Medianand last saw him in December 2017.  Her primary physician is Dr. KBurnice Perez GMarbury  She was admitted to the hospital with CHF exacerbation on December 17 and discharged on 03/04/2006.  She had fallen previous month and had not been very active.  She was having dietary indiscretion and was frequently having canned soup with high sodium content.  She was admitted with acute on chronic diastolic CHF.  Troponin was minimally positive at 0.07 and was felt to be consistent with demand ischemia.  During that evaluation.  An echo Doppler studies revealed an EF of 60-65%.  She had normal wall motion.  There was severe left atrial dilatation, severe right atrial dilatation, and mild RV dilatation.  PA pressure was mildly increased at 34 mm.  In that hospital record it states that the patient had chronic atrial fibrillation, but in the record of Dr. cAtilano Perez  It states the patient had paroxysmal atrial fibrillation for which she was treated with amiodarone.  Her medications now include amiodarone, but she has only been taking 100 mg daily, furosemide 20 mg on Monday, Wednesday and Friday, which was started after her hospitalization, losartan 100 mg daily,  Toprol-XL 50 mg, Xarelto 15 mg, Synthroid 75 g for hypothyroidism, and supplemental potassium.   When I saw her for initial evaluation on 06/05/2016, her ECG suggested atrial flutter.  Her pacemaker was interrogated, which confirmed atrial flutter.  An attempt at antitachycardia pacing by Dr. CSallyanne Kusterwas unsuccessful, raising the possibility of atypical flutter.  When I saw her, I recommended that she increase amiodarone and since that time, she has been on 200 mg twice a day.  He has noticed some mild shortness of breath and some trace ankle edema.  She had been taking furosemide every third day at 20 mgs. .  She is on Xarelto 15 mg for anticoagulation.  She is also on losartan 100 mg and Toprol-XL 50 mg for her hypertension.    She saw Dr. CSallyanne Kusterfor pacemaker interrogation.  Overall burden of atrial tachycardia/atrial flutter was 73% since her last device check.  Another attempt at overdrive pacing was unsuccessful.  At time, he recommended that she decrease amiodarone to 200 mg.   When I saw her in May 2018 her ECG showed an AV paced rhythm at 74 bpm, and she did not appear to be in atrial flutter that had been previously noted.  Her blood pressure was elevated and I further titrated Toprol-XL to 50 mg twice a day instead of 75 mg daily.  She has been on amiodarone at 300 mg daily. She was on losartan 100 mg for hypertension.  She is anticoagulated on Xarelto.  She has hypothyroidism on Synthroid.   Since I last saw her in July 2018, she has continued to do well.  She recently underwent a pacemaker remote device check on 01/28/2017, which revealed favorable heart rate histogram.  She was not pacemaker dependent.  Her battery status was good.  There was no clinically significant episodes of high ventricular rate were atrial mode switch noted.  Her atrial fibrillation burden was 2%, which was much lower than earlier in the year.  She walks with a walker at home and typically a cane outside the house.   She denies PND, orthopnea, or significant swelling.  She presents for reevaluation.  Past Medical History:  Diagnosis Date  . Atrial fibrillation (Christine Perez)   . CARDIAC ARRHYTHMIA 01/08/2010  . COLONIC POLYPS, HX OF 10/09/2006  . COPD (chronic obstructive pulmonary disease) ([place])   . HYPERLIPIDEMIA 10/09/2006  . HYPERTENSION 10/09/2006  . HYPOTHYROIDISM 10/09/2006  . LASSITUDE 08/15/2009  . OSTEOPOROSIS 10/09/2006  . VERTIGO 08/07/2009  . Wears glasses   . Wears hearing aid    left    Past Surgical History:  Procedure Laterality Date  . ABDOMINAL HYSTERECTOMY    . APPENDECTOMY    . BREAST SURGERY     left - lumpectomy  . CARDIOVERSION N/A 08/09/2013   Procedure: CARDIOVERSION;  Surgeon: Christine Furbish, MD;  Location: [facility] ENDOSCOPY;  Service: Cardiovascular;  Laterality: N/A;  . CATARACT EXTRACTION    . CHOLECYSTECTOMY    . PACEMAKER PLACEMENT N/A 12/05/2013  . PARTIAL MASTECTOMY WITH NEEDLE LOCALIZATION Left 06/29/2012   Procedure: PARTIAL MASTECTOMY WITH NEEDLE LOCALIZATION;  Surgeon: Adin Hector, MD;  Location: [place];  Service: General;  Laterality: Left;  . TONSILLECTOMY    . YAG LASER APPLICATION      Current Medications: Outpatient Medications Prior to Visit  Medication Sig Dispense Refill  . acetaminophen (TYLENOL) 500 MG tablet Take 500 mg by mouth every 6 (six) hours as needed for moderate pain or headache.    Marland Kitchen amiodarone (PACERONE) 200 MG tablet Take 1.5 tablets (300 mg total) by mouth daily. 45 tablet 6  . calcium-vitamin D (OSCAL WITH D) 500-200 MG-UNIT per tablet Take 1 tablet by mouth 2 (two) times daily.     . furosemide (LASIX) 20 MG tablet Take 1 tablet (20 mg total) by mouth daily. 90 tablet 3  . ipratropium (ATROVENT HFA) 17 MCG/ACT inhaler Inhale 2 puffs into the lungs every 4 (four) hours as needed for wheezing. 1 Inhaler 12  . losartan (COZAAR) 100 MG tablet TAKE ONE TABLET BY MOUTH ONCE DAILY 90 tablet 1  . metoprolol succinate (TOPROL-XL) 50  MG 24 hr tablet Take 1 tablet (50 mg total) by mouth 2 (two) times daily. 60 tablet 6  . Multiple Vitamin (MULTIVITAMIN) tablet Take 1 tablet by mouth daily.    . Potassium Chloride ER 20 MEQ TBCR TAKE 1 TABLET BY MOUTH ON MONDAY, WEDNESDAY, AND FRIDAY 60 tablet 1  . spironolactone (ALDACTONE) 25 MG tablet   3  . SYNTHROID 75 MCG tablet Take 1 tablet (75 mcg total) by mouth daily. 90 tablet 1  . vitamin C (ASCORBIC ACID) 500 MG tablet Take 500 mg by mouth daily.    Alveda Reasons 15 MG TABS tablet TAKE 1 TABLET BY MOUTH ONCE DAILY FOR SUPPER 30 tablet 1  . zolpidem (AMBIEN) 5 MG tablet TAKE ONE TABLET BY MOUTH AT BEDTIME 30 tablet 5  . spironolactone (ALDACTONE) 25 MG tablet Take 0.5 tablets (12.5 mg  total) by mouth daily. 30 tablet 3  . Bevacizumab (AVASTIN) SOLN 1.25 mg      No facility-administered medications prior to visit.      Allergies:   Codeine   Social History   Socioeconomic History  . Marital status: Widowed    Spouse name: None  . Number of children: None  . Years of education: None  . Highest education level: None  Social Needs  . Financial resource strain: None  . Food insecurity - worry: None  . Food insecurity - inability: None  . Transportation needs - medical: None  . Transportation needs - non-medical: None  Occupational History  . None  Tobacco Use  . Smoking status: Never Smoker  . Smokeless tobacco: Never Used  Substance and Sexual Activity  . Alcohol use: No  . Drug use: No  . Sexual activity: None  Other Topics Concern  . None  Social History Narrative  . None    She is widowed.  She currently is living with her daughter in [place]..  There is no tobacco history.  She does not drink alcohol. She previously was a self-employed bookkeeper.  She had a BFA at [employer].  Family  history is notable in that parents are deceased.  Mother had colon cancer and died at age 58.  Her father had a heart attack and died at age 10., sister and maternal cousin had  breast cancer, maternal aunt with ovarian cancer.  She has one son and a daughter.  ROS General: Negative; No fevers, chills, or night sweats;  HEENT: Negative; No changes in vision or hearing, sinus congestion, difficulty swallowing Pulmonary: Negative; No cough, wheezing, shortness of breath, hemoptysis Cardiovascular: See history of present illness, positive for sick sinus syndrome status post pacemaker. GI: Positive for colonic polyps GU: Negative; No dysuria, hematuria, or difficulty voiding Musculoskeletal: Negative; no myalgias, joint pain, or weakness Hematologic/Oncology: Negative; no easy bruising, bleeding Endocrine: Positive for hypothyroidism Neuro: Difficulty with balance.  No headaches Skin: Negative; No rashes or skin lesions Psychiatric: Negative; No behavioral problems, depression Sleep: Negative; No snoring, daytime sleepiness, hypersomnolence, bruxism, restless legs, hypnogognic hallucinations, no cataplexy Other comprehensive 14 point system review is negative.   PHYSICAL EXAM:   BP 130/80   Pulse 73   Ht _0  (1.499 m)   Wt 131 lb 9.6 oz (59.7 kg)   BMI 26.58 kg/m    Repeat blood pressure by me was 122/80  Wt Readings from Last 3 Encounters:  02/10/17 131 lb 9.6 oz (59.7 kg)  11/04/16 128 lb 9.6 oz (58.3 kg)  09/23/16 128 lb (58.1 kg)    General: Alert, oriented, no distress.  Skin: normal turgor, no rashes, warm and dry HEENT: Normocephalic, atraumatic. Pupils equal round and reactive to light; sclera anicteric; extraocular muscles intact;  Nose without nasal septal hypertrophy Mouth/Parynx benign; Mallinpatti scale 2 Neck: No JVD, no carotid bruits; normal carotid upstroke Lungs: clear to ausculatation and percussion; no wheezing or rales Chest wall: without tenderness to palpitation Heart: PMI not displaced, RRR, s1 s2 normal, 1/6 systolic murmur, no diastolic murmur, no rubs, gallops, thrills, or heaves Abdomen: soft, nontender; no  hepatosplenomehaly, BS+; abdominal aorta nontender and not dilated by palpation. Back: no CVA tenderness Pulses 2+ Musculoskeletal: full range of motion, normal strength, no joint deformities Extremities: Trivial if any left ankle edema no clubbing, cyanosis, Homan's sign negative  Neurologic: grossly nonfocal; Cranial nerves grossly wnl Psychologic: Normal mood and affect   Studies/Labs Reviewed:  ECG (independently read by me): Atrially paced rhythm at 73 bpm.  Prolonged AV conduction with a PR interval 320 ms.  QTc interval 460 ms.  09/23/2016 ECG (independently read by me): Probable low atrial rhythm with probable inverted P waves in lead 3, heart to discern.  QTc interval 432 ms.  PR interval 128 ms.  08/06/2016 ECG (independently read by me): AV paced rhythm at 74 bpm  06/28/2026 ECG (independently read by me): Atrial flutter at 87 bpm.  QTC 454 ms.  06/05/2016  ECG (independently read by me): Probable atrial flutter versus atrial tachycardia with ventricular rate at 78 bpm.  In order to further evaluate the probable atrial flutter, the pacemaker was interrogated by Dr. Recardo Evangelist.  This confirmed slow atrial flutter/atrial tachycardia at a cycle length of 600 ms.  Several unsuccessful attempts at antitachycardia pacing with Versed 400> 300> 200 ms were performed without success.  Recent Labs: BMP Latest Ref Rng & Units 10/21/2016 08/01/2016 06/26/2016  Glucose 65 - 99 mg/dL 75 104(H) 85  BUN 8 - 27 mg/dL _0 Creatinine 0.57 - 1.00 mg/dL 1.16(H) 1.00 1.06(H)  BUN/Creat Ratio 12 - 28 22 - -  Sodium 134 - 144 mmol/L 142 142 143  Potassium 3.5 - 5.2 mmol/L 4.9 4.4 4.9  Chloride 96 - 106 mmol/L 103 105 105  CO2 20 - 29 mmol/L 25 - 30  Calcium 8.7 - 10.3 mg/dL 10.4(H) - 10.6(H)     Hepatic Function Latest Ref Rng & Units 06/26/2016 03/04/2016 09/04/2015  Total Protein 6.1 - 8.1 g/dL 7.1 5.4(L) 7.3  Albumin 3.6 - 5.1 g/dL 4.2 2.7(L) 4.4  AST 10 - 35 U/L 30 31 33  ALT 6 - 29  U/L 27 21 37(H)  Alk Phosphatase 33 - 130 U/L 112 95 110  Total Bilirubin 0.2 - 1.2 mg/dL 0.6 0.7 0.4  Bilirubin, Direct 0.0 - 0.3 mg/dL - - -    CBC Latest Ref Rng & Units 08/01/2016 08/01/2016 06/26/2016  WBC 4.0 - 10.5 K/uL - 4.6 3.4(L)  Hemoglobin 12.0 - 15.0 g/dL 11.6(L) 11.6(L) 13.0  Hematocrit 36.0 - 46.0 % 34.0(L) 37.7 41.0  Platelets 150 - 400 K/uL - 111(L) 121(L)   Lab Results  Component Value Date   MCV 99.5 08/01/2016   MCV 95.8 06/26/2016   MCV 97.0 03/03/2016   Lab Results  Component Value Date   TSH 3.81 06/26/2016   No results found for: HGBA1C   BNP    Component Value Date/Time   BNP 508.3 (H) 08/01/2016 1958    ProBNP No results found for: PROBNP   Lipid Panel     Component Value Date/Time   CHOL 191 06/26/2016 0951   TRIG 90 06/26/2016 0951   HDL 71 06/26/2016 0951   CHOLHDL 2.7 06/26/2016 0951   VLDL 18 06/26/2016 0951   LDLCALC 102 (H) 06/26/2016 0951   LDLDIRECT 133.2 12/21/2012 1207     RADIOLOGY: No results found.   Additional studies/ records that were reviewed today include:  I had previously reviewed the patient's numerous records from [place] [facility], [facility], recent hospitalization, echo Doppler study, and records from Dr. Burnice Perez.  I have reviewed the subsequent records of Dr. Sallyanne Perez and reviewed her emergency room evaluation  I reviewed her pace heart remote device check from 01/28/2017  03/03/16 ECHO Study Conclusions  - Left ventricle: The cavity size was normal. Systolic function was   normal. The estimated ejection fraction was in the range of  60%   to 65%. Wall motion was normal; there were no regional wall   motion abnormalities. Left ventricular diastolic function   parameters were normal. - Mitral valve: There was mild regurgitation. - Left atrium: The atrium was severely dilated. - Right ventricle: The cavity size was mildly dilated. Wall   thickness was normal. - Right atrium: The atrium  was severely dilated. - Tricuspid valve: There was moderate regurgitation. - Pulmonary arteries: PA peak pressure: 34 mm Hg (S).  ASSESSMENT:    1. Essential hypertension   2. SSS (sick sinus syndrome) ([place])   3. Chronic anticoagulation   4. Pacemaker   5. Left ankle swelling   6. Paroxysmal atrial fibrillation [facility])      PLAN:  Ms. Dehaas is an 83 year old female who has a history of paroxysmal atrial fibrillation/atrial flutter and is status post insertion of a Medtronic Advisa MRI safe pacemaker on 12/05/2013 which was inserted by Dr. Elonda Perez in [facility].  She has been maintained on chronic anticoagulation and currently is receiving Xarelto 15 mg. When I initially saw her, she was in probable atrial flutter.  She failed an attempt at rapid atrial pacing.  Ultimately, I had titrated her amiodarone up to 400 mg daily.  When last seen, she was AV paced rhythm suggesting resolution of her prior flutter.  .  She has been maintaining an atrially paced rhythm and her most recent device check only shows less than 2% episodes of atrial fibrillation.  She feels well.  She's not having signs of CHF.  There is no PND, orthopnea.  Her blood pressure today is improved on her med regimen now consisting of losartan 100 mg daily, Toprol-XL 50 [place] grams twice a day, spironolactone 12.5 mg, and she takes furosemide 20 mg on Monday, Wednesday and Friday with potassium.  In the past.  Side recommended discontinuance of supplemental potassium while on spironolactone.  She continues to be on amiodarone 300 mg daily and I will not reduce this presently.  In the future if she remains stable, may be possible to try reducing this dose to 200 mg as long as she does not have recurrent breakthrough atrial flutter/fibrillation.  I will recommend follow-up  bmet to assess electrolytes.  I will see her in 6 months for reevaluation.  Medication Adjustments/Labs and Tests Ordered: Current medicines are reviewed at length  with the patient today.  Concerns regarding medicines are outlined above.  Medication changes, Labs and Tests ordered today are listed in the Patient Instructions below. Patient Instructions  Medication Instructions: Your physician recommends that you continue on your current medications as directed. Please refer to the Current Medication list given to you today.  If you need a refill on your cardiac medications before your next appointment, please call your pharmacy.    Follow-Up: Your physician wants you to follow-up in: 6 months with Dr. Dow Adolph will receive a reminder letter in the mail two months in advance. If you don't receive a letter, please call our office at [phone] to schedule this follow-up appointment.   Thank you for choosing Heartcare at [facility]!!        Signed, Shelva Majestic, MD , [facility] 02/10/2017 6:47 PM    [facility] [address] [place], [place], [place]  [address] Phone: [phone]

## 2017-02-10 NOTE — Patient Instructions (Signed)
Medication Instructions: Your physician recommends that you continue on your current medications as directed. Please refer to the Current Medication list given to you today.  If you need a refill on your cardiac medications before your next appointment, please call your pharmacy.    Follow-Up: Your physician wants you to follow-up in 6 months with Dr. Kelly. You will receive a reminder letter in the mail two months in advance. If you don't receive a letter, please call our office at [phone] to schedule this follow-up appointment.   Thank you for choosing [facility] at [facility]!!      

## 2017-02-11 ENCOUNTER — Telehealth: Payer: Self-pay | Admitting: *Deleted

## 2017-02-11 DIAGNOSIS — Z79899 Other long term (current) drug therapy: Secondary | ICD-10-CM

## 2017-02-11 NOTE — Telephone Encounter (Signed)
Patient had OV yesterday with Dr.Kelly.   Per Dr. Claiborne Billings, patient needs to STOP potassium and have repeat BMET.    Called and left detailed message (ok per DPR) with recommendations.  Order placed for lab work, lab hours given.  Advised to call back with questions or concerns.

## 2017-02-14 ENCOUNTER — Other Ambulatory Visit: Payer: Self-pay | Admitting: Internal Medicine

## 2017-02-18 NOTE — Telephone Encounter (Signed)
Spoke to patient, aware of medication changes and will repeat BMET.   Patient verbalized understanding. Advised to call with further questions or concerns.

## 2017-02-24 ENCOUNTER — Ambulatory Visit: Payer: Medicare Other | Admitting: Internal Medicine

## 2017-03-02 NOTE — Progress Notes (Signed)
[facility] Note  03/03/2017     CHIEF COMPLAINT Patient presents for Retina Follow Up   HISTORY OF PRESENT ILLNESS: Christine Perez is a 83 y.o. female who presents to the clinic today for:   HPI    Retina Follow Up    Patient presents with  CRVO/BRVO.  In left eye.  Severity is moderate.  Duration of 4 weeks.  Since onset it is stable.  I, the attending physician,  performed the HPI with the patient and updated documentation appropriately.          Comments    Pt presents today for F/U of CRVO with CME OS, pt states vision has been stable since last visit, states she is seeing "spots" OD that she is concerned about, denies pain or wavy vision, states she uses Systane PRN        Last edited by Bernarda Caffey, MD on 03/03/2017  2:18 PM. (History)    Pt states she tolerated last injection well; Pt states that she was unable to see much of a difference in [place] since having injection;   Referring physician: Clent Jacks, MD [address] [place], [place] [address]  HISTORICAL INFORMATION:   Selected notes from the MEDICAL RECORD NUMBER Dr. Zadie Rhine pt -- functionally monocular pt secondary to CRVO OS, concern for spot in vision OD  LEE- 10.30.18 (G. Rankin) [BCVA OD: 20/25 OS: CF @ 1'] Ocular Hx- pseudophakia OU, CRVO w/ neovascularization and CME OS s/p IVA #6 (last inj 07.24.18), S/P PRP OS (08.07.18), retinal hemorrhage OS, K scar OD, optic atrophy OS, DES OU PMH- hypothyroidism, HTN, vertigo, A-Fib, hx of breast ca, COPD, CHF, chronic anticoagulation    CURRENT MEDICATIONS: No current outpatient medications on file. (Ophthalmic Drugs)   No current facility-administered medications for this visit.  (Ophthalmic Drugs)   Current Outpatient Medications (Other)  Medication Sig  . acetaminophen (TYLENOL) 500 MG tablet Take 500 mg by mouth every 6 (six) hours as needed for moderate pain or headache.  Marland Kitchen amiodarone (PACERONE) 200 MG tablet Take  1.5 tablets (300 mg total) by mouth daily.  . calcium-vitamin D (OSCAL WITH D) 500-200 MG-UNIT per tablet Take 1 tablet by mouth 2 (two) times daily.   . furosemide (LASIX) 20 MG tablet Take 1 tablet (20 mg total) by mouth daily.  Marland Kitchen ipratropium (ATROVENT HFA) 17 MCG/ACT inhaler Inhale 2 puffs into the lungs every 4 (four) hours as needed for wheezing.  Marland Kitchen losartan (COZAAR) 100 MG tablet TAKE ONE TABLET BY MOUTH ONCE DAILY  . metoprolol succinate (TOPROL-XL) 50 MG 24 hr tablet Take 1 tablet (50 mg total) by mouth 2 (two) times daily.  . Multiple Vitamin (MULTIVITAMIN) tablet Take 1 tablet by mouth daily.  Marland Kitchen spironolactone (ALDACTONE) 25 MG tablet Take 12.5 mg by mouth daily.  Marland Kitchen SYNTHROID 75 MCG tablet TAKE 1 TABLET BY MOUTH ONCE DAILY  . vitamin C (ASCORBIC ACID) 500 MG tablet Take 500 mg by mouth daily.  Alveda Reasons 15 MG TABS tablet TAKE 1 TABLET BY MOUTH ONCE DAILY FOR SUPPER  . zolpidem (AMBIEN) 5 MG tablet TAKE ONE TABLET BY MOUTH AT BEDTIME   Current Facility-Administered Medications (Other)  Medication Route  . Bevacizumab (AVASTIN) SOLN 1.25 mg Intravitreal      REVIEW OF SYSTEMS: ROS    Positive for: Eyes   Negative for: Constitutional, Gastrointestinal, Neurological, Skin, Genitourinary, Musculoskeletal, HENT, Endocrine, Cardiovascular, Respiratory, Psychiatric, Allergic/Imm, Heme/Lymph  Last edited by Debbrah Alar, COT on 03/03/2017  1:26 PM. (History)       ALLERGIES Allergies  Allergen Reactions  . Codeine Shortness Of Breath    Difficulty breathing    PAST MEDICAL HISTORY Past Medical History:  Diagnosis Date  . Atrial fibrillation ([place])   . CARDIAC ARRHYTHMIA 01/08/2010  . COLONIC POLYPS, HX OF 10/09/2006  . COPD (chronic obstructive pulmonary disease) ([place])   . HYPERLIPIDEMIA 10/09/2006  . HYPERTENSION 10/09/2006  . HYPOTHYROIDISM 10/09/2006  . LASSITUDE 08/15/2009  . OSTEOPOROSIS 10/09/2006  . VERTIGO 08/07/2009  . Wears glasses   . Wears hearing aid     left   Past Surgical History:  Procedure Laterality Date  . ABDOMINAL HYSTERECTOMY    . APPENDECTOMY    . BREAST SURGERY     left - lumpectomy  . CARDIOVERSION N/A 08/09/2013   Procedure: CARDIOVERSION;  Surgeon: Candee Furbish, MD;  [facility] ENDOSCOPY;  Service: Cardiovascular;  Laterality: N/A;  . CATARACT EXTRACTION    . CHOLECYSTECTOMY    . PACEMAKER PLACEMENT N/A 12/05/2013  . PARTIAL MASTECTOMY WITH NEEDLE LOCALIZATION Left 06/29/2012   Procedure: PARTIAL MASTECTOMY WITH NEEDLE LOCALIZATION;  Surgeon: Adin Hector, MD;  Location: [place];  Service: General;  Laterality: Left;  . TONSILLECTOMY    . YAG LASER APPLICATION      FAMILY HISTORY Family History  Problem Relation Age of Onset  . Cancer Mother   . Heart failure Father   . Cancer Sister   . Stroke Sister     SOCIAL HISTORY Social History   Tobacco Use  . Smoking status: Never Smoker  . Smokeless tobacco: Never Used  Substance Use Topics  . Alcohol use: No  . Drug use: No         OPHTHALMIC EXAM:  Base Eye Exam    Visual Acuity (Snellen - Linear)      Right Left   Dist cc 20/30 -1 CF at face   Dist ph cc NI NI   Correction:  Glasses       Tonometry (Tonopen, 1:40 PM)      Right Left   Pressure 12 10       Pupils      Dark Light Shape React APD   Right 4 2 Round Brisk None   Left 4 4 Round Minimal +3       Visual Fields (Counting fingers)      Left Right   Restrictions Total superior temporal, inferior temporal, superior nasal, inferior nasal deficiencies Partial outer inferior temporal, inferior nasal deficiencies       Extraocular Movement      Right Left    Full, Ortho Full, Ortho       Neuro/Psych    Oriented x3:  Yes   Mood/Affect:  Normal       Dilation    Both eyes:  1.0% Mydriacyl, 2.5% Phenylephrine @ 1:40 PM        Slit Lamp and Fundus Exam    External Exam      Right Left   External Normal Normal       Slit Lamp Exam      Right Left    Lids/Lashes UL dermato UL dermato   Conjunctiva/Sclera White and quiet White and quiet   Cornea 2+ PEE; arcus; tear film debris 2-3+ PEE; arcus; tear film debris   Anterior Chamber Deep and quiet Deep and quiet   Iris Round, dilated Round, dilated,  No NVI   Lens PCIOL; 2+ PCO PCIOL   Vitreous syneresis syneresis       Fundus Exam      Right Left   Disc Normal mild pallor, peripapillary pigmentation temporally, blot and flame hemorrhages at 1200 - improved   C/D Ratio 0.3 0.3   Macula flat, Retinal pigment epithelial mottling, Drusen blot and flame hemorrhages - improved, CME - resolved   Vessels Normal Vascular attenuation, Tortuous   Periphery Attached, mild reticular degeneration nasally attached with 360 PRP - some room for fill in, intra retinal hemorrhage - clearing          IMAGING AND PROCEDURES  Imaging and Procedures for 03/04/17  OCT, Retina - OU - Both Eyes     Right Eye Quality was good. Central Foveal Thickness: 266. Progression has been stable. Findings include normal foveal contour, no IRF, no SRF, retinal drusen .   Left Eye Quality was poor. Central Foveal Thickness: 208. Progression has improved. Findings include no SRF, intraretinal fluid, retinal drusen , abnormal foveal contour (Interval improvement in IRF).   Notes Images taken, stored on drive  Diagnosis / Impression:  OD: NFP; no IRF/SRF; rare drusen OS: CRVO w/ interval improvement of CME; drusen  Clinical management:  See below  Abbreviations: NFP - Normal foveal profile. CME - cystoid macular edema. PED - pigment epithelial detachment. IRF - intraretinal fluid. SRF - subretinal fluid. EZ - ellipsoid zone. ERM - epiretinal membrane. ORA - outer retinal atrophy. ORT - outer retinal tubulation. SRHM - subretinal hyper-reflective material         Intravitreal Injection, Pharmacologic Agent - OS - Left Eye     Time Out 03/03/2017. 3:10 PM. Confirmed correct patient, procedure, site, and patient  consented.   Anesthesia Topical anesthesia was used. Anesthetic medications included Tetracaine 0.5%, Lidocaine 2%.   Procedure Preparation included 5% betadine to ocular surface, eyelid speculum. A supplied needle was used.   Injection: 1.25 mg Bevacizumab (AVASTIN) SOLN 1.25 mg   NDC: 94709-628-36    Lot: [id number]    Expiration Date: 04/19/2017   Route: Intravitreal   Site: Left Eye   Waste: 0 mg  Post-op Post injection exam found visual acuity of at least counting fingers. The patient tolerated the procedure well. There were no complications. The patient received written and verbal post procedure care education.                 ASSESSMENT/PLAN:    ICD-10-CM   1. Central retinal vein occlusion with macular edema of left eye H34.8120 OCT, Retina - OU - Both Eyes    Intravitreal Injection, Pharmacologic Agent - OS - Left Eye    Bevacizumab (AVASTIN) SOLN 1.25 mg  2. Retinal edema H35.81   3. Hypertensive retinopathy of both eyes H35.033   4. Pseudophakia of both eyes Z96.1   5. PCO (posterior capsular opacification), right H26.491   6. Dry eye syndrome of both eyes H04.123     1, 2. CRVO w/ CME, OS  - The natural history of retinal vein occlusion and macular edema and treatment options including observation, laser photocoagulation, and intravitreal antiVEGF injection with Avastin and Lucentis and Eylea and intravitreal injection of steroids with triamcinolone and Ozurdex and the complications of these procedures including loss of vision, infection, cataract, glaucoma, and retinal detachment were discussed with patient.  - Specifically discussed findings from [place] / [place] study regarding patient stabilization with anti-VEGF agents and increased potential for visual improvements.  Also discussed  need for frequent follow up and potentially multiple injections given the chronic nature of the disease process  - s/p IVA #6 (last inj 07.24.18), s/p PRP OS (08.07.18), IVA OS  #7 (11.20.18)  - OCT today with significant improvement in / resolution of CME  - exam with persistent hemorrhage  - recommend IVA #8 OS today, 12.18.18 -- recommend treat and extend to 6 wks  - pt wishes to proceed with injection  - RBA of procedure discussed, questions answered  - informed consent obtained and signed  - see procedure note  - f/u in 6 wks -- repeat DFE/OCT, possible IVA OS, consider PRP fill in OS  3. Hypertensive retinopathy OU - discussed importance of tight BP control - monitor  4. Pseudophakia OU - s/p CE/IOL OU by Dr. Elliot Dally - beautiful surgery, doing well - monitor  5. PCO OD - may benefit from YAG cap - discussed findings and prognosis - pt wishes to monitor for now, but will proceed with YAG capsulotomy OD next year  6. Dry eye syndrome OU - likely visually significant and related to chief complaint of transient intermittent spots in vision - recommend artificial tears and/or lubricating ointment 4-6x/day, as needed   Ophthalmic Meds Ordered this visit:  Meds ordered this encounter  Medications  . Bevacizumab (AVASTIN) SOLN 1.25 mg       Return in about 6 weeks (around 04/14/2017) for F/U CRVO w/ CME OS, Dilated exam.  There are no Patient Instructions on file for this visit.   Explained the diagnoses, plan, and follow up with the patient and they expressed understanding.  Patient expressed understanding of the importance of proper follow up care.   This document serves as a record of services personally performed by Gardiner Sleeper, MD, PhD. It was created on their behalf by Catha Brow, [place], a certified ophthalmic assistant. The creation of this record is the provider's dictation and/or activities during the visit.  Electronically signed by: Catha Brow, COA  03/04/17 1:37 PM    Gardiner Sleeper, M.D., Ph.D. Diseases & Surgery of the Retina and [place] 03/04/17   I have reviewed the above  documentation for accuracy and completeness, and I agree with the above. Gardiner Sleeper, M.D., Ph.D. 03/04/17 1:37 PM     Abbreviations: M myopia (nearsighted); A astigmatism; H hyperopia (farsighted); P presbyopia; Mrx spectacle prescription;  CTL contact lenses; OD right eye; OS left eye; OU both eyes  XT exotropia; ET esotropia; PEK punctate epithelial keratitis; PEE punctate epithelial erosions; DES dry eye syndrome; MGD meibomian gland dysfunction; ATs artificial tears; PFAT's preservative free artificial tears; [place] nuclear sclerotic cataract; PSC posterior subcapsular cataract; ERM epi-retinal membrane; PVD posterior vitreous detachment; RD retinal detachment; DM diabetes mellitus; DR diabetic retinopathy; NPDR non-proliferative diabetic retinopathy; PDR proliferative diabetic retinopathy; CSME clinically significant macular edema; DME diabetic macular edema; dbh dot blot hemorrhages; CWS cotton wool spot; POAG primary open angle glaucoma; C/D cup-to-disc ratio; HVF humphrey visual field; GVF goldmann visual field; OCT optical coherence tomography; IOP intraocular pressure; BRVO Branch retinal vein occlusion; CRVO central retinal vein occlusion; CRAO central retinal artery occlusion; BRAO branch retinal artery occlusion; RT retinal tear; SB scleral buckle; PPV pars plana vitrectomy; VH Vitreous hemorrhage; PRP panretinal laser photocoagulation; IVK intravitreal kenalog; VMT vitreomacular traction; MH Macular hole;  NVD neovascularization of the disc; NVE neovascularization elsewhere; AREDS age related eye disease study; ARMD age related macular degeneration; POAG primary open angle glaucoma; EBMD  epithelial/anterior basement membrane dystrophy; ACIOL anterior chamber intraocular lens; IOL intraocular lens; PCIOL posterior chamber intraocular lens; Phaco/IOL phacoemulsification with intraocular lens placement; [place] photorefractive keratectomy; LASIK laser assisted in situ keratomileusis; HTN hypertension;  DM diabetes mellitus; COPD chronic obstructive pulmonary disease

## 2017-03-03 ENCOUNTER — Ambulatory Visit (INDEPENDENT_AMBULATORY_CARE_PROVIDER_SITE_OTHER): Payer: Medicare Other | Admitting: Ophthalmology

## 2017-03-03 ENCOUNTER — Encounter (INDEPENDENT_AMBULATORY_CARE_PROVIDER_SITE_OTHER): Payer: Self-pay | Admitting: Ophthalmology

## 2017-03-03 DIAGNOSIS — H34812 Central retinal vein occlusion, left eye, with macular edema: Secondary | ICD-10-CM

## 2017-03-03 DIAGNOSIS — Z961 Presence of intraocular lens: Secondary | ICD-10-CM | POA: Diagnosis not present

## 2017-03-03 DIAGNOSIS — H3581 Retinal edema: Secondary | ICD-10-CM

## 2017-03-03 DIAGNOSIS — H04123 Dry eye syndrome of bilateral lacrimal glands: Secondary | ICD-10-CM

## 2017-03-03 DIAGNOSIS — H35033 Hypertensive retinopathy, bilateral: Secondary | ICD-10-CM | POA: Diagnosis not present

## 2017-03-03 DIAGNOSIS — H26491 Other secondary cataract, right eye: Secondary | ICD-10-CM

## 2017-03-03 MED ORDER — BEVACIZUMAB CHEMO INJECTION 1.25MG/0.05ML SYRINGE FOR KALEIDOSCOPE
1.2500 mg | INTRAVITREAL | Status: AC
  Administered 2017-03-03: 1.25 mg via INTRAVITREAL

## 2017-03-07 ENCOUNTER — Other Ambulatory Visit: Payer: Self-pay | Admitting: Internal Medicine

## 2017-03-07 ENCOUNTER — Other Ambulatory Visit: Payer: Self-pay | Admitting: Cardiovascular Disease

## 2017-03-15 ENCOUNTER — Other Ambulatory Visit: Payer: Self-pay | Admitting: Internal Medicine

## 2017-03-26 ENCOUNTER — Telehealth: Payer: Self-pay | Admitting: Internal Medicine

## 2017-03-26 MED ORDER — TRAMADOL HCL 50 MG PO TABS: 50.0000 mg | ORAL_TABLET | Freq: Two times a day (BID) | ORAL | 0 refills | Status: DC | PRN

## 2017-03-26 NOTE — Telephone Encounter (Signed)
Copied from [place] [phone]. Topic: Inquiry >> Mar 26, 2017 10:14 AM Malena Catholic I, NT wrote: Reason for CRM: Pt Daughter  call and said her mom need pain med for her Osteoporosis she don,t Remember the name of the pain Med

## 2017-03-26 NOTE — Telephone Encounter (Signed)
[name] (dpr on file) notified to pick up rx at [facility]

## 2017-03-26 NOTE — Telephone Encounter (Signed)
Reviewed chart and see that Dr. Raliegh Ip has prescribed tramadol in the past.  Last filled on 01/30/16 #60.  Will forward for authorization.

## 2017-03-26 NOTE — Telephone Encounter (Signed)
Okay to refill tramadol 50 mg #61 twice daily as needed for pain

## 2017-03-31 ENCOUNTER — Ambulatory Visit: Payer: Medicare Other | Admitting: Internal Medicine

## 2017-04-04 ENCOUNTER — Other Ambulatory Visit: Payer: Self-pay | Admitting: Cardiovascular Disease

## 2017-04-13 NOTE — Progress Notes (Signed)
[facility] Note  04/14/2017     CHIEF COMPLAINT Patient presents for Retina Follow Up   HISTORY OF PRESENT ILLNESS: Christine Perez is a 84 y.o. female who presents to the clinic today for:   HPI    Retina Follow Up    Patient presents with  CRVO/BRVO.  In left eye.  This started 1 year ago.  Severity is moderate.  Since onset it is gradually improving.  I, the attending physician,  performed the HPI with the patient and updated documentation appropriately.          Comments    F/U CRVO w/CME OS. Patient states she continues to have issues seeing out of center part of left eye. Pt reports "she can not read as good anymore out of right eye, her vision has decreased". She is ready to move forward with Avastin TX today. Pt uses systane Ou Prn, takes multivitamin Qd       Last edited by Bernarda Caffey, MD on 04/14/2017 11:13 PM. (History)    Pt states she tolerated last injection well; Pt states that she was unable to see much of a difference in OS VA since having injection;   Referring physician: Marletta Lor, MD [place], [place] [id number]  HISTORICAL INFORMATION:   Selected notes from the MEDICAL RECORD NUMBER Dr. Zadie Rhine pt -- functionally monocular pt secondary to CRVO OS, concern for spot in vision OD  LEE- 10.30.18 (G. Rankin) [BCVA OD: 20/25 OS: CF @ 1'] Ocular Hx- pseudophakia OU, CRVO w/ neovascularization and CME OS s/p IVA #6 (last inj 07.24.18), S/P PRP OS (08.07.18), retinal hemorrhage OS, K scar OD, optic atrophy OS, DES OU PMH- hypothyroidism, HTN, vertigo, A-Fib, hx of breast ca, COPD, CHF, chronic anticoagulation    CURRENT MEDICATIONS: Current Outpatient Medications (Ophthalmic Drugs)  Medication Sig  . prednisoLONE acetate (PRED FORTE) 1 % ophthalmic suspension Place 1 drop into the right eye 4 (four) times daily for 7 days.   No current facility-administered medications for this visit.  (Ophthalmic  Drugs)   Current Outpatient Medications (Other)  Medication Sig  . acetaminophen (TYLENOL) 500 MG tablet Take 500 mg by mouth every 6 (six) hours as needed for moderate pain or headache.  Marland Kitchen amiodarone (PACERONE) 200 MG tablet Take 1.5 tablets (300 mg total) by mouth daily.  Marland Kitchen amiodarone (PACERONE) 200 MG tablet TAKE 1 TABLET BY MOUTH TWICE DAILY  . calcium-vitamin D (OSCAL WITH D) 500-200 MG-UNIT per tablet Take 1 tablet by mouth 2 (two) times daily.   . furosemide (LASIX) 20 MG tablet Take 1 tablet (20 mg total) by mouth daily.  Marland Kitchen ipratropium (ATROVENT HFA) 17 MCG/ACT inhaler Inhale 2 puffs into the lungs every 4 (four) hours as needed for wheezing.  Marland Kitchen losartan (COZAAR) 100 MG tablet TAKE 1 TABLET BY MOUTH ONCE DAILY  . metoprolol succinate (TOPROL-XL) 50 MG 24 hr tablet Take 1 tablet (50 mg total) by mouth 2 (two) times daily.  . Multiple Vitamin (MULTIVITAMIN) tablet Take 1 tablet by mouth daily.  Marland Kitchen spironolactone (ALDACTONE) 25 MG tablet Take 12.5 mg by mouth daily.  Marland Kitchen spironolactone (ALDACTONE) 25 MG tablet TAKE 1/2 (ONE-HALF) TABLET BY MOUTH ONCE DAILY  . SYNTHROID 75 MCG tablet TAKE 1 TABLET BY MOUTH ONCE DAILY  . traMADol (ULTRAM) 50 MG tablet Take 1 tablet (50 mg total) by mouth 2 (two) times daily as needed (pain).  . vitamin C (ASCORBIC ACID) 500 MG  tablet Take 500 mg by mouth daily.  Alveda Reasons 15 MG TABS tablet TAKE 1 TABLET BY MOUTH ONCE DAILY WITH SUPPER  . zolpidem (AMBIEN) 5 MG tablet TAKE ONE TABLET BY MOUTH AT BEDTIME   Current Facility-Administered Medications (Other)  Medication Route  . Bevacizumab (AVASTIN) SOLN 1.25 mg Intravitreal      REVIEW OF SYSTEMS: ROS    Positive for: Musculoskeletal, Endocrine, Cardiovascular, Eyes, Respiratory   Negative for: Constitutional, Gastrointestinal, Neurological, Skin, Genitourinary, HENT, Psychiatric, Allergic/Imm, Heme/Lymph   Last edited by Zenovia Jordan, LPN on 03/22/2692  8:54 PM. (History)        ALLERGIES Allergies  Allergen Reactions  . Codeine Shortness Of Breath    Difficulty breathing    PAST MEDICAL HISTORY Past Medical History:  Diagnosis Date  . Atrial fibrillation ([place])   . CARDIAC ARRHYTHMIA 01/08/2010  . COLONIC POLYPS, HX OF 10/09/2006  . COPD (chronic obstructive pulmonary disease) ([place])   . HYPERLIPIDEMIA 10/09/2006  . HYPERTENSION 10/09/2006  . HYPOTHYROIDISM 10/09/2006  . LASSITUDE 08/15/2009  . OSTEOPOROSIS 10/09/2006  . VERTIGO 08/07/2009  . Wears glasses   . Wears hearing aid    left   Past Surgical History:  Procedure Laterality Date  . ABDOMINAL HYSTERECTOMY    . APPENDECTOMY    . BREAST SURGERY     left - lumpectomy  . CARDIOVERSION N/A 08/09/2013   Procedure: CARDIOVERSION;  Surgeon: Candee Furbish, MD;  Location[facility] END[facility];  Service: Cardiovascular;  Laterality: N/A;  . CATARACT EXTRACTION    . CHOLECYSTECTOMY    . PACEMAKER PLACEMENT N/A 12/05/2013  . PARTIAL MASTECTOMY WITH NEEDLE LOCALIZATION Left 06/29/2012   Procedure: PARTIAL MASTECTOMY WITH NEEDLE LOCALIZATION;  Surgeon: Adin Hector, MD;  Location: [facility];  Service: General;  Laterality: Left;  . TONSILLECTOMY    . YAG LASER APPLICATION      FAMILY HISTORY Family History  Problem Relation Age of Onset  . Cancer Mother   . Heart failure Father   . Cancer Sister   . Stroke Sister     SOCIAL HISTORY Social History   Tobacco Use  . Smoking status: Never Smoker  . Smokeless tobacco: Never Used  Substance Use Topics  . Alcohol use: No  . Drug use: No         OPHTHALMIC EXAM:  Base Eye Exam    Visual Acuity (Snellen - Linear)      Right Left   Dist cc 20/40 +2 HM   Dist ph cc 20/30 NI   Correction:  Glasses       Tonometry (Tonopen, 2:54 PM)      Right Left   Pressure 15 18       Pupils      Dark Light Shape React APD   Right 4 2 Round Slow None   Left 4 4 Round None +3       Visual Fields (Counting fingers)      Left Right      Full   Restrictions Total superior temporal, inferior temporal, superior nasal, inferior nasal deficiencies        Extraocular Movement      Right Left    Full, Ortho Full, Ortho       Neuro/Psych    Oriented x3:  Yes   Mood/Affect:  Normal       Dilation    Both eyes:  1.0% Mydriacyl, 2.5% Phenylephrine @ 2:54 PM        Slit Lamp  and Fundus Exam    External Exam      Right Left   External Normal Normal       Slit Lamp Exam      Right Left   Lids/Lashes UL dermato UL dermato   Conjunctiva/Sclera White and quiet White and quiet   Cornea 2+ PEE; arcus; tear film debris 2-3+ PEE; arcus; tear film debris   Anterior Chamber Deep and quiet Deep and quiet   Iris Round, dilated Round, dilated, No NVI   Lens PCIOL; 2-3+ PCO PCIOL   Vitreous syneresis syneresis       Fundus Exam      Right Left   Disc Normal mild pallor, peripapillary pigmentation temporally, blot and flame hemorrhages at 1200 - improved   C/D Ratio 0.3 0.3   Macula flat, Retinal pigment epithelial mottling, Drusen blot and flame hemorrhages - improved, CME resolved, Drusen   Vessels Normal Vascular attenuation, Tortuous   Periphery Attached, mild reticular degeneration nasally attached with 360 PRP - some room for fill in, intra retinal hemorrhage - clearing          IMAGING AND PROCEDURES  Imaging and Procedures for 04/14/17  OCT, Retina - OU - Both Eyes     Right Eye Quality was good. Central Foveal Thickness: 266. Progression has been stable. Findings include normal foveal contour, no IRF, no SRF, retinal drusen .   Left Eye Quality was poor. Central Foveal Thickness: 176. Progression has improved. Findings include no SRF, retinal drusen , abnormal foveal contour, no IRF, outer retinal atrophy, central retinal atrophy.   Notes Images taken, stored on drive  Diagnosis / Impression:  OD: NFP; no IRF/SRF; rare drusen OS: CRVO w/ CME resolved and residual atrophy; drusen  Clinical management:   See below  Abbreviations: NFP - Normal foveal profile. CME - cystoid macular edema. PED - pigment epithelial detachment. IRF - intraretinal fluid. SRF - subretinal fluid. EZ - ellipsoid zone. ERM - epiretinal membrane. ORA - outer retinal atrophy. ORT - outer retinal tubulation. SRHM - subretinal hyper-reflective material         Yag Capsulotomy - OD - Right Eye     Procedure note: YAG Capsulotomy, RIGHT Eye  Informed consent obtained. Pre-op dilating drops (1% Topicamide and 2.5% Phenylephrine), and topical anesthesia given. Power: 6.7 mJ Shots: 66 Posterior capsulotomy in can-opener formation performed without difficulty. Patient tolerated procedure well. No complications. Rx pred forte 4 times a day for 7 days, then stop. Pt received written and verbal post laser education. Recheck in 1 week w/ dilated exam.                ASSESSMENT/PLAN:    ICD-10-CM   1. PCO (posterior capsular opacification), right H26.491 Yag Capsulotomy - OD - Right Eye  2. Central retinal vein occlusion with macular edema of left eye H34.8120 OCT, Retina - OU - Both Eyes    CANCELED: Intravitreal Injection, Pharmacologic Agent - OS - Left Eye  3. Hypertensive retinopathy of both eyes H35.033   4. Pseudophakia of both eyes Z96.1   5. Dry eye syndrome of both eyes H04.123     1. PCO OD - pt reports increasing difficulty with reading and vision OD - objectively, BCVA has steadily declined OD -- 20/40 today - recommend YAG cap OD today (01.29.19) - discussed findings and prognosis - pt wishes to proceed - RBA of procedure discussed, questions answered - informed consent obtained and signed - see procedure note - start PF  OD QID x 7 days - F/U 1 week for post op check  2. CRVO w/ CME, OS   - s/p IVA OS #6 (07.24.18), s/p PRP OS (08.07.18), IVA OS #7 (11.20.18), IVA OS #8 (12.18.18)   - OCT today with significant improvement in / resolution of CME, 6 wks post injection  - recommend  repeat IVA OS today, but pt wishes to defer injection in lieu of YAG Cap OD as above  - will f/u in 1 wks -- repeat DFE/OCT, likely IVA OS, consider PRP fill in OS  3. Hypertensive retinopathy OU - discussed importance of tight BP control - monitor  4. Pseudophakia OU - s/p CE/IOL OU by Dr. Elliot Dally - beautiful surgery, doing well - monitor  5. Dry eye syndrome OU - likely visually significant and related to chief complaint of transient intermittent spots in vision - recommend artificial tears and/or lubricating ointment 4-6x/day, as needed   Ophthalmic Meds Ordered this visit:  Meds ordered this encounter  Medications  . prednisoLONE acetate (PRED FORTE) 1 % ophthalmic suspension    Sig: Place 1 drop into the right eye 4 (four) times daily for 7 days.    Dispense:  10 mL    Refill:  0       Return in about 1 week (around 04/21/2017) for F/U CRVO w/ CME OS, S/P Yag OD.  There are no Patient Instructions on file for this visit.   Explained the diagnoses, plan, and follow up with the patient and they expressed understanding.  Patient expressed understanding of the importance of proper follow up care.   This document serves as a record of services personally performed by Gardiner Sleeper, MD, PhD. It was created on their behalf by Catha Brow, [place], a certified ophthalmic assistant. The creation of this record is the provider's dictation and/or activities during the visit.  Electronically signed by: Catha Brow, COA  04/14/17 11:16 PM   Gardiner Sleeper, M.D., Ph.D. Diseases & Surgery of the Retina and [place] 04/14/17   I have reviewed the above documentation for accuracy and completeness, and I agree with the above. Gardiner Sleeper, M.D., Ph.D. 04/14/17 11:22 PM   Abbreviations: M myopia (nearsighted); A astigmatism; H hyperopia (farsighted); P presbyopia; Mrx spectacle prescription;  CTL contact lenses; OD right eye; OS left  eye; OU both eyes  XT exotropia; ET esotropia; PEK punctate epithelial keratitis; PEE punctate epithelial erosions; DES dry eye syndrome; MGD meibomian gland dysfunction; ATs artificial tears; PFAT's preservative free artificial tears; [place] nuclear sclerotic cataract; PSC posterior subcapsular cataract; ERM epi-retinal membrane; PVD posterior vitreous detachment; RD retinal detachment; DM diabetes mellitus; DR diabetic retinopathy; NPDR non-proliferative diabetic retinopathy; PDR proliferative diabetic retinopathy; CSME clinically significant macular edema; DME diabetic macular edema; dbh dot blot hemorrhages; CWS cotton wool spot; POAG primary open angle glaucoma; C/D cup-to-disc ratio; HVF humphrey visual field; GVF goldmann visual field; OCT optical coherence tomography; IOP intraocular pressure; BRVO Branch retinal vein occlusion; CRVO central retinal vein occlusion; CRAO central retinal artery occlusion; BRAO branch retinal artery occlusion; RT retinal tear; SB scleral buckle; PPV pars plana vitrectomy; VH Vitreous hemorrhage; PRP panretinal laser photocoagulation; IVK intravitreal kenalog; VMT vitreomacular traction; MH Macular hole;  NVD neovascularization of the disc; NVE neovascularization elsewhere; AREDS age related eye disease study; ARMD age related macular degeneration; POAG primary open angle glaucoma; EBMD epithelial/anterior basement membrane dystrophy; ACIOL anterior chamber intraocular lens; IOL intraocular lens; PCIOL posterior chamber intraocular  lens; Phaco/IOL phacoemulsification with intraocular lens placement; [place] photorefractive keratectomy; LASIK laser assisted in situ keratomileusis; HTN hypertension; DM diabetes mellitus; COPD chronic obstructive pulmonary disease

## 2017-04-14 ENCOUNTER — Ambulatory Visit (INDEPENDENT_AMBULATORY_CARE_PROVIDER_SITE_OTHER): Payer: Medicare Other | Admitting: Ophthalmology

## 2017-04-14 ENCOUNTER — Encounter (INDEPENDENT_AMBULATORY_CARE_PROVIDER_SITE_OTHER): Payer: Self-pay | Admitting: Ophthalmology

## 2017-04-14 DIAGNOSIS — H26491 Other secondary cataract, right eye: Secondary | ICD-10-CM

## 2017-04-14 DIAGNOSIS — Z961 Presence of intraocular lens: Secondary | ICD-10-CM

## 2017-04-14 DIAGNOSIS — H04123 Dry eye syndrome of bilateral lacrimal glands: Secondary | ICD-10-CM

## 2017-04-14 DIAGNOSIS — H34812 Central retinal vein occlusion, left eye, with macular edema: Secondary | ICD-10-CM | POA: Diagnosis not present

## 2017-04-14 DIAGNOSIS — H35033 Hypertensive retinopathy, bilateral: Secondary | ICD-10-CM | POA: Diagnosis not present

## 2017-04-14 MED ORDER — PREDNISOLONE ACETATE 1 % OP SUSP: 1.0000 [drp] | Freq: Four times a day (QID) | OPHTHALMIC | 0 refills | Status: AC

## 2017-04-17 NOTE — Progress Notes (Signed)
[facility] Note  04/22/2017     CHIEF COMPLAINT Patient presents for Retina Follow Up   HISTORY OF PRESENT ILLNESS: Christine Perez is a 84 y.o. female who presents to the clinic today for:   HPI    Retina Follow Up    Patient presents with  Other.  In both eyes.  This started 2 months ago.  Severity is mild.  Since onset it is gradually improving.  I, the attending physician,  performed the HPI with the patient and updated documentation appropriately.          Comments    F/U CRVO w/ CME OS: S/P Yag cap OD (04/14/17)Patient states her vision in her right eye is " Fuzzy", she can not read small print, but feels her vision has improved . Patient states "she is not sure if Tx in her left eye will be needed because she was told, she would not ever have clear vision of that eye". Pt uses Systane Os Prn., Pred Forte Gtts OD QID, Takes vits        Last edited by Bernarda Caffey, MD on 04/22/2017  4:25 PM. (History)    Pt states she tolerated last injection well; Pt states that she was unable to see much of a difference in OS VA since having injection;   Referring physician: Marletta Lor, MD [place], [place] [address]  HISTORICAL INFORMATION:   Selected notes from the MEDICAL RECORD NUMBER Dr. Zadie Rhine pt -- functionally monocular pt secondary to CRVO OS, concern for spot in vision OD  LEE- 10.30.18 (G. Rankin) [BCVA OD: 20/25 OS: CF @ 1'] Ocular Hx- pseudophakia OU, CRVO w/ neovascularization and CME OS s/p IVA #6 (last inj 07.24.18), S/P PRP OS (08.07.18), retinal hemorrhage OS, K scar OD, optic atrophy OS, DES OU PMH- hypothyroidism, HTN, vertigo, A-Fib, hx of breast ca, COPD, CHF, chronic anticoagulation    CURRENT MEDICATIONS: No current outpatient medications on file. (Ophthalmic Drugs)   No current facility-administered medications for this visit.  (Ophthalmic Drugs)   Current Outpatient Medications (Other)  Medication Sig   . acetaminophen (TYLENOL) 500 MG tablet Take 500 mg by mouth every 6 (six) hours as needed for moderate pain or headache.  Marland Kitchen amiodarone (PACERONE) 200 MG tablet Take 1.5 tablets (300 mg total) by mouth daily.  Marland Kitchen amiodarone (PACERONE) 200 MG tablet TAKE 1 TABLET BY MOUTH TWICE DAILY  . calcium-vitamin D (OSCAL WITH D) 500-200 MG-UNIT per tablet Take 1 tablet by mouth 2 (two) times daily.   . furosemide (LASIX) 20 MG tablet Take 1 tablet (20 mg total) by mouth daily.  Marland Kitchen ipratropium (ATROVENT HFA) 17 MCG/ACT inhaler Inhale 2 puffs into the lungs every 4 (four) hours as needed for wheezing.  Marland Kitchen losartan (COZAAR) 100 MG tablet TAKE 1 TABLET BY MOUTH ONCE DAILY  . metoprolol succinate (TOPROL-XL) 50 MG 24 hr tablet TAKE 1 TABLET BY MOUTH TWICE DAILY  . Multiple Vitamin (MULTIVITAMIN) tablet Take 1 tablet by mouth daily.  Marland Kitchen spironolactone (ALDACTONE) 25 MG tablet Take 12.5 mg by mouth daily.  Marland Kitchen spironolactone (ALDACTONE) 25 MG tablet TAKE 1/2 (ONE-HALF) TABLET BY MOUTH ONCE DAILY  . SYNTHROID 75 MCG tablet TAKE 1 TABLET BY MOUTH ONCE DAILY  . traMADol (ULTRAM) 50 MG tablet Take 1 tablet (50 mg total) by mouth 2 (two) times daily as needed (pain).  . vitamin C (ASCORBIC ACID) 500 MG tablet Take 500 mg by mouth daily.  Marland Kitchen  XARELTO 15 MG TABS tablet TAKE 1 TABLET BY MOUTH ONCE DAILY WITH SUPPER  . zolpidem (AMBIEN) 5 MG tablet TAKE ONE TABLET BY MOUTH AT BEDTIME   Current Facility-Administered Medications (Other)  Medication Route  . Bevacizumab (AVASTIN) SOLN 1.25 mg Intravitreal  . Bevacizumab (AVASTIN) SOLN 1.25 mg Intravitreal      REVIEW OF SYSTEMS: ROS    Positive for: Genitourinary, Musculoskeletal, Endocrine, Cardiovascular, Eyes, Respiratory   Negative for: Constitutional, Gastrointestinal, Neurological, Skin, HENT, Psychiatric, Allergic/Imm, Heme/Lymph   Last edited by Zenovia Jordan, LPN on 05/21/6438  3:47 PM. (History)       ALLERGIES Allergies  Allergen Reactions  . Codeine  Shortness Of Breath    Difficulty breathing    PAST MEDICAL HISTORY Past Medical History:  Diagnosis Date  . Atrial fibrillation ([place])   . CARDIAC ARRHYTHMIA 01/08/2010  . COLONIC POLYPS, HX OF 10/09/2006  . COPD (chronic obstructive pulmonary disease) ([place])   . HYPERLIPIDEMIA 10/09/2006  . HYPERTENSION 10/09/2006  . HYPOTHYROIDISM 10/09/2006  . LASSITUDE 08/15/2009  . OSTEOPOROSIS 10/09/2006  . VERTIGO 08/07/2009  . Wears glasses   . Wears hearing aid    left   Past Surgical History:  Procedure Laterality Date  . ABDOMINAL HYSTERECTOMY    . APPENDECTOMY    . BREAST SURGERY     left - lumpectomy  . CARDIOVERSION N/A 08/09/2013   Procedure: CARDIOVERSION;  Surgeon: Candee Furbish, MD;  Location: [facility] ENDOSCOPY;  Service: Cardiovascular;  Laterality: N/A;  . CATARACT EXTRACTION    . CHOLECYSTECTOMY    . PACEMAKER PLACEMENT N/A 12/05/2013  . PARTIAL MASTECTOMY WITH NEEDLE LOCALIZATION Left 06/29/2012   Procedure: PARTIAL MASTECTOMY WITH NEEDLE LOCALIZATION;  Surgeon: Adin Hector, MD;  Location: [facility];  Service: General;  Laterality: Left;  . TONSILLECTOMY    . YAG LASER APPLICATION      FAMILY HISTORY Family History  Problem Relation Age of Onset  . Cancer Mother   . Heart failure Father   . Cancer Sister   . Stroke Sister     SOCIAL HISTORY Social History   Tobacco Use  . Smoking status: Never Smoker  . Smokeless tobacco: Never Used  Substance Use Topics  . Alcohol use: No  . Drug use: No         OPHTHALMIC EXAM:  Base Eye Exam    Visual Acuity (Snellen - Linear)      Right Left   Dist cc 20/25 -2 HM   Dist ph cc NI    Correction:  Glasses       Tonometry (Tonopen, 2:56 PM)      Right Left   Pressure 13 13       Pupils      Dark Light Shape React APD   Right 4 2 Round Brisk None   Left 4 4 Round  +3       Visual Fields (Counting fingers)      Left Right     Full   Restrictions Total superior temporal, inferior temporal,  superior nasal, inferior nasal deficiencies        Extraocular Movement      Right Left    Full, Ortho Full, Ortho       Neuro/Psych    Oriented x3:  Yes   Mood/Affect:  Normal       Dilation    Both eyes:  1.0% Mydriacyl, 2.5% Phenylephrine @ 2:56 PM        Slit Lamp and  Fundus Exam    External Exam      Right Left   External Normal Normal       Slit Lamp Exam      Right Left   Lids/Lashes UL dermato UL dermato   Conjunctiva/Sclera White and quiet White and quiet   Cornea 2+ PEE; arcus; tear film debris 2-3+ PEE; arcus; tear film debris   Anterior Chamber Deep and quiet Deep and quiet   Iris Round, dilated Round, dilated, No NVI   Lens PCIOL; open PC PCIOL   Vitreous syneresis syneresis       Fundus Exam      Right Left   Disc Normal mild pallor, peripapillary pigmentation temporally, blot and flame hemorrhages at 1200 - improved   C/D Ratio 0.3 0.3   Macula flat, Retinal pigment epithelial mottling, Drusen blot and flame hemorrhages - improved, CME resolved, Drusen   Vessels Normal Vascular attenuation, Tortuous   Periphery Attached, mild reticular degeneration nasally attached with 360 PRP - some room for fill in, intra retinal hemorrhage - clearing          IMAGING AND PROCEDURES  Imaging and Procedures for 04/22/17  OCT, Retina - OU - Both Eyes     Right Eye Quality was good. Central Foveal Thickness: 271. Progression has been stable. Findings include normal foveal contour, no IRF, no SRF, retinal drusen .   Left Eye Quality was poor. Central Foveal Thickness: 129. Progression has been stable. Findings include no SRF, retinal drusen , abnormal foveal contour, no IRF, outer retinal atrophy, central retinal atrophy.   Notes Images taken, stored on drive  Diagnosis / Impression:  OD: NFP; no IRF/SRF; rare drusen OS: CRVO w/ CME resolved and residual atrophy; drusen  Clinical management:  See below  Abbreviations: NFP - Normal foveal profile. CME -  cystoid macular edema. PED - pigment epithelial detachment. IRF - intraretinal fluid. SRF - subretinal fluid. EZ - ellipsoid zone. ERM - epiretinal membrane. ORA - outer retinal atrophy. ORT - outer retinal tubulation. SRHM - subretinal hyper-reflective material         Intravitreal Injection, Pharmacologic Agent - OS - Left Eye     Time Out 04/22/2017. 3:39 PM. Confirmed correct patient, procedure, site, and patient consented.   Anesthesia Topical anesthesia was used. Anesthetic medications included Tetracaine 0.5%, Lidocaine 2%.   Procedure Preparation included 5% betadine to ocular surface. A supplied needle was used.   Injection: 1.25 mg Bevacizumab 1.25mg /0.52ml   NDC: 47654-650-35    Lot: [id number]     Expiration Date: 06/03/2017   Route: Intravitreal   Site: Left Eye   Waste: 0 mg  Post-op Post injection exam found visual acuity of at least counting fingers. The patient tolerated the procedure well. There were no complications. The patient received written and verbal post procedure care education.                 ASSESSMENT/PLAN:    ICD-10-CM   1. PCO (posterior capsular opacification), right H26.491   2. Central retinal vein occlusion with macular edema of left eye H34.8120 OCT, Retina - OU - Both Eyes    Intravitreal Injection, Pharmacologic Agent - OS - Left Eye    Bevacizumab (AVASTIN) SOLN 1.25 mg  3. Hypertensive retinopathy of both eyes H35.033   4. Pseudophakia of both eyes Z96.1   5. Dry eye syndrome of both eyes H04.123   6. Retinal edema H35.81     1. CRVO w/ CME, OS  -  s/p IVA OS #6 (07.24.18 - [facility]in), s/p PRP OS (08.07.18 - Rankin), IVA OS #7 (11.20.18), IVA OS #8 (12.18.18) - 7 week interval  - OCT today with significant improvement / resolution of CME, 7 wks post injection  - recommend IVA OS #9 today (02.06.19) -- increase interval to 8 wks  - RBA of procedure discussed, questions answered  - informed consent obtained and signed  - see  procedure note  - will f/u in 8 wks -- repeat DFE/OCT, likely IVA OS, consider PRP fill in OS  2. PCO OD- - S/P Yag Cap OD (01.29.19) - looks great - VA improved to 20/25 from 20/40 OD  3. Hypertensive retinopathy OU - discussed importance of tight BP control - monitor  4. Pseudophakia OU - s/p CE/IOL OU by Dr. Elliot Dally - beautiful surgery, doing well - monitor  5. Dry eye syndrome OU - likely visually significant and related to chief complaint of transient intermittent spots in vision - recommend artificial tears and/or lubricating ointment 4-6x/day, as needed   Ophthalmic Meds Ordered this visit:  Meds ordered this encounter  Medications  . Bevacizumab (AVASTIN) SOLN 1.25 mg       Return in about 8 weeks (around 06/17/2017) for F/U CRVO w/ CME OS, Dilated exam, OCT, poss injxn.  There are no Patient Instructions on file for this visit.   Explained the diagnoses, plan, and follow up with the patient and they expressed understanding.  Patient expressed understanding of the importance of proper follow up care.   This document serves as a record of services personally performed by Gardiner Sleeper, MD, PhD. It was created on their behalf by Catha Brow, [place], a certified ophthalmic assistant. The creation of this record is the provider's dictation and/or activities during the visit.  Electronically signed by: Catha Brow, [place]  04/22/17 4:30 PM    Gardiner Sleeper, M.D., Ph.D. Diseases & Surgery of the Retina and [place] 04/22/17   I have reviewed the above documentation for accuracy and completeness, and I agree with the above. Gardiner Sleeper, M.D., Ph.D. 04/22/17 4:30 PM     Abbreviations: M myopia (nearsighted); A astigmatism; H hyperopia (farsighted); P presbyopia; Mrx spectacle prescription;  CTL contact lenses; OD right eye; OS left eye; OU both eyes  XT exotropia; ET esotropia; PEK punctate epithelial keratitis; PEE  punctate epithelial erosions; DES dry eye syndrome; MGD meibomian gland dysfunction; ATs artificial tears; PFAT's preservative free artificial tears; [place] nuclear sclerotic cataract; PSC posterior subcapsular cataract; ERM epi-retinal membrane; PVD posterior vitreous detachment; RD retinal detachment; DM diabetes mellitus; DR diabetic retinopathy; NPDR non-proliferative diabetic retinopathy; PDR proliferative diabetic retinopathy; CSME clinically significant macular edema; DME diabetic macular edema; dbh dot blot hemorrhages; CWS cotton wool spot; POAG primary open angle glaucoma; C/D cup-to-disc ratio; HVF humphrey visual field; GVF goldmann visual field; OCT optical coherence tomography; IOP intraocular pressure; BRVO Branch retinal vein occlusion; CRVO central retinal vein occlusion; CRAO central retinal artery occlusion; BRAO branch retinal artery occlusion; RT retinal tear; SB scleral buckle; PPV pars plana vitrectomy; VH Vitreous hemorrhage; PRP panretinal laser photocoagulation; IVK intravitreal kenalog; VMT vitreomacular traction; MH Macular hole;  NVD neovascularization of the disc; NVE neovascularization elsewhere; AREDS age related eye disease study; ARMD age related macular degeneration; POAG primary open angle glaucoma; EBMD epithelial/anterior basement membrane dystrophy; ACIOL anterior chamber intraocular lens; IOL intraocular lens; PCIOL posterior chamber intraocular lens; Phaco/IOL phacoemulsification with intraocular lens placement; PRK photorefractive keratectomy; LASIK  laser assisted in situ keratomileusis; HTN hypertension; DM diabetes mellitus; COPD chronic obstructive pulmonary disease

## 2017-04-18 ENCOUNTER — Other Ambulatory Visit: Payer: Self-pay | Admitting: Cardiovascular Disease

## 2017-04-20 NOTE — Telephone Encounter (Signed)
Rx request sent to pharmacy.  

## 2017-04-22 ENCOUNTER — Ambulatory Visit (INDEPENDENT_AMBULATORY_CARE_PROVIDER_SITE_OTHER): Payer: Medicare Other | Admitting: Ophthalmology

## 2017-04-22 ENCOUNTER — Encounter (INDEPENDENT_AMBULATORY_CARE_PROVIDER_SITE_OTHER): Payer: Self-pay | Admitting: Ophthalmology

## 2017-04-22 DIAGNOSIS — H34812 Central retinal vein occlusion, left eye, with macular edema: Secondary | ICD-10-CM

## 2017-04-22 DIAGNOSIS — H35033 Hypertensive retinopathy, bilateral: Secondary | ICD-10-CM

## 2017-04-22 DIAGNOSIS — H04123 Dry eye syndrome of bilateral lacrimal glands: Secondary | ICD-10-CM

## 2017-04-22 DIAGNOSIS — H3581 Retinal edema: Secondary | ICD-10-CM

## 2017-04-22 DIAGNOSIS — H26491 Other secondary cataract, right eye: Secondary | ICD-10-CM

## 2017-04-22 DIAGNOSIS — Z961 Presence of intraocular lens: Secondary | ICD-10-CM

## 2017-04-22 MED ORDER — BEVACIZUMAB CHEMO INJECTION 1.25MG/0.05ML SYRINGE FOR KALEIDOSCOPE
1.2500 mg | INTRAVITREAL | Status: AC
  Administered 2017-04-22: 1.25 mg via INTRAVITREAL

## 2017-04-23 ENCOUNTER — Encounter: Payer: Medicare Other | Admitting: *Deleted

## 2017-04-23 ENCOUNTER — Telehealth: Payer: Self-pay | Admitting: Cardiology

## 2017-04-23 NOTE — Telephone Encounter (Signed)
LMOVM reminding pt to send remote transmission.   

## 2017-04-29 ENCOUNTER — Encounter: Payer: Self-pay | Admitting: Cardiology

## 2017-05-03 ENCOUNTER — Emergency Department (HOSPITAL_COMMUNITY): Payer: Medicare Other

## 2017-05-03 ENCOUNTER — Other Ambulatory Visit: Payer: Self-pay

## 2017-05-03 ENCOUNTER — Emergency Department (HOSPITAL_COMMUNITY)
Admission: EM | Admit: 2017-05-03 | Discharge: 2017-05-03 | Disposition: A | Discharge status: Home / Self Care | Payer: Medicare Other | Attending: Emergency Medicine | Admitting: Emergency Medicine

## 2017-05-03 ENCOUNTER — Encounter (HOSPITAL_COMMUNITY): Payer: Self-pay

## 2017-05-03 DIAGNOSIS — W010XXA Fall on same level from slipping, tripping and stumbling without subsequent striking against object, initial encounter: Secondary | ICD-10-CM | POA: Diagnosis not present

## 2017-05-03 DIAGNOSIS — Z950 Presence of cardiac pacemaker: Secondary | ICD-10-CM | POA: Insufficient documentation

## 2017-05-03 DIAGNOSIS — Y92000 Kitchen of unspecified non-institutional (private) residence as  the place of occurrence of the external cause: Secondary | ICD-10-CM | POA: Diagnosis not present

## 2017-05-03 DIAGNOSIS — S20211A Contusion of right front wall of thorax, initial encounter: Secondary | ICD-10-CM | POA: Insufficient documentation

## 2017-05-03 DIAGNOSIS — Z7901 Long term (current) use of anticoagulants: Secondary | ICD-10-CM | POA: Diagnosis not present

## 2017-05-03 DIAGNOSIS — I509 Heart failure, unspecified: Secondary | ICD-10-CM | POA: Insufficient documentation

## 2017-05-03 DIAGNOSIS — Z79899 Other long term (current) drug therapy: Secondary | ICD-10-CM | POA: Insufficient documentation

## 2017-05-03 DIAGNOSIS — M5489 Other dorsalgia: Secondary | ICD-10-CM | POA: Diagnosis not present

## 2017-05-03 DIAGNOSIS — R52 Pain, unspecified: Secondary | ICD-10-CM | POA: Diagnosis not present

## 2017-05-03 DIAGNOSIS — S0990XA Unspecified injury of head, initial encounter: Secondary | ICD-10-CM | POA: Insufficient documentation

## 2017-05-03 DIAGNOSIS — Y999 Unspecified external cause status: Secondary | ICD-10-CM | POA: Diagnosis not present

## 2017-05-03 DIAGNOSIS — Y93G3 Activity, cooking and baking: Secondary | ICD-10-CM | POA: Insufficient documentation

## 2017-05-03 DIAGNOSIS — T148XXA Other injury of unspecified body region, initial encounter: Secondary | ICD-10-CM

## 2017-05-03 DIAGNOSIS — J449 Chronic obstructive pulmonary disease, unspecified: Secondary | ICD-10-CM | POA: Diagnosis not present

## 2017-05-03 DIAGNOSIS — S298XXA Other specified injuries of thorax, initial encounter: Secondary | ICD-10-CM

## 2017-05-03 DIAGNOSIS — I110 Hypertensive heart disease with heart failure: Secondary | ICD-10-CM | POA: Insufficient documentation

## 2017-05-03 DIAGNOSIS — W19XXXA Unspecified fall, initial encounter: Secondary | ICD-10-CM

## 2017-05-03 DIAGNOSIS — S299XXA Unspecified injury of thorax, initial encounter: Secondary | ICD-10-CM | POA: Diagnosis not present

## 2017-05-03 MED ORDER — LIDOCAINE 5 % EX PTCH
1.0000 | MEDICATED_PATCH | CUTANEOUS | Status: DC
  Administered 2017-05-03: 1 via TRANSDERMAL
  Filled 2017-05-03: qty 1

## 2017-05-03 MED ORDER — DICLOFENAC SODIUM 1 % TD GEL: 4.0000 g | Freq: Four times a day (QID) | TRANSDERMAL | 2 refills | Status: AC

## 2017-05-03 MED ORDER — LIDOCAINE 5 % EX PTCH: 1.0000 | MEDICATED_PATCH | CUTANEOUS | 1 refills | Status: AC

## 2017-05-03 MED ORDER — ACETAMINOPHEN 500 MG PO TABS
1000.0000 mg | ORAL_TABLET | Freq: Once | ORAL | Status: AC
  Administered 2017-05-03: 1000 mg via ORAL
  Filled 2017-05-03: qty 2

## 2017-05-03 NOTE — ED Provider Notes (Signed)
[facility] DEPT Provider Note   CSN: [id number] Arrival date & time: 05/03/17  1520     History   Chief Complaint Chief Complaint  Patient presents with  . Fall    HPI Christine Perez is a 84 y.o. female.  Pt is an 84 y/o female with hx of a fib on xarelto, COPD, CHF, HTN, HLD who is presenting after a fall on Friday.  Pt was making green beans in her kitchen and she turned around and her knees gave way and she fell on the floor.  She hit her head on the kitchen cabinet and then landed on there right ribs on the floor.  She denies LOC, palpitations, chest pain, SOB or dizziness prior to or after the fall.  Pt states yesterday she had pain in her right side but worse today.  Took some tylenol this morning but did not seem to help.  The worst pain is when she tries to stand or pushing up on her arms.  No pain in the legs or hips.  No abd pain.  Denies SOB but pain is worse with deep breathing.   The history is provided by the patient.  Fall  This is a recurrent problem. The current episode started 2 days ago. The problem occurs constantly. The problem has been gradually worsening. Associated symptoms include chest pain. Associated symptoms comments: Right side pain. Exacerbated by: pushing up to a stand, certain positions and deep breathing. The symptoms are relieved by rest. She has tried acetaminophen for the symptoms. The treatment provided no relief.    Past Medical History:  Diagnosis Date  . Atrial fibrillation ([place])   . CARDIAC ARRHYTHMIA 01/08/2010  . COLONIC POLYPS, HX OF 10/09/2006  . COPD (chronic obstructive pulmonary disease) ([place])   . HYPERLIPIDEMIA 10/09/2006  . HYPERTENSION 10/09/2006  . HYPOTHYROIDISM 10/09/2006  . LASSITUDE 08/15/2009  . OSTEOPOROSIS 10/09/2006  . VERTIGO 08/07/2009  . Wears glasses   . Wears hearing aid    left    Patient Active Problem List   Diagnosis Date Noted  . Acute on chronic diastolic heart failure ([place])  03/18/2016  . CHF exacerbation ([place]) 03/02/2016  . COPD with acute exacerbation ([place]) 03/02/2016  . Hypertensive urgency 03/02/2016  . Pleural effusion 03/02/2016  . SSS (sick sinus syndrome) ([place]) 03/02/2016  . CHF (congestive heart failure) ([place]) 03/02/2016  . Chronic anticoagulation 08/03/2013  . Atrial fibrillation ([place]) 08/02/2013  . Lobular carcinoma in situ of left breast 06/03/2012  . LASSITUDE 08/15/2009  . VERTIGO 08/07/2009  . Hypothyroidism 10/09/2006  . Dyslipidemia 10/09/2006  . Essential hypertension 10/09/2006  . Osteoporosis 10/09/2006  . History of colonic polyps 10/09/2006    Past Surgical History:  Procedure Laterality Date  . ABDOMINAL HYSTERECTOMY    . APPENDECTOMY    . BREAST SURGERY     left - lumpectomy  . CARDIOVERSION N/A 08/09/2013   Procedure: CARDIOVERSION;  Surgeon: Candee Furbish, MD;  Location: [facility] ENDOSCOPY;  Service: Cardiovascular;  Laterality: N/A;  . CATARACT EXTRACTION    . CHOLECYSTECTOMY    . PACEMAKER PLACEMENT N/A 12/05/2013  . PARTIAL MASTECTOMY WITH NEEDLE LOCALIZATION Left 06/29/2012   Procedure: PARTIAL MASTECTOMY WITH NEEDLE LOCALIZATION;  Surgeon: Adin Hector, MD;  Location: [facility];  Service: General;  Laterality: Left;  . TONSILLECTOMY    . YAG LASER APPLICATION      OB History    No data available       Home  Medications    Prior to Admission medications   Medication Sig Start Date End Date Taking? Authorizing Provider  acetaminophen (TYLENOL) 500 MG tablet Take 500 mg by mouth every 6 (six) hours as needed for moderate pain or headache.    [provider]  amiodarone (PACERONE) 200 MG tablet Take 1.5 tablets (300 mg total) by mouth daily. 08/04/16   Troy Sine, MD  amiodarone (PACERONE) 200 MG tablet TAKE 1 TABLET BY MOUTH TWICE DAILY 03/09/17   Troy Sine, MD  calcium-vitamin D (OSCAL WITH D) 500-200 MG-UNIT per tablet Take 1 tablet by mouth 2 (two) times daily.     [provider]  furosemide (LASIX) 20 MG tablet Take 1 tablet (20 mg total) by mouth daily. 08/12/16   Marletta Lor, MD  ipratropium (ATROVENT HFA) 17 MCG/ACT inhaler Inhale 2 puffs into the lungs every 4 (four) hours as needed for wheezing. 03/18/16   Marletta Lor, MD  losartan (COZAAR) 100 MG tablet TAKE 1 TABLET BY MOUTH ONCE DAILY 03/16/17   Marletta Lor, MD  metoprolol succinate (TOPROL-XL) 50 MG 24 hr tablet TAKE 1 TABLET BY MOUTH TWICE DAILY 04/20/17   Troy Sine, MD  Multiple Vitamin (MULTIVITAMIN) tablet Take 1 tablet by mouth daily.    [provider]  spironolactone (ALDACTONE) 25 MG tablet Take 12.5 mg by mouth daily. 01/14/17   [provider]  spironolactone (ALDACTONE) 25 MG tablet TAKE 1/2 (ONE-HALF) TABLET BY MOUTH ONCE DAILY 04/06/17   Troy Sine, MD  SYNTHROID 75 MCG tablet TAKE 1 TABLET BY MOUTH ONCE DAILY 02/16/17   Marletta Lor, MD  traMADol (ULTRAM) 50 MG tablet Take 1 tablet (50 mg total) by mouth 2 (two) times daily as needed (pain). 03/26/17   Marletta Lor, MD  vitamin C (ASCORBIC ACID) 500 MG tablet Take 500 mg by mouth daily.    [provider]  XARELTO 15 MG TABS tablet TAKE 1 TABLET BY MOUTH ONCE DAILY WITH SUPPER 03/11/17   Marletta Lor, MD  zolpidem (AMBIEN) 5 MG tablet TAKE ONE TABLET BY MOUTH AT BEDTIME 11/03/16   Marletta Lor, MD  metoprolol tartrate (LOPRESSOR) 25 MG tablet Take 1 tablet (25 mg total) by mouth 2 (two) times daily. 09/05/13 09/14/13  Jerline Pain, MD    Family History Family History  Problem Relation Age of Onset  . Cancer Mother   . Heart failure Father   . Cancer Sister   . Stroke Sister     Social History Social History   Tobacco Use  . Smoking status: Never Smoker  . Smokeless tobacco: Never Used  Substance Use Topics  . Alcohol use: No  . Drug use: No     Allergies   Codeine   Review of Systems Review of Systems  Cardiovascular:  Positive for chest pain.  All other systems reviewed and are negative.    Physical Exam Updated Vital Signs BP 117/68   Pulse 69   Temp 98.4 F (36.9 C) (Oral)   Resp 16   Ht 5' (1.524 m)   Wt 59 kg (130 lb)   SpO2 94%   BMI 25.39 kg/m   Physical Exam  Constitutional: She is oriented to person, place, and time. She appears well-developed and well-nourished. No distress.  HENT:  Head: Normocephalic and atraumatic.  Mouth/Throat: Oropharynx is clear and moist.  Eyes: Conjunctivae and EOM are normal. Pupils are equal, round, and reactive to light.  Neck: Normal range of motion. Neck supple.  Cardiovascular: Normal rate, regular rhythm and intact distal pulses.  No murmur heard. Pulmonary/Chest: Effort normal and breath sounds normal. No respiratory distress. She has no wheezes. She has no rales.     She exhibits tenderness and bony tenderness. She exhibits no crepitus, no edema, no swelling and no retraction.    Abdominal: Soft. She exhibits no distension. There is no tenderness. There is no rebound and no guarding.  Musculoskeletal: Normal range of motion. She exhibits no edema or tenderness.       Right shoulder: Normal.       Left shoulder: Normal.  No midline spinal tenderness.  Able to range bilateral hips and knees passively and actively without difficulty.  Neurological: She is alert and oriented to person, place, and time.  Skin: Skin is warm and dry. No rash noted. No erythema.  Psychiatric: She has a normal mood and affect. Her behavior is normal.  Nursing note and vitals reviewed.    ED Treatments / Results  Labs (all labs ordered are listed, but only abnormal results are displayed) Labs Reviewed - No data to display  EKG  EKG Interpretation None       Radiology Dg Ribs Unilateral W/chest Right  Result Date: 05/03/2017 CLINICAL DATA:  Fall on Friday, found down. Pain to anterior lower right ribs. History of atrial fibrillation, CHF, hypertension,  pacemaker. EXAM: RIGHT RIBS AND CHEST - 3+ VIEW COMPARISON:  Chest x-ray dated 08/01/2016. FINDINGS: Cardiomegaly is stable. Atherosclerotic changes noted at the aortic arch. Lungs are clear. No pleural effusion or pneumothorax seen. Left chest wall pacemaker leads appear intact and stable in position. Osseous structures about the chest are unremarkable. No right-sided rib fracture or displacement identified. IMPRESSION: 1. No acute findings.  No evidence of pneumonia or pulmonary edema. 2. No rib fracture or displacement seen. 3. Stable cardiomegaly. 4. Aortic atherosclerosis. Electronically Signed   By: Franki Cabot M.D.   On: 05/03/2017 17:24   Ct Head Wo Contrast  Result Date: 05/03/2017 CLINICAL DATA:  Fall on Friday.  Found down.  Head injury. EXAM: CT HEAD WITHOUT CONTRAST TECHNIQUE: Contiguous axial images were obtained from the base of the skull through the vertex without intravenous contrast. COMPARISON:  Head CT dated 11/06/2014. FINDINGS: Brain: Mild generalized age related parenchymal atrophy with commensurate dilatation of the ventricles and sulci. Chronic small vessel ischemic changes again noted within the bilateral periventricular and subcortical white matter regions. Small old lacunar infarcts within the basal ganglia regions bilaterally. Small old infarct within the right occipital lobe with associated developing encephalomalacia. No mass, hemorrhage, edema or other evidence of acute parenchymal abnormality. No extra-axial hemorrhage. Vascular: There are chronic calcified atherosclerotic changes of the large vessels at the skull base. No unexpected hyperdense vessel. Skull: Normal. Negative for fracture or focal lesion. Sinuses/Orbits: No acute finding. Other: None. IMPRESSION: 1. No acute findings. No intracranial mass, hemorrhage or edema. No skull fracture. 2. Chronic ischemic changes, as detailed above. Electronically Signed   By: Franki Cabot M.D.   On: 05/03/2017 17:27     Procedures Procedures (including critical care time)  Medications Ordered in ED Medications  lidocaine (LIDODERM) 5 % 1 patch (not administered)  acetaminophen (TYLENOL) tablet 1,000 mg (not administered)     Initial Impression / Assessment and Plan / ED Course  I have reviewed the triage vital signs and the nursing notes.  Pertinent labs & imaging results that were available during my care of the  patient were reviewed by me and considered in my medical decision making (see chart for details).     Elderly female presenting with a mechanical fall on Friday with persistent pain in her right rib area.  She has no bruising to the area and no sx to suggest abd pathology or spinal pathology.  Pt has no pain in the legs or hips concerning for fracture.  Pt did hit her head on a cabinet while falling and she is on xarelto.  Pt denies HA, LOC and had no prodromal sx concerning for cardiac cause of fall. Concern for possible rib fracture.  VS are reassuring.  Pt given tylenol and lidoderm patch due to intolerance to opiates.  Head CT and CXR with rib films pending.  6:02 PM Imaging without acute issues.  Pt d/ced with lidoderm patch, voltaren and tylenol.  Final Clinical Impressions(s) / ED Diagnoses   Final diagnoses:  Fall, initial encounter  Contusion of muscle  Contusion of rib on right side, initial encounter    ED Discharge Orders        Ordered    diclofenac sodium (VOLTAREN) 1 % GEL  4 times daily     05/03/17 1805    lidocaine (LIDODERM) 5 %  Every 24 hours     05/03/17 1805       Blanchie Dessert, MD 05/03/17 1805

## 2017-05-03 NOTE — Discharge Instructions (Signed)
Take 2 extra strength tylenol every 4-6 hours while having pain.  Rub the voltaren gel on the area that hurts up to 4 times a day and apply the lidocaine patch for up to 12 hours daily.

## 2017-05-03 NOTE — ED Notes (Signed)
Bed: GE72 Expected date:  Expected time:  Means of arrival:  Comments: 84 yo fall

## 2017-05-03 NOTE — ED Triage Notes (Signed)
EMS reports from home fall on Friday, found on kitchen floor, Pt states head hit on cabinet, denies head pain or LOC, c/o right flank pain, no obvious injury. Pt ambulates with assist. Hx of A-fib, CHF, Hypertension, Pacemaker  BP 132/76 Hr 72 Resp 17 Sp02 93 RA

## 2017-05-05 ENCOUNTER — Telehealth: Payer: Self-pay | Admitting: Internal Medicine

## 2017-05-05 NOTE — Telephone Encounter (Signed)
Calling back and wanting doctor to know she is in a lot pain and did not sleep last night.     [facility] [address], [place]  [place] [address]  Phone: [phone] Fax: [phone]

## 2017-05-05 NOTE — Telephone Encounter (Signed)
Copied from [facility]. Topic: Quick Communication - See Telephone Encounter >> May 05, 2017  9:27 AM Hewitt Shorts wrote: CRM for notification. See Telephone encounter for: pt daughter is calling to discuss pt visit to the emergency room due to a fall  and what they gave her and the hoping to discuss getting something else for pain -having pain around ribs and radiating to her back and she does not want tramadol   Best number [phone] [facility]   05/05/17.

## 2017-05-06 MED ORDER — DICLOFENAC SODIUM 75 MG PO TBEC: 75.0000 mg | DELAYED_RELEASE_TABLET | Freq: Two times a day (BID) | ORAL | 0 refills | Status: DC

## 2017-05-06 NOTE — Telephone Encounter (Signed)
Pt called to get an update on the meds, pt was notified on the notes that were made, pt would like to speak w/ the nurse when possible

## 2017-05-06 NOTE — Telephone Encounter (Signed)
Spoke with patient daughter informed her of Dr.K's orders. Patient can take Aleve and extra strength Acetaminophen. Lidocaine patches are not covered under patient insurance. Patient daughter will find out another prescription similar to the patches and return phone call.

## 2017-05-06 NOTE — Telephone Encounter (Signed)
Okay to call in a prescription for lidocaine patch to be applied topically every 6 hours

## 2017-05-06 NOTE — Telephone Encounter (Signed)
Okay for patient to use topical Voltaren gel Okay for patient to use Aleve twice daily Continue tramadol if patient feels this is helpful  Patient has a codeine allergy and other analgesics are contraindicated

## 2017-05-06 NOTE — Telephone Encounter (Signed)
Patient's daughter [name] calling again to inquire about getting pain medicine for her mom. Her mom does not want to take Tramedol.

## 2017-05-06 NOTE — Telephone Encounter (Signed)
Medication sent in electronically.  

## 2017-05-07 ENCOUNTER — Telehealth: Payer: Self-pay | Admitting: *Deleted

## 2017-05-07 NOTE — Telephone Encounter (Signed)
No p.o. diclofenac.  Patient had a prior prescription for topical diclofenac which was [facility]

## 2017-05-07 NOTE — Telephone Encounter (Signed)
Notes to prescriber from pharmacy: patient is on Xarelto, do you still want her to take Diclofenac 75 MG DR TAB, 1 tab twice daily. Pharmacy needs clarification.

## 2017-05-08 ENCOUNTER — Telehealth: Payer: Self-pay | Admitting: *Deleted

## 2017-05-08 NOTE — Telephone Encounter (Signed)
Message sent to Dr Burnice Logan as prior Josem Kaufmann request was sent on Rx for Lidoderm patches.  According to the pts chart this was given by the ER on 05/03/2017 by Dr Maryan Rued, is this [facility] to request under your name?

## 2017-05-11 ENCOUNTER — Other Ambulatory Visit: Payer: Self-pay | Admitting: Internal Medicine

## 2017-05-11 MED ORDER — LIDOCAINE 5 % EX PTCH: 1.0000 | MEDICATED_PATCH | CUTANEOUS | 0 refills | Status: AC

## 2017-05-11 NOTE — Telephone Encounter (Signed)
Yes okay for Lidoderm patches

## 2017-05-11 NOTE — Telephone Encounter (Signed)
Lidocaine patches sent in electronically.

## 2017-05-11 NOTE — Telephone Encounter (Signed)
Informed patient daughter that the lidoderm patches was sent in to preferred pharmacy electronically. Patient's daughter also stated that the patient is having constipation. I informed the daughter that she can take Miralax twice a day and back off when bowels are regular again. "per Dr.Kwiatkowski"

## 2017-05-14 ENCOUNTER — Encounter (HOSPITAL_COMMUNITY): Payer: Self-pay

## 2017-05-14 ENCOUNTER — Emergency Department (HOSPITAL_COMMUNITY)
Admission: EM | Admit: 2017-05-14 | Discharge: 2017-05-14 | Disposition: A | Discharge status: Home / Self Care | Payer: Medicare Other | Attending: Emergency Medicine | Admitting: Emergency Medicine

## 2017-05-14 ENCOUNTER — Other Ambulatory Visit: Payer: Self-pay

## 2017-05-14 ENCOUNTER — Emergency Department (HOSPITAL_COMMUNITY): Payer: Medicare Other

## 2017-05-14 DIAGNOSIS — Y939 Activity, unspecified: Secondary | ICD-10-CM | POA: Diagnosis not present

## 2017-05-14 DIAGNOSIS — S32040A Wedge compression fracture of fourth lumbar vertebra, initial encounter for closed fracture: Secondary | ICD-10-CM | POA: Insufficient documentation

## 2017-05-14 DIAGNOSIS — I4891 Unspecified atrial fibrillation: Secondary | ICD-10-CM | POA: Diagnosis not present

## 2017-05-14 DIAGNOSIS — E785 Hyperlipidemia, unspecified: Secondary | ICD-10-CM | POA: Insufficient documentation

## 2017-05-14 DIAGNOSIS — E039 Hypothyroidism, unspecified: Secondary | ICD-10-CM | POA: Diagnosis not present

## 2017-05-14 DIAGNOSIS — Z79899 Other long term (current) drug therapy: Secondary | ICD-10-CM | POA: Diagnosis not present

## 2017-05-14 DIAGNOSIS — I5032 Chronic diastolic (congestive) heart failure: Secondary | ICD-10-CM | POA: Insufficient documentation

## 2017-05-14 DIAGNOSIS — S3992XA Unspecified injury of lower back, initial encounter: Secondary | ICD-10-CM | POA: Diagnosis not present

## 2017-05-14 DIAGNOSIS — Y998 Other external cause status: Secondary | ICD-10-CM | POA: Insufficient documentation

## 2017-05-14 DIAGNOSIS — Z7901 Long term (current) use of anticoagulants: Secondary | ICD-10-CM | POA: Diagnosis not present

## 2017-05-14 DIAGNOSIS — Y929 Unspecified place or not applicable: Secondary | ICD-10-CM | POA: Diagnosis not present

## 2017-05-14 DIAGNOSIS — J449 Chronic obstructive pulmonary disease, unspecified: Secondary | ICD-10-CM | POA: Insufficient documentation

## 2017-05-14 DIAGNOSIS — W19XXXA Unspecified fall, initial encounter: Secondary | ICD-10-CM | POA: Insufficient documentation

## 2017-05-14 DIAGNOSIS — I110 Hypertensive heart disease with heart failure: Secondary | ICD-10-CM | POA: Insufficient documentation

## 2017-05-14 DIAGNOSIS — M5489 Other dorsalgia: Secondary | ICD-10-CM | POA: Diagnosis not present

## 2017-05-14 DIAGNOSIS — M545 Low back pain: Secondary | ICD-10-CM | POA: Diagnosis not present

## 2017-05-14 MED ORDER — HYDROCODONE-ACETAMINOPHEN 7.5-325 MG/15ML PO SOLN
5.0000 mL | Freq: Once | ORAL | Status: AC
  Administered 2017-05-14: 5 mL via ORAL
  Filled 2017-05-14: qty 15

## 2017-05-14 MED ORDER — TRAMADOL HCL 50 MG PO TABS
25.0000 mg | ORAL_TABLET | Freq: Once | ORAL | Status: DC
  Filled 2017-05-14: qty 1

## 2017-05-14 MED ORDER — DICLOFENAC EPOLAMINE 1.3 % TD PTCH: 1.0000 | MEDICATED_PATCH | Freq: Two times a day (BID) | TRANSDERMAL | 1 refills | Status: AC

## 2017-05-14 MED ORDER — HYDROCODONE-ACETAMINOPHEN 7.5-325 MG/15ML PO SOLN: 5.0000 mL | Freq: Four times a day (QID) | ORAL | 0 refills | Status: DC | PRN

## 2017-05-14 MED ORDER — DICLOFENAC EPOLAMINE 1.3 % TD PTCH
1.0000 | MEDICATED_PATCH | Freq: Two times a day (BID) | TRANSDERMAL | Status: DC
  Administered 2017-05-14: 1 via TRANSDERMAL
  Filled 2017-05-14 (×2): qty 1

## 2017-05-14 NOTE — ED Provider Notes (Signed)
[facility]ERGENCY DEPARTMENT Provider Note   CSN: [id number] Arrival date & time: 05/14/17  1024     History   Chief Complaint Chief Complaint  Patient presents with  . Back Pain    HPI Christine Perez is a 84 y.o. female.  HPI Patient presents 11 days after being seen following a fall, now with concern for ongoing low back pain. She notes that she was seen at our facility to facility, after a minor fall onto her posterior. At that time she had pain in her ribs, lower back. She notes that her rib pain has resolved, but she has severe pain in her right lower back, which is persistent despite of using Aleve and Tylenol. No radiation of the pain, which is described as sharp, severe. No abdominal pain, no nausea, vomiting, fever, chills. Patient did not speak with her physician, now presents for evaluation.  Past Medical History:  Diagnosis Date  . Atrial fibrillation ([place])   . CARDIAC ARRHYTHMIA 01/08/2010  . COLONIC POLYPS, HX OF 10/09/2006  . COPD (chronic obstructive pulmonary disease) ([place])   . HYPERLIPIDEMIA 10/09/2006  . HYPERTENSION 10/09/2006  . HYPOTHYROIDISM 10/09/2006  . LASSITUDE 08/15/2009  . OSTEOPOROSIS 10/09/2006  . VERTIGO 08/07/2009  . Wears glasses   . Wears hearing aid    left    Patient Active Problem List   Diagnosis Date Noted  . Acute on chronic diastolic heart failure ([place]) 03/18/2016  . CHF exacerbation ([place]) 03/02/2016  . COPD with acute exacerbation ([place]) 03/02/2016  . Hypertensive urgency 03/02/2016  . Pleural effusion 03/02/2016  . SSS (sick sinus syndrome) ([place]) 03/02/2016  . CHF (congestive heart failure) ([place]) 03/02/2016  . Chronic anticoagulation 08/03/2013  . Atrial fibrillation ([place]) 08/02/2013  . Lobular carcinoma in situ of left breast 06/03/2012  . LASSITUDE 08/15/2009  . VERTIGO 08/07/2009  . Hypothyroidism 10/09/2006  . Dyslipidemia 10/09/2006  . Essential hypertension 10/09/2006  . Osteoporosis  10/09/2006  . History of colonic polyps 10/09/2006    Past Surgical History:  Procedure Laterality Date  . ABDOMINAL HYSTERECTOMY    . APPENDECTOMY    . BREAST SURGERY     left - lumpectomy  . CARDIOVERSION N/A 08/09/2013   Procedure: CARDIOVERSION;  Surgeon: Candee Furbish, MD;  Location: [facility] ENDOSCOPY;  Service: Cardiovascular;  Laterality: N/A;  . CATARACT EXTRACTION    . CHOLECYSTECTOMY    . PACEMAKER PLACEMENT N/A 12/05/2013  . PARTIAL MASTECTOMY WITH NEEDLE LOCALIZATION Left 06/29/2012   Procedure: PARTIAL MASTECTOMY WITH NEEDLE LOCALIZATION;  Surgeon: Adin Hector, MD;  Location: [facility];  Service: General;  Laterality: Left;  . TONSILLECTOMY    . YAG LASER APPLICATION      OB History    No data available       Home Medications    Prior to Admission medications   Medication Sig Start Date End Date Taking? Authorizing Provider  acetaminophen (TYLENOL) 500 MG tablet Take 500 mg by mouth every 6 (six) hours as needed for moderate pain or headache.    [provider]  amiodarone (PACERONE) 200 MG tablet Take 1.5 tablets (300 mg total) by mouth daily. 08/04/16   Troy Sine, MD  amiodarone (PACERONE) 200 MG tablet TAKE 1 TABLET BY MOUTH TWICE DAILY 03/09/17   Troy Sine, MD  calcium-vitamin D (OSCAL WITH D) 500-200 MG-UNIT per tablet Take 1 tablet by mouth 2 (two) times daily.     [provider]  diclofenac (VOLTAREN)  75 MG EC tablet Take 1 tablet (75 mg total) by mouth 2 (two) times daily. 05/06/17   Marletta Lor, MD  diclofenac sodium (VOLTAREN) 1 % GEL Apply 4 g topically 4 (four) times daily. 05/03/17   Blanchie Dessert, MD  furosemide (LASIX) 20 MG tablet Take 1 tablet (20 mg total) by mouth daily. 08/12/16   Marletta Lor, MD  ipratropium (ATROVENT HFA) 17 MCG/ACT inhaler Inhale 2 puffs into the lungs every 4 (four) hours as needed for wheezing. 03/18/16   Marletta Lor, MD  lidocaine (LIDODERM) 5 % Place 1  patch onto the skin daily. Remove & Discard patch within 12 hours or as directed by MD 05/03/17   Blanchie Dessert, MD  lidocaine (LIDODERM) 5 % Place 1 patch onto the skin daily. Remove & Discard patch within 12 hours or as directed by MD 05/11/17   Marletta Lor, MD  losartan (COZAAR) 100 MG tablet TAKE 1 TABLET BY MOUTH ONCE DAILY 03/16/17   Marletta Lor, MD  metoprolol succinate (TOPROL-XL) 50 MG 24 hr tablet TAKE 1 TABLET BY MOUTH TWICE DAILY 04/20/17   Troy Sine, MD  Multiple Vitamin (MULTIVITAMIN) tablet Take 1 tablet by mouth daily.    [provider]  spironolactone (ALDACTONE) 25 MG tablet Take 12.5 mg by mouth daily. 01/14/17   [provider]  spironolactone (ALDACTONE) 25 MG tablet TAKE 1/2 (ONE-HALF) TABLET BY MOUTH ONCE DAILY 04/06/17   Troy Sine, MD  SYNTHROID 75 MCG tablet TAKE 1 TABLET BY MOUTH ONCE DAILY 02/16/17   Marletta Lor, MD  traMADol (ULTRAM) 50 MG tablet Take 1 tablet (50 mg total) by mouth 2 (two) times daily as needed (pain). 03/26/17   Marletta Lor, MD  vitamin C (ASCORBIC ACID) 500 MG tablet Take 500 mg by mouth daily.    [provider]  XARELTO 15 MG TABS tablet TAKE 1 TABLET BY MOUTH ONCE DAILY WITH SUPPER 03/11/17   Marletta Lor, MD  zolpidem (AMBIEN) 5 MG tablet TAKE 1 TABLET BY MOUTH AT BEDTIME 05/13/17   Marletta Lor, MD    Family History Family History  Problem Relation Age of Onset  . Cancer Mother   . Heart failure Father   . Cancer Sister   . Stroke Sister     Social History Social History   Tobacco Use  . Smoking status: Never Smoker  . Smokeless tobacco: Never Used  Substance Use Topics  . Alcohol use: No  . Drug use: No     Allergies   Codeine   Review of Systems Review of Systems  Constitutional:       Per HPI, otherwise negative  HENT:       Per HPI, otherwise negative  Respiratory:       Per HPI, otherwise negative  Cardiovascular:       Per  HPI, otherwise negative  Gastrointestinal: Negative for vomiting.  Endocrine:       Negative aside from HPI  Genitourinary:       Neg aside from HPI   Musculoskeletal:       Per HPI, otherwise negative  Skin: Negative.   Neurological: Negative for syncope.     Physical Exam Updated Vital Signs BP (!) 105/56 (BP Location: Right Arm)   Pulse 71   Temp 98 F (36.7 C) (Oral)   Resp 18   SpO2 98%   Physical Exam  Constitutional: She is oriented to person, place, and  time. She appears well-developed and well-nourished. No distress.  HENT:  Head: Normocephalic and atraumatic.  Eyes: Conjunctivae and EOM are normal.  Cardiovascular: Normal rate and regular rhythm.  Pulmonary/Chest: Effort normal and breath sounds normal. No stridor. No respiratory distress.  Abdominal: She exhibits no distension.  Musculoskeletal: She exhibits no edema.       Arms: Pelvis is stable, patient flexes each hip independently, spontaneously, and against resistance. Musculoskeletal exam otherwise unremarkable.  Neurological: She is alert and oriented to person, place, and time. No cranial nerve deficit.  Skin: Skin is warm and dry.  Psychiatric: She has a normal mood and affect.  Nursing note and vitals reviewed.    ED Treatments / Results   Radiology Dg Lumbar Spine Complete  Result Date: 05/14/2017 CLINICAL DATA:  Pain following fall EXAM: LUMBAR SPINE - COMPLETE 4+ VIEW COMPARISON:  Chest radiograph Aug 01, 2016 FINDINGS: Frontal, lateral, spot lumbosacral lateral, and bilateral oblique views were obtained. The there are 5 non-rib-bearing lumbar type vertebral bodies. There is a transitional lumbosacral vertebra with assimilation joint on the left. There is marked anterior wedging of the L3 vertebral body. There is mild anterior wedging of the T12 vertebral body. No other fracture evident. No spondylolisthesis. There is moderate disc space narrowing at L2-3, L3-4, and L4-5. There is facet  osteoarthritic change at L4-5 and L5-S1 bilaterally. There is aortic atherosclerosis. IMPRESSION: Scoliosis. Marked anterior wedging of the L3 vertebral body. Milder anterior wedging of L2 vertebral body. These fractures are considered age uncertain. The T12 fracture was not evident on chest radiograph 9 months prior. No spondylolisthesis. Osteoarthritic change noted at several levels. Bones are osteoporotic. There is aortic atherosclerosis. Aortic Atherosclerosis (ICD10-I70.0). Electronically Signed   By: Lowella Grip III M.D.   On: 05/14/2017 14:24    Procedures Procedures (including critical care time)  Medications Ordered in ED Medications  traMADol (ULTRAM) tablet 25 mg (not administered)     Initial Impression / Assessment and Plan / ED Course  I have reviewed the triage vital signs and the nursing notes.  Pertinent labs & imaging results that were available during my care of the patient were reviewed by me and considered in my medical decision making (see chart for details).  After initial evaluation review mutation during her last visit, and x-rays performed of the ribs.    On repeat exam the patient is in similar condition, now accompanied by her daughter. Patient continues to complain of pain.  5:05 PM I discussed patient's case with the orthopedic technician, and the patient has had application of a TLSO brace. Patient tolerated this well, and after provision of additional medication, she has diminished pain. With no ongoing neurologic dysfunction, and with evidence for new vertebral body compression fracture, the patient had a treatment, and is now appropriate for discharge.  Much patient will follow up with orthopedics and primary care.  Final Clinical Impressions(s) / ED Diagnoses   Final diagnoses:  Closed compression fracture of fourth lumbar vertebra, initial encounter [facility])    ED Discharge Orders        Ordered    HYDROcodone-acetaminophen (HYCET) 7.5-325  mg/15 ml solution  Every 6 hours PRN     05/14/17 1657    diclofenac (FLECTOR) 1.3 % PTCH  2 times daily     05/14/17 1657       Carmin Muskrat, MD 05/14/17 1706

## 2017-05-14 NOTE — ED Notes (Signed)
Ortho Tech notified, will contact vendor for splint- will be here this afternoon.

## 2017-05-14 NOTE — Discharge Instructions (Signed)
As discussed, today's evaluation is demonstrated the likely compression fracture of your lumbar vertebrae. Please use medication as directed, monitor your condition carefully, and be sure to follow-up with both your physician and our orthopedic colleague. Return here for concerning changes in your condition.

## 2017-05-14 NOTE — ED Triage Notes (Signed)
EMS- pt coming from home. Pt had a fall 2 weeks ago. She has been seen at [facility] and has persistent back pain. Pt ambulatory with EMS. Vitals stable with EMS   130/82, HR 60, 98% ra

## 2017-05-14 NOTE — Progress Notes (Signed)
Orthopedic Tech Progress Note Patient Details:  JALON BLACKWELDER 1925-03-20 [id number]  Patient ID: [name], female   DOB: Jul 23, 1925, 84 y.o.   MRN: [id number]   [place] Pink 05/14/2017, 3:52 PMCalled [employer] for TLSO brace.

## 2017-05-14 NOTE — ED Notes (Signed)
Pt c/o low back pain since Feb 15th from a fall. Was seen at [facility], told she had no fx, has had continued pain, unrelieved with Alieve and Ibuprofren.

## 2017-05-26 ENCOUNTER — Ambulatory Visit (INDEPENDENT_AMBULATORY_CARE_PROVIDER_SITE_OTHER): Payer: Medicare Other | Admitting: *Deleted

## 2017-05-26 DIAGNOSIS — I495 Sick sinus syndrome: Secondary | ICD-10-CM

## 2017-05-27 ENCOUNTER — Encounter: Payer: Self-pay | Admitting: Cardiology

## 2017-05-27 NOTE — Progress Notes (Signed)
Remote pacemaker transmission.   

## 2017-05-28 ENCOUNTER — Ambulatory Visit (INDEPENDENT_AMBULATORY_CARE_PROVIDER_SITE_OTHER): Payer: Medicare Other | Admitting: Internal Medicine

## 2017-05-28 ENCOUNTER — Encounter: Payer: Self-pay | Admitting: Internal Medicine

## 2017-05-28 VITALS — BP 100/58 | HR 71 | Temp 98.0°F

## 2017-05-28 DIAGNOSIS — Z7901 Long term (current) use of anticoagulants: Secondary | ICD-10-CM

## 2017-05-28 DIAGNOSIS — R748 Abnormal levels of other serum enzymes: Secondary | ICD-10-CM

## 2017-05-28 DIAGNOSIS — S32040S Wedge compression fracture of fourth lumbar vertebra, sequela: Secondary | ICD-10-CM | POA: Diagnosis not present

## 2017-05-28 DIAGNOSIS — I5033 Acute on chronic diastolic (congestive) heart failure: Secondary | ICD-10-CM

## 2017-05-28 LAB — COMPREHENSIVE METABOLIC PANEL
ALBUMIN: 3.6 g/dL (ref 3.5–5.2)
ALK PHOS: 147 U/L — AB (ref 39–117)
ALT: 155 U/L — AB (ref 0–35)
AST: 115 U/L — AB (ref 0–37)
BUN: 39 mg/dL — AB (ref 6–23)
CO2: 27 mEq/L (ref 19–32)
CREATININE: 1.88 mg/dL — AB (ref 0.40–1.20)
Calcium: 10.8 mg/dL — ABNORMAL HIGH (ref 8.4–10.5)
Chloride: 103 mEq/L (ref 96–112)
GFR: 26.73 mL/min — ABNORMAL LOW (ref >60.00)
GLUCOSE: 133 mg/dL — AB (ref 70–99)
Potassium: 4.4 mEq/L (ref 3.5–5.1)
SODIUM: 140 meq/L (ref 135–145)
Total Bilirubin: 0.5 mg/dL (ref 0.2–1.2)
Total Protein: 6.4 g/dL (ref 6.0–8.3)

## 2017-05-28 LAB — CBC WITH DIFFERENTIAL/PLATELET
BASOS PCT: 0.5 % (ref 0.0–3.0)
Basophils Absolute: 0 10*3/uL (ref 0.0–0.1)
EOS ABS: 0 10*3/uL (ref 0.0–0.7)
Eosinophils Relative: 0.6 % (ref 0.0–5.0)
HCT: 35.9 % — ABNORMAL LOW (ref 36.0–46.0)
HEMOGLOBIN: 11.8 g/dL — AB (ref 12.0–15.0)
Lymphocytes Relative: 12 % (ref 12.0–46.0)
Lymphs Abs: 0.8 10*3/uL (ref 0.7–4.0)
MCHC: 32.8 g/dL (ref 30.0–36.0)
MCV: 101.5 fl — ABNORMAL HIGH (ref 78.0–100.0)
MONO ABS: 0.6 10*3/uL (ref 0.1–1.0)
Monocytes Relative: 9.3 % (ref 3.0–12.0)
Neutro Abs: 5.4 10*3/uL (ref 1.4–7.7)
Neutrophils Relative %: 77.6 % — ABNORMAL HIGH (ref 43.0–77.0)
Platelets: 151 10*3/uL (ref 150.0–400.0)
RBC: 3.54 Mil/uL — AB (ref 3.87–5.11)
RDW: 15.7 % — AB (ref 11.5–15.5)
WBC: 7 10*3/uL (ref 4.0–10.5)

## 2017-05-28 MED ORDER — HYDROCODONE-ACETAMINOPHEN 7.5-325 MG/15ML PO SOLN: 5.0000 mL | Freq: Four times a day (QID) | ORAL | 0 refills | Status: DC | PRN

## 2017-05-28 MED ORDER — TRAMADOL HCL 50 MG PO TABS: 50.0000 mg | ORAL_TABLET | Freq: Two times a day (BID) | ORAL | 0 refills | Status: AC | PRN

## 2017-05-28 MED ORDER — ONDANSETRON HCL 4 MG PO TABS: 4.0000 mg | ORAL_TABLET | Freq: Three times a day (TID) | ORAL | 0 refills | Status: DC | PRN

## 2017-05-28 MED ORDER — TRAMADOL HCL 50 MG PO TABS: 50.0000 mg | ORAL_TABLET | Freq: Two times a day (BID) | ORAL | 0 refills | Status: DC | PRN

## 2017-05-28 NOTE — Progress Notes (Signed)
Subjective:    Patient ID: Christine Perez, female    DOB: 09-09-1925, 84 y.o.   MRN: [id number]  HPI 84 year old patient who presents with a 4-week history of low back and right flank discomfort. The patient fell in her kitchen after turning and losing her balance.  She landed on hard flooring as well as some cabinets She was seen in the ED and radiographs to suggest compression fractures of L2-L3 as well as K44 of uncertain age.  She has been treated with both hydrocodone elixir as well as tramadol.  She continues to be quite uncomfortable.  Pain is aggravated by movement and alleviated by resting in bed in a supine position She has had some nausea with the medication;  she has had a very difficult time ambulating due to the discomfort but   Past Medical History:  Diagnosis Date  . Atrial fibrillation ([place])   . CARDIAC ARRHYTHMIA 01/08/2010  . COLONIC POLYPS, HX OF 10/09/2006  . COPD (chronic obstructive pulmonary disease) ([place])   . HYPERLIPIDEMIA 10/09/2006  . HYPERTENSION 10/09/2006  . HYPOTHYROIDISM 10/09/2006  . LASSITUDE 08/15/2009  . OSTEOPOROSIS 10/09/2006  . VERTIGO 08/07/2009  . Wears glasses   . Wears hearing aid    left     Social History   Socioeconomic History  . Marital status: Widowed    Spouse name: Not on file  . Number of children: Not on file  . Years of education: Not on file  . Highest education level: Not on file  Social Needs  . Financial resource strain: Not on file  . Food insecurity - worry: Not on file  . Food insecurity - inability: Not on file  . Transportation needs - medical: Not on file  . Transportation needs - non-medical: Not on file  Occupational History  . Not on file  Tobacco Use  . Smoking status: Never Smoker  . Smokeless tobacco: Never Used  Substance and Sexual Activity  . Alcohol use: No  . Drug use: No  . Sexual activity: Not on file  Other Topics Concern  . Not on file  Social History Narrative  . Not on file    Past  Surgical History:  Procedure Laterality Date  . ABDOMINAL HYSTERECTOMY    . APPENDECTOMY    . BREAST SURGERY     left - lumpectomy  . CARDIOVERSION N/A 08/09/2013   Procedure: CARDIOVERSION;  Surgeon: Candee Furbish, MD;  Location: [facility] ENDOSCOPY;  Service: Cardiovascular;  Laterality: N/A;  . CATARACT EXTRACTION    . CHOLECYSTECTOMY    . PACEMAKER PLACEMENT N/A 12/05/2013  . PARTIAL MASTECTOMY WITH NEEDLE LOCALIZATION Left 06/29/2012   Procedure: PARTIAL MASTECTOMY WITH NEEDLE LOCALIZATION;  Surgeon: Adin Hector, MD;  Location: [facility];  Service: General;  Laterality: Left;  . TONSILLECTOMY    . YAG LASER APPLICATION      Family History  Problem Relation Age of Onset  . Cancer Mother   . Heart failure Father   . Cancer Sister   . Stroke Sister     Allergies  Allergen Reactions  . Codeine Shortness Of Breath    Difficulty breathing  . Tramadol Other (See Comments)    Hallucinations and "my feet jerk uncontrollably"     Current Outpatient Medications on File Prior to Visit  Medication Sig Dispense Refill  . acetaminophen (TYLENOL) 500 MG tablet Take 500 mg by mouth every 6 (six) hours as needed for moderate pain or headache.    Marland Kitchen  amiodarone (PACERONE) 200 MG tablet Take 1.5 tablets (300 mg total) by mouth daily. 45 tablet 6  . amiodarone (PACERONE) 200 MG tablet TAKE 1 TABLET BY MOUTH TWICE DAILY 60 tablet 6  . calcium-vitamin D (OSCAL WITH D) 500-200 MG-UNIT per tablet Take 1 tablet by mouth 2 (two) times daily.     . diclofenac (FLECTOR) 1.3 % PTCH Place 1 patch onto the skin 2 (two) times daily. 5 patch 1  . diclofenac sodium (VOLTAREN) 1 % GEL Apply 4 g topically 4 (four) times daily. 100 g 2  . furosemide (LASIX) 20 MG tablet Take 1 tablet (20 mg total) by mouth daily. 90 tablet 3  . ipratropium (ATROVENT HFA) 17 MCG/ACT inhaler Inhale 2 puffs into the lungs every 4 (four) hours as needed for wheezing. 1 Inhaler 12  . lidocaine (LIDODERM) 5 % Place 1  patch onto the skin daily. Remove & Discard patch within 12 hours or as directed by MD 30 patch 1  . lidocaine (LIDODERM) 5 % Place 1 patch onto the skin daily. Remove & Discard patch within 12 hours or as directed by MD 30 patch 0  . losartan (COZAAR) 100 MG tablet TAKE 1 TABLET BY MOUTH ONCE DAILY 90 tablet 1  . metoprolol succinate (TOPROL-XL) 50 MG 24 hr tablet TAKE 1 TABLET BY MOUTH TWICE DAILY 60 tablet 6  . Multiple Vitamin (MULTIVITAMIN) tablet Take 1 tablet by mouth daily.    Marland Kitchen spironolactone (ALDACTONE) 25 MG tablet Take 12.5 mg by mouth daily.  3  . SYNTHROID 75 MCG tablet TAKE 1 TABLET BY MOUTH ONCE DAILY 90 tablet 1  . vitamin C (ASCORBIC ACID) 500 MG tablet Take 500 mg by mouth daily.    Alveda Reasons 15 MG TABS tablet TAKE 1 TABLET BY MOUTH ONCE DAILY WITH SUPPER 90 tablet 1  . zolpidem (AMBIEN) 5 MG tablet TAKE 1 TABLET BY MOUTH AT BEDTIME 30 tablet 5   Current Facility-Administered Medications on File Prior to Visit  Medication Dose Route Frequency Provider Last Rate Last Dose  . Bevacizumab (AVASTIN) SOLN 1.25 mg  1.25 mg Intravitreal  Bernarda Caffey, MD   1.25 mg at 03/03/17 1733  . Bevacizumab (AVASTIN) SOLN 1.25 mg  1.25 mg Intravitreal  Bernarda Caffey, MD   1.25 mg at 04/22/17 1625    BP (!) 100/58 (BP Location: Right Arm, Patient Position: Sitting, Cuff Size: Normal)   Pulse 71   Temp 98 F (36.7 C) (Oral)   SpO2 99%    Review of Systems  Constitutional: Negative.   HENT: Negative for congestion, dental problem, hearing loss, rhinorrhea, sinus pressure, sore throat and tinnitus.   Eyes: Negative for pain, discharge and visual disturbance.  Respiratory: Negative for cough and shortness of breath.   Cardiovascular: Negative for chest pain, palpitations and leg swelling.  Gastrointestinal: Positive for nausea. Negative for abdominal distention, abdominal pain, blood in stool, constipation, diarrhea and vomiting.  Genitourinary: Positive for flank pain. Negative for  difficulty urinating, dysuria, frequency, hematuria, pelvic pain, urgency, vaginal bleeding, vaginal discharge and vaginal pain.  Musculoskeletal: Positive for back pain. Negative for arthralgias, gait problem and joint swelling.  Skin: Negative for rash.  Neurological: Negative for dizziness, syncope, speech difficulty, weakness, numbness and headaches.  Hematological: Negative for adenopathy.  Psychiatric/Behavioral: Negative for agitation, behavioral problems and dysphoric mood. The patient is not nervous/anxious.        Objective:   Physical Exam  Constitutional: She is oriented to person, place, and time. She  appears well-developed and well-nourished. No distress.  Wheelchair-bound Vital signs stable  HENT:  Head: Normocephalic.  Right Ear: External ear normal.  Left Ear: External ear normal.  Mouth/Throat: Oropharynx is clear and moist.  Eyes: Conjunctivae and EOM are normal. Pupils are equal, round, and reactive to light.  Neck: Normal range of motion. Neck supple. No thyromegaly present.  Cardiovascular: Normal rate, regular rhythm, normal heart sounds and intact distal pulses.  Pulmonary/Chest: Effort normal and breath sounds normal.  Tenderness over the right lower  chest wall area no ecchymosis  No tenderness over the lower thoracic or lumbar spine  Abdominal: Soft. Bowel sounds are normal. She exhibits no mass. There is no tenderness.  Musculoskeletal: Normal range of motion.  No tenderness over the lower thoracic or lumbar spine Tenderness noted over the right posterior lateral chest wall area No ecchymoses noted Kyphosis present  Lymphadenopathy:    She has no cervical adenopathy.  Neurological: She is alert and oriented to person, place, and time.  Skin: Skin is warm and dry. No rash noted.  Psychiatric: She has a normal mood and affect. Her behavior is normal.          Assessment & Plan:  Probable traumatic lumbar compression fractures.  Patient may have  also sustained a fracture to right rib.  Continue symptomatic care will treat nausea.  Continue analgesics.  Check updated lab  [facility]

## 2017-05-28 NOTE — Patient Instructions (Addendum)
Vertebral Fracture A vertebral fracture means that one of the bones in the spine is broken (fractured). These bones are called vertebrae. You may have back pain that gets worse when you move. Vertebral fractures can be mild or severe. Many will get better without surgery. Surgery may be needed for more severe breaks. Follow these instructions at home: General instructions  Take medicines only as told by your doctor.  Do not drive or use heavy machinery while taking pain medicine.  If told, put ice on the injured area: ? Put ice in a plastic bag. ? Place a towel between your skin and the bag. ? Leave the ice on for 30 minutes every 2 hours at first. Then use the ice as needed.  Wear your neck brace or back brace as told by your doctor.  Do not drink alcohol.  Keep all follow-up visits as told by your doctor. This is important. Activity  Stay in bed (on bed rest) only as told by your doctor. Being on bed rest for too long can make your condition worse.  Return to your normal activities as told by your doctor. Ask your doctor what is safe for you to do.  Do exercises for your back (physical therapy) as told by your doctor.  Exercise often as told by your doctor. Contact a doctor if:  You have a fever.  You have a cough that makes your pain worse.  Your pain medicine is not helping.  Your pain does not get better over time.  You cannot return to your normal activities as planned. Get help right away if:  Your pain is very bad and it suddenly gets worse.  You are not able to move any body part (paralysis) that is below the level of your injury.  You have numbness, tingling, or weakness in any body part that is below the level of your injury.  You cannot control when you pee (urinate) or when you poop (have bowel movements). This information is not intended to replace advice given to you by your health care provider. Make sure you discuss any questions you have with your  health care provider. Document Released: 08/21/2009 Document Revised: 08/09/2015 Document Reviewed: 03/08/2014 Elsevier Interactive Patient Education  2018 Elsevier Inc.  

## 2017-05-29 ENCOUNTER — Ambulatory Visit (HOSPITAL_COMMUNITY)
Admission: RE | Admit: 2017-05-29 | Discharge: 2017-05-29 | Disposition: A | Discharge status: Home / Self Care | Payer: Medicare Other | Source: Ambulatory Visit | Attending: Internal Medicine | Admitting: Internal Medicine

## 2017-05-29 DIAGNOSIS — R748 Abnormal levels of other serum enzymes: Secondary | ICD-10-CM | POA: Diagnosis not present

## 2017-05-29 DIAGNOSIS — R188 Other ascites: Secondary | ICD-10-CM | POA: Insufficient documentation

## 2017-05-29 DIAGNOSIS — R945 Abnormal results of liver function studies: Secondary | ICD-10-CM | POA: Diagnosis not present

## 2017-05-29 DIAGNOSIS — Z9049 Acquired absence of other specified parts of digestive tract: Secondary | ICD-10-CM | POA: Diagnosis not present

## 2017-05-29 NOTE — Addendum Note (Signed)
Addended by: Gwenyth Ober R on: 05/29/2017 10:08 AM   Modules accepted: Orders

## 2017-05-29 NOTE — Addendum Note (Signed)
Addended by: Gwenyth Ober R on: 05/29/2017 10:19 AM   Modules accepted: Orders

## 2017-05-29 NOTE — Addendum Note (Signed)
Addended by: Gwenyth Ober R on: 05/29/2017 10:42 AM   Modules accepted: Orders

## 2017-06-03 ENCOUNTER — Ambulatory Visit: Payer: Medicare Other | Admitting: Internal Medicine

## 2017-06-03 DIAGNOSIS — Z0289 Encounter for other administrative examinations: Secondary | ICD-10-CM

## 2017-06-03 LAB — CUP PACEART REMOTE DEVICE CHECK
Battery Remaining Longevity: 23 mo
Battery Voltage: 2.96 V
Brady Statistic AP VP Percent: 0.94 %
Brady Statistic AP VS Percent: 98.97 %
Brady Statistic AS VS Percent: 0.09 %
Brady Statistic RV Percent Paced: 0.95 %
Implantable Pulse Generator Implant Date: 20150921
Lead Channel Impedance Value: 399 Ohm
Lead Channel Impedance Value: 418 Ohm
Lead Channel Impedance Value: 494 Ohm
Lead Channel Pacing Threshold Amplitude: 0.75 V
Lead Channel Pacing Threshold Amplitude: 1 V
Lead Channel Sensing Intrinsic Amplitude: 1.75 mV
Lead Channel Sensing Intrinsic Amplitude: 1.75 mV
Lead Channel Setting Pacing Amplitude: 5 V
Lead Channel Setting Sensing Sensitivity: 0.9 mV
MDC IDC LEAD LOCATION: 753859
MDC IDC LEAD LOCATION: 753860
MDC IDC MSMT LEADCHNL RA IMPEDANCE VALUE: 361 Ohm
MDC IDC MSMT LEADCHNL RA PACING THRESHOLD PULSEWIDTH: 0.4 ms
MDC IDC MSMT LEADCHNL RV PACING THRESHOLD PULSEWIDTH: 0.4 ms
MDC IDC MSMT LEADCHNL RV SENSING INTR AMPL: 19.75 mV
MDC IDC MSMT LEADCHNL RV SENSING INTR AMPL: 19.75 mV
MDC IDC SESS DTM: 20190312202721
MDC IDC SET LEADCHNL RV PACING AMPLITUDE: 2 V
MDC IDC SET LEADCHNL RV PACING PULSEWIDTH: 0.4 ms
MDC IDC STAT BRADY AS VP PERCENT: 0 %
MDC IDC STAT BRADY RA PERCENT PACED: 99.9 %

## 2017-06-10 ENCOUNTER — Emergency Department (HOSPITAL_COMMUNITY): Payer: Medicare Other

## 2017-06-10 ENCOUNTER — Encounter (HOSPITAL_COMMUNITY): Payer: Self-pay | Admitting: Emergency Medicine

## 2017-06-10 ENCOUNTER — Telehealth: Payer: Self-pay | Admitting: *Deleted

## 2017-06-10 ENCOUNTER — Emergency Department (HOSPITAL_COMMUNITY)
Admission: EM | Admit: 2017-06-10 | Discharge: 2017-06-10 | Disposition: A | Discharge status: Home / Self Care | Payer: Medicare Other | Attending: Emergency Medicine | Admitting: Emergency Medicine

## 2017-06-10 DIAGNOSIS — M545 Low back pain, unspecified: Secondary | ICD-10-CM

## 2017-06-10 DIAGNOSIS — J449 Chronic obstructive pulmonary disease, unspecified: Secondary | ICD-10-CM | POA: Insufficient documentation

## 2017-06-10 DIAGNOSIS — E785 Hyperlipidemia, unspecified: Secondary | ICD-10-CM | POA: Diagnosis not present

## 2017-06-10 DIAGNOSIS — I5032 Chronic diastolic (congestive) heart failure: Secondary | ICD-10-CM | POA: Diagnosis not present

## 2017-06-10 DIAGNOSIS — Z79899 Other long term (current) drug therapy: Secondary | ICD-10-CM | POA: Insufficient documentation

## 2017-06-10 DIAGNOSIS — Z7401 Bed confinement status: Secondary | ICD-10-CM

## 2017-06-10 DIAGNOSIS — R531 Weakness: Secondary | ICD-10-CM | POA: Diagnosis not present

## 2017-06-10 DIAGNOSIS — I6789 Other cerebrovascular disease: Secondary | ICD-10-CM | POA: Diagnosis not present

## 2017-06-10 DIAGNOSIS — R627 Adult failure to thrive: Secondary | ICD-10-CM | POA: Insufficient documentation

## 2017-06-10 DIAGNOSIS — Z7901 Long term (current) use of anticoagulants: Secondary | ICD-10-CM | POA: Insufficient documentation

## 2017-06-10 DIAGNOSIS — R6251 Failure to thrive (child): Secondary | ICD-10-CM

## 2017-06-10 DIAGNOSIS — E039 Hypothyroidism, unspecified: Secondary | ICD-10-CM | POA: Insufficient documentation

## 2017-06-10 DIAGNOSIS — I110 Hypertensive heart disease with heart failure: Secondary | ICD-10-CM | POA: Diagnosis not present

## 2017-06-10 DIAGNOSIS — Z950 Presence of cardiac pacemaker: Secondary | ICD-10-CM | POA: Insufficient documentation

## 2017-06-10 DIAGNOSIS — R404 Transient alteration of awareness: Secondary | ICD-10-CM | POA: Diagnosis not present

## 2017-06-10 DIAGNOSIS — I4891 Unspecified atrial fibrillation: Secondary | ICD-10-CM | POA: Diagnosis not present

## 2017-06-10 DIAGNOSIS — J90 Pleural effusion, not elsewhere classified: Secondary | ICD-10-CM | POA: Diagnosis not present

## 2017-06-10 LAB — URINALYSIS, ROUTINE W REFLEX MICROSCOPIC
Bilirubin Urine: NEGATIVE
GLUCOSE, UA: NEGATIVE mg/dL
HGB URINE DIPSTICK: NEGATIVE
KETONES UR: NEGATIVE mg/dL
NITRITE: NEGATIVE
PROTEIN: NEGATIVE mg/dL
Specific Gravity, Urine: 1.013 (ref 1.005–1.030)
pH: 5 (ref 5.0–8.0)

## 2017-06-10 LAB — CBC WITH DIFFERENTIAL/PLATELET
BASOS ABS: 0 10*3/uL (ref 0.0–0.1)
BASOS PCT: 0 %
Eosinophils Absolute: 0 10*3/uL (ref 0.0–0.7)
Eosinophils Relative: 1 %
HEMATOCRIT: 40.2 % (ref 36.0–46.0)
HEMOGLOBIN: 12.4 g/dL (ref 12.0–15.0)
Lymphocytes Relative: 19 %
Lymphs Abs: 1.4 10*3/uL (ref 0.7–4.0)
MCH: 32.6 pg (ref 26.0–34.0)
MCHC: 30.8 g/dL (ref 30.0–36.0)
MCV: 105.8 fL — ABNORMAL HIGH (ref 78.0–100.0)
MONOS PCT: 10 %
Monocytes Absolute: 0.8 10*3/uL (ref 0.1–1.0)
NEUTROS PCT: 70 %
Neutro Abs: 5.2 10*3/uL (ref 1.7–7.7)
Platelets: 133 10*3/uL — ABNORMAL LOW (ref 150–400)
RBC: 3.8 MIL/uL — AB (ref 3.87–5.11)
RDW: 16 % — ABNORMAL HIGH (ref 11.5–15.5)
WBC: 7.4 10*3/uL (ref 4.0–10.5)

## 2017-06-10 LAB — BASIC METABOLIC PANEL
ANION GAP: 13 (ref 5–15)
BUN: 28 mg/dL — ABNORMAL HIGH (ref 6–20)
CALCIUM: 10.6 mg/dL — AB (ref 8.9–10.3)
CO2: 26 mmol/L (ref 22–32)
Chloride: 106 mmol/L (ref 101–111)
Creatinine, Ser: 1.76 mg/dL — ABNORMAL HIGH (ref 0.44–1.00)
GFR, EST AFRICAN AMERICAN: 28 mL/min — AB (ref >60)
GFR, EST NON AFRICAN AMERICAN: 24 mL/min — AB (ref >60)
Glucose, Bld: 157 mg/dL — ABNORMAL HIGH (ref 65–99)
Potassium: 4.4 mmol/L (ref 3.5–5.1)
Sodium: 145 mmol/L (ref 135–145)

## 2017-06-10 MED ORDER — FENTANYL CITRATE (PF) 100 MCG/2ML IJ SOLN
50.0000 ug | Freq: Once | INTRAMUSCULAR | Status: AC
  Administered 2017-06-10: 50 ug via INTRAVENOUS
  Filled 2017-06-10: qty 2

## 2017-06-10 MED ORDER — METOCLOPRAMIDE HCL 5 MG/ML IJ SOLN
10.0000 mg | Freq: Once | INTRAMUSCULAR | Status: AC
  Administered 2017-06-10: 10 mg via INTRAVENOUS
  Filled 2017-06-10: qty 2

## 2017-06-10 MED ORDER — OXYCODONE-ACETAMINOPHEN 5-325 MG PO TABS
1.0000 | ORAL_TABLET | Freq: Once | ORAL | Status: AC
  Administered 2017-06-10: 1 via ORAL
  Filled 2017-06-10: qty 1

## 2017-06-10 MED ORDER — ONDANSETRON HCL 4 MG PO TABS: 4.0000 mg | ORAL_TABLET | Freq: Three times a day (TID) | ORAL | 0 refills | Status: DC | PRN

## 2017-06-10 MED ORDER — SODIUM CHLORIDE 0.9 % IV BOLUS
500.0000 mL | Freq: Once | INTRAVENOUS | Status: AC
  Administered 2017-06-10: 500 mL via INTRAVENOUS

## 2017-06-10 NOTE — Telephone Encounter (Signed)
Copied from [place] [id number]. Topic: Referral - Request >> Jun 09, 2017  4:59 PM Oliver Pila B wrote: Reason for CRM: pt's child called to request a referral for home health care, call pt to advise

## 2017-06-10 NOTE — Telephone Encounter (Signed)
Spoke to patient and she stated that [facility] will be okay she will just need to know what gets approved and what will be done before she says yes. Patient also states that if referred she will like a female aide. Told patient I will call her as soon as Dr.kwiatkowski get to view everything. Patient verbalized understanding.

## 2017-06-10 NOTE — ED Triage Notes (Signed)
Per EMS pt coming from home c/o generalized weakness onset of last night. C/o nausea and vomiting.

## 2017-06-10 NOTE — ED Provider Notes (Signed)
[facility] DEPT Provider Note   CSN: [id number] Arrival date & time: 06/10/17  1930     History   Chief Complaint Chief Complaint  Patient presents with  . Emesis  . Weakness    HPI Christine Perez is a 84 y.o. female.  Patient is an 84 year old female with a history of atrial fibrillation, COPD, hypertension and hyperlipidemia who presents with back pain and weakness.  She had a fall in mid February and since that time is been complaining of pain in her right lower back.  She had x-rays done on February 28 which showed L3, L2 and T12 vertebral body compression fractures.  She had a TLS O brace given to her.  She states she has had ongoing pain in that area.  She is been using Advil and Tylenol for symptomatic relief but she states is not helping her.  She has tried tramadol but states that the tramadol makes her have hallucinations and leg tremors.  She states she has has not felt well since the fall.  She had ongoing pain and some associated nausea with periodic vomiting.  She states she does not have a good appetite.  She had some generalized weakness which is been worsening over the last 3 weeks.  No fevers.  She has a little bit of a cough.  No chest pain or shortness of breath.  No palpitations.     Past Medical History:  Diagnosis Date  . Atrial fibrillation ([place])   . CARDIAC ARRHYTHMIA 01/08/2010  . COLONIC POLYPS, HX OF 10/09/2006  . COPD (chronic obstructive pulmonary disease) ([place])   . HYPERLIPIDEMIA 10/09/2006  . HYPERTENSION 10/09/2006  . HYPOTHYROIDISM 10/09/2006  . LASSITUDE 08/15/2009  . OSTEOPOROSIS 10/09/2006  . VERTIGO 08/07/2009  . Wears glasses   . Wears hearing aid    left    Patient Active Problem List   Diagnosis Date Noted  . Acute on chronic diastolic heart failure ([place]) 03/18/2016  . CHF exacerbation ([place]) 03/02/2016  . COPD with acute exacerbation ([place]) 03/02/2016  . Hypertensive urgency 03/02/2016  . Pleural effusion  03/02/2016  . SSS (sick sinus syndrome) ([place]) 03/02/2016  . CHF (congestive heart failure) ([place]) 03/02/2016  . Chronic anticoagulation 08/03/2013  . Atrial fibrillation ([place]) 08/02/2013  . Lobular carcinoma in situ of left breast 06/03/2012  . LASSITUDE 08/15/2009  . VERTIGO 08/07/2009  . Hypothyroidism 10/09/2006  . Dyslipidemia 10/09/2006  . Essential hypertension 10/09/2006  . Osteoporosis 10/09/2006  . History of colonic polyps 10/09/2006    Past Surgical History:  Procedure Laterality Date  . ABDOMINAL HYSTERECTOMY    . APPENDECTOMY    . BREAST SURGERY     left - lumpectomy  . CARDIOVERSION N/A 08/09/2013   Procedure: CARDIOVERSION;  Surgeon: Candee Furbish, MD;  Location: [facility] ENDOSCOPY;  Service: Cardiovascular;  Laterality: N/A;  . CATARACT EXTRACTION    . CHOLECYSTECTOMY    . PACEMAKER PLACEMENT N/A 12/05/2013  . PARTIAL MASTECTOMY WITH NEEDLE LOCALIZATION Left 06/29/2012   Procedure: PARTIAL MASTECTOMY WITH NEEDLE LOCALIZATION;  Surgeon: Adin Hector, MD;  Location: [facility];  Service: General;  Laterality: Left;  . TONSILLECTOMY    . YAG LASER APPLICATION       OB History   None      Home Medications    Prior to Admission medications   Medication Sig Start Date End Date Taking? Authorizing Provider  acetaminophen (TYLENOL) 500 MG tablet Take 500 mg by mouth every 6 (  six) hours as needed for moderate pain or headache.   Yes [provider]  amiodarone (PACERONE) 200 MG tablet TAKE 1 TABLET BY MOUTH TWICE DAILY 03/09/17  Yes Troy Sine, MD  calcium-vitamin D (OSCAL WITH D) 500-200 MG-UNIT per tablet Take 1 tablet by mouth 2 (two) times daily.    Yes [provider]  furosemide (LASIX) 20 MG tablet Take 1 tablet (20 mg total) by mouth daily. 08/12/16  Yes Marletta Lor, MD  HYDROcodone-acetaminophen (HYCET) 7.5-325 mg/15 ml solution Take 5 mLs by mouth every 6 (six) hours as needed for moderate pain or severe pain.  05/28/17  Yes Marletta Lor, MD  losartan (COZAAR) 100 MG tablet TAKE 1 TABLET BY MOUTH ONCE DAILY 03/16/17  Yes Marletta Lor, MD  metoprolol succinate (TOPROL-XL) 50 MG 24 hr tablet TAKE 1 TABLET BY MOUTH TWICE DAILY 04/20/17  Yes Troy Sine, MD  Multiple Vitamin (MULTIVITAMIN) tablet Take 1 tablet by mouth daily.   Yes [provider]  spironolactone (ALDACTONE) 25 MG tablet Take 12.5 mg by mouth daily. 01/14/17  Yes [provider]  SYNTHROID 75 MCG tablet TAKE 1 TABLET BY MOUTH ONCE DAILY 02/16/17  Yes Marletta Lor, MD  vitamin C (ASCORBIC ACID) 500 MG tablet Take 500 mg by mouth daily.   Yes [provider]  vitamin E 100 UNIT capsule Take 100 Units by mouth daily.   Yes [provider]  XARELTO 15 MG TABS tablet TAKE 1 TABLET BY MOUTH ONCE DAILY WITH SUPPER 03/11/17  Yes Marletta Lor, MD  amiodarone (PACERONE) 200 MG tablet Take 1.5 tablets (300 mg total) by mouth daily. Patient not taking: Reported on 06/10/2017 08/04/16   Troy Sine, MD  diclofenac (FLECTOR) 1.3 % PTCH Place 1 patch onto the skin 2 (two) times daily. Patient not taking: Reported on 06/10/2017 05/14/17   Carmin Muskrat, MD  diclofenac sodium (VOLTAREN) 1 % GEL Apply 4 g topically 4 (four) times daily. Patient not taking: Reported on 06/10/2017 05/03/17   Blanchie Dessert, MD  ipratropium (ATROVENT HFA) 17 MCG/ACT inhaler Inhale 2 puffs into the lungs every 4 (four) hours as needed for wheezing. 03/18/16   Marletta Lor, MD  lidocaine (LIDODERM) 5 % Place 1 patch onto the skin daily. Remove & Discard patch within 12 hours or as directed by MD Patient not taking: Reported on 06/10/2017 05/03/17   Blanchie Dessert, MD  lidocaine (LIDODERM) 5 % Place 1 patch onto the skin daily. Remove & Discard patch within 12 hours or as directed by MD Patient not taking: Reported on 06/10/2017 05/11/17   Marletta Lor, MD  ondansetron (ZOFRAN) 4 MG tablet Take 1  tablet (4 mg total) by mouth every 8 (eight) hours as needed for nausea or vomiting. 06/10/17   Malvin Johns, MD  traMADol (ULTRAM) 50 MG tablet Take 1 tablet (50 mg total) by mouth 2 (two) times daily as needed (pain). Patient not taking: Reported on 06/10/2017 05/28/17   Marletta Lor, MD  zolpidem (AMBIEN) 5 MG tablet TAKE 1 TABLET BY MOUTH AT BEDTIME Patient not taking: Reported on 06/10/2017 05/13/17   Marletta Lor, MD    Family History Family History  Problem Relation Age of Onset  . Cancer Mother   . Heart failure Father   . Cancer Sister   . Stroke Sister     Social History Social History   Tobacco Use  . Smoking status: Never Smoker  .  Smokeless tobacco: Never Used  Substance Use Topics  . Alcohol use: No  . Drug use: No     Allergies   Codeine and Tramadol   Review of Systems Review of Systems  Constitutional: Positive for fatigue. Negative for chills, diaphoresis and fever.  HENT: Negative for congestion, rhinorrhea and sneezing.   Eyes: Negative.   Respiratory: Negative for cough, chest tightness and shortness of breath.   Cardiovascular: Negative for chest pain and leg swelling.  Gastrointestinal: Positive for nausea and vomiting. Negative for abdominal pain, blood in stool and diarrhea.  Genitourinary: Negative for difficulty urinating, flank pain, frequency and hematuria.  Musculoskeletal: Positive for back pain. Negative for arthralgias.  Skin: Negative for rash.  Neurological: Positive for weakness (generalized weakness). Negative for dizziness, speech difficulty, numbness and headaches.     Physical Exam Updated Vital Signs BP 95/63 (BP Location: Left Arm)   Pulse 60   Temp 97.7 F (36.5 C) (Oral)   Resp 18   SpO2 94%   Physical Exam  Constitutional: She is oriented to person, place, and time. She appears well-developed and well-nourished.  HENT:  Head: Normocephalic and atraumatic.  Eyes: Pupils are equal, round, and reactive  to light.  Neck: Normal range of motion. Neck supple.  Cardiovascular: Normal rate, regular rhythm and normal heart sounds.  Pulmonary/Chest: Effort normal and breath sounds normal. No respiratory distress. She has no wheezes. She has no rales. She exhibits no tenderness.  Abdominal: Soft. Bowel sounds are normal. There is no tenderness. There is no rebound and no guarding.  Musculoskeletal: Normal range of motion. She exhibits no edema.  Positive tenderness in the right lower back.  The pain seems more in the musculature rather than along the spine.  There is no rash or color change.  Lymphadenopathy:    She has no cervical adenopathy.  Neurological: She is alert and oriented to person, place, and time.  Normal motor function and sensation to the extremities  Skin: Skin is warm and dry. No rash noted.  Psychiatric: She has a normal mood and affect.     ED Treatments / Results  Labs (all labs ordered are listed, but only abnormal results are displayed) Labs Reviewed  BASIC METABOLIC PANEL - Abnormal; Notable for the following components:      Result Value   Glucose, Bld 157 (*)    BUN 28 (*)    Creatinine, Ser 1.76 (*)    Calcium 10.6 (*)    GFR calc non Af Amer 24 (*)    GFR calc Af Amer 28 (*)    All other components within normal limits  CBC WITH DIFFERENTIAL/PLATELET - Abnormal; Notable for the following components:   RBC 3.80 (*)    MCV 105.8 (*)    RDW 16.0 (*)    Platelets 133 (*)    All other components within normal limits  URINALYSIS, ROUTINE W REFLEX MICROSCOPIC - Abnormal; Notable for the following components:   APPearance HAZY (*)    Leukocytes, UA TRACE (*)    Bacteria, UA FEW (*)    Squamous Epithelial / LPF 0-5 (*)    All other components within normal limits    EKG None  Radiology Dg Chest 2 View  Result Date: 06/10/2017 CLINICAL DATA:  Weakness EXAM: CHEST - 2 VIEW COMPARISON:  05/03/2017, 08/01/2016 FINDINGS: Trace pleural effusion. No focal  consolidation. Mild cardiomegaly with aortic atherosclerosis. Left-sided pacing device. Kyphosis of the spine with marked compression fracture of lower thoracic vertebra  and upper lumbar vertebra. IMPRESSION: 1. Mild cardiomegaly with trace pleural effusion or thickening. No focal opacity 2. Marked compression fracture deformities at the lower thoracic and upper lumbar spine Electronically Signed   By: Donavan Foil M.D.   On: 06/10/2017 21:53   Ct Head Wo Contrast  Result Date: 06/10/2017 CLINICAL DATA:  Generalized weakness beginning last night, nausea and vomiting. History of atrial fibrillation, hypertension and hyperlipidemia. EXAM: CT HEAD WITHOUT CONTRAST TECHNIQUE: Contiguous axial images were obtained from the base of the skull through the vertex without intravenous contrast. COMPARISON:  CT HEAD May 03, 2017 FINDINGS: BRAIN: No intraparenchymal hemorrhage, mass effect nor midline shift. Old mesial RIGHT occipital lobe encephalomalacia. Old bilateral basal ganglia lacunar infarcts. Patchy to confluent supratentorial white matter hypodensities. No abnormal extra-axial fluid collections. Basal cisterns are patent. VASCULAR: Mild calcific atherosclerosis of the carotid siphons. SKULL: No skull fracture. No significant scalp soft tissue swelling. SINUSES/ORBITS: The mastoid air-cells and included paranasal sinuses are well-aerated.The included ocular globes and orbital contents are non-suspicious. Status post bilateral ocular lens implants. OTHER: None. IMPRESSION: 1. No acute intracranial process. 2. Stable examination including moderate chronic small vessel ischemic disease and old RIGHT occipital lobe/PCA territory infarct. Electronically Signed   By: Elon Alas M.D.   On: 06/10/2017 22:06    Procedures Procedures (including critical care time)  Medications Ordered in ED Medications  fentaNYL (SUBLIMAZE) injection 50 mcg (has no administration in time range)  sodium chloride 0.9 %  bolus 500 mL (0 mLs Intravenous Stopped 06/10/17 2203)  oxyCODONE-acetaminophen (PERCOCET/ROXICET) 5-325 MG per tablet 1 tablet (1 tablet Oral Given 06/10/17 2204)  metoCLOPramide (REGLAN) injection 10 mg (10 mg Intravenous Given 06/10/17 2308)     Initial Impression / Assessment and Plan / ED Course  I have reviewed the triage vital signs and the nursing notes.  Pertinent labs & imaging results that were available during my care of the patient were reviewed by me and considered in my medical decision making (see chart for details).    Patient is a 84 year old female who presents with ongoing back pain after a fall in mid February of this year.  She had progressive decline since then.  She does not want to eat much because of the pain and she had some ongoing nausea which she feels is related to the pain and the pain medication she is been taking.  She does not get any relief with ibuprofen or Tylenol.  She does not tolerate tramadol.  She has tried lidocaine patches with no relief.  She does have hydrocodone elixir at home but she does not like to take this because it makes her more nauseated.  She does not have any neurologic deficits.  No focal deficits.  No fever or other signs of infection.  No evidence of UTI.  No evidence of pneumonia.  Her blood pressure is soft but she had similar values in the past.  She has a mild elevated creatinine but this is also similar to her recent prior values.  I feel that she is safe for discharge.  She was given a prescription for some Zofran which she states that she does not have at home.  She will try to take her hydrocodone at home along with the Zofran.  I discussed the option of going to rehab.  She is open to this and I advised her to make a follow-up appointment with her PCP to discuss this more.  Her daughter was at bedside during this discussion  as well.   Final Clinical Impressions(s) / ED Diagnoses   Final diagnoses:  Weakness  Acute right-sided low  back pain without sciatica  Failure to thrive (0-17)    ED Discharge Orders        Ordered    ondansetron (ZOFRAN) 4 MG tablet  Every 8 hours PRN     06/10/17 2338       Malvin Johns, MD 06/10/17 2341

## 2017-06-10 NOTE — ED Notes (Signed)
Bed: WA08 Expected date:  Expected time:  Means of arrival:  Comments: EMS/weakness 

## 2017-06-14 NOTE — Telephone Encounter (Signed)
Okay for home health referral.

## 2017-06-15 NOTE — Telephone Encounter (Signed)
Spoke to patient daughter and she stated that her mom was either pretending to be her and cancelled Home health or her sister -in- law was misinformed about the patient situation. Patient daughter [name] stated that [facility] needed to be put back in because it's really needed due to the patient being bed bound. A referral was placed for [facility] 06/15/2017.

## 2017-06-15 NOTE — Telephone Encounter (Signed)
daughter called- home health company called and spoke to pt, who declined service.   Please call back daughter so she can get name of company and phone number and contact info.  Christine Perez - [phone]

## 2017-06-16 ENCOUNTER — Telehealth: Payer: Self-pay | Admitting: Internal Medicine

## 2017-06-16 NOTE — Telephone Encounter (Signed)
Pt requesting refill of Ondansetron(Zofran) and Hydrocodone-acetaminophen(Hycet) 7.5-325 mg/19ml.Zofran  previously filled by another provider.    LOV:05/28/17  Dr. Isaac Bliss on [place]

## 2017-06-16 NOTE — Telephone Encounter (Signed)
Copied from [place] [phone]. Topic: Quick Communication - Rx Refill/Question >> Jun 16, 2017  9:53 AM Arletha Grippe wrote: Medication: ondansetron (ZOFRAN) 4 MG tablet  HYDROcodone-acetaminophen (HYCET) 7.5-325 mg/15 ml solution  Has the patient contacted their pharmacy? No. Needs new rx  (Agent: If no, request that the patient contact the pharmacy for the refill.) Preferred Pharmacy (with phone number or street name): [facility] [place] [phone]  Agent: Please be advised that RX refills may take up to 3 business days. We ask that you follow-up with your pharmacy.

## 2017-06-17 ENCOUNTER — Telehealth: Payer: Self-pay | Admitting: Internal Medicine

## 2017-06-17 ENCOUNTER — Other Ambulatory Visit: Payer: Self-pay | Admitting: Internal Medicine

## 2017-06-17 NOTE — Telephone Encounter (Signed)
Please set up for physical therapy/gait training

## 2017-06-17 NOTE — Telephone Encounter (Signed)
Copied from [place]. Topic: Quick Communication - See Telephone Encounter >> Jun 17, 2017  3:28 PM Ivar Drape wrote: CRM for notification. See Telephone encounter for: 06/17/17. Patient's daughter, Ms Audrea Muscat, [phone] said the patient has fallen multiple times since February and she needs to know what to do about it, or who should she call, or who she can be referred to for help. Please advise ASAP.

## 2017-06-18 ENCOUNTER — Other Ambulatory Visit: Payer: Self-pay

## 2017-06-18 ENCOUNTER — Telehealth: Payer: Self-pay | Admitting: Internal Medicine

## 2017-06-18 DIAGNOSIS — R296 Repeated falls: Secondary | ICD-10-CM

## 2017-06-18 MED ORDER — ONDANSETRON HCL 4 MG PO TABS: 4.0000 mg | ORAL_TABLET | Freq: Three times a day (TID) | ORAL | 0 refills | Status: DC | PRN

## 2017-06-18 MED ORDER — HYDROCODONE-ACETAMINOPHEN 7.5-325 MG/15ML PO SOLN: 5.0000 mL | Freq: Four times a day (QID) | ORAL | 0 refills | Status: DC | PRN

## 2017-06-18 NOTE — Telephone Encounter (Signed)
Informed patient daughter that a referral as placed. She was also checking on the status of her [facility] referral to [facility]. I informed her that I would let her know as soon as possible. She stated that she called [facility] and they didn't receive anything.

## 2017-06-18 NOTE — Telephone Encounter (Signed)
Call to pharmacy- they were waiting for authorization- they ran Rx and will call patient to pick up.

## 2017-06-18 NOTE — Telephone Encounter (Signed)
Okay to refill both.

## 2017-06-18 NOTE — Telephone Encounter (Signed)
Copied from [place] [phone]. Topic: Quick Communication - Rx Refill/Question >> Jun 18, 2017 11:32 AM Oliver Pila B wrote: Medication: ondansetron (ZOFRAN) 4 MG tablet [[id number]]  [employer] called to let the pcp the medication has been approved, call [employer] or pt if needed

## 2017-06-18 NOTE — Telephone Encounter (Signed)
Medication sent in electronically the other printed to be faxed.

## 2017-06-19 ENCOUNTER — Other Ambulatory Visit: Payer: Self-pay

## 2017-06-19 MED ORDER — ONDANSETRON HCL 4 MG PO TABS: 4.0000 mg | ORAL_TABLET | Freq: Three times a day (TID) | ORAL | 0 refills | Status: AC | PRN

## 2017-06-19 MED ORDER — HYDROCODONE-ACETAMINOPHEN 7.5-325 MG/15ML PO SOLN: 5.0000 mL | Freq: Four times a day (QID) | ORAL | 0 refills | Status: AC | PRN

## 2017-06-20 DIAGNOSIS — Z7984 Long term (current) use of oral hypoglycemic drugs: Secondary | ICD-10-CM | POA: Diagnosis not present

## 2017-06-20 DIAGNOSIS — Z9181 History of falling: Secondary | ICD-10-CM | POA: Diagnosis not present

## 2017-06-20 DIAGNOSIS — R296 Repeated falls: Secondary | ICD-10-CM | POA: Diagnosis not present

## 2017-06-20 DIAGNOSIS — H9192 Unspecified hearing loss, left ear: Secondary | ICD-10-CM | POA: Diagnosis not present

## 2017-06-20 DIAGNOSIS — I4891 Unspecified atrial fibrillation: Secondary | ICD-10-CM | POA: Diagnosis not present

## 2017-06-20 DIAGNOSIS — M810 Age-related osteoporosis without current pathological fracture: Secondary | ICD-10-CM | POA: Diagnosis not present

## 2017-06-20 DIAGNOSIS — M545 Low back pain: Secondary | ICD-10-CM | POA: Diagnosis not present

## 2017-06-20 DIAGNOSIS — R531 Weakness: Secondary | ICD-10-CM | POA: Diagnosis not present

## 2017-06-20 DIAGNOSIS — J449 Chronic obstructive pulmonary disease, unspecified: Secondary | ICD-10-CM | POA: Diagnosis not present

## 2017-06-20 DIAGNOSIS — R260 Ataxic gait: Secondary | ICD-10-CM | POA: Diagnosis not present

## 2017-06-22 ENCOUNTER — Telehealth: Payer: Self-pay | Admitting: Internal Medicine

## 2017-06-22 DIAGNOSIS — R260 Ataxic gait: Secondary | ICD-10-CM | POA: Diagnosis not present

## 2017-06-22 DIAGNOSIS — R531 Weakness: Secondary | ICD-10-CM | POA: Diagnosis not present

## 2017-06-22 DIAGNOSIS — M545 Low back pain: Secondary | ICD-10-CM | POA: Diagnosis not present

## 2017-06-22 DIAGNOSIS — R296 Repeated falls: Secondary | ICD-10-CM | POA: Diagnosis not present

## 2017-06-22 DIAGNOSIS — I4891 Unspecified atrial fibrillation: Secondary | ICD-10-CM | POA: Diagnosis not present

## 2017-06-22 DIAGNOSIS — J449 Chronic obstructive pulmonary disease, unspecified: Secondary | ICD-10-CM | POA: Diagnosis not present

## 2017-06-22 NOTE — Telephone Encounter (Signed)
Okay for verbal orders. 

## 2017-06-22 NOTE — Telephone Encounter (Signed)
Okay for verbal orders? Please advise 

## 2017-06-22 NOTE — Telephone Encounter (Signed)
Copied from [place]. Topic: Quick Communication - See Telephone Encounter >> Jun 22, 2017  8:21 AM Boyd Kerbs wrote: CRM for notification.   Margarita Grizzle with [facility] [phone] referral for home health, wants clarification on home health and also wants to request social worker to evaluate for OT and help for family.  Verbal for nursing frequency for 1 x 1; 2 x 2 ; 1 x 3   Can they get verbal for pulse ox.    See Telephone encounter for: 06/22/17.

## 2017-06-22 NOTE — Progress Notes (Deleted)
[facility] Note  07/13/2017     CHIEF COMPLAINT Patient presents for No chief complaint on file.   HISTORY OF PRESENT ILLNESS: Christine Perez is a 84 y.o. female who presents to the clinic today for:   Pt states she tolerated last injection well; Pt states that she was unable to see much of a difference in OS VA since having injection;   Referring physician: Marletta Lor, MD [place], [place] [address]  HISTORICAL INFORMATION:   Selected notes from the MEDICAL RECORD NUMBER Dr. Zadie Rhine pt -- functionally monocular pt secondary to CRVO OS, concern for spot in vision OD  LEE- 10.30.18 (G. Rankin) [BCVA OD: 20/25 OS: CF @ 1'] Ocular Hx- pseudophakia OU, CRVO w/ neovascularization and CME OS s/p IVA #6 (last inj 07.24.18), S/P PRP OS (08.07.18), retinal hemorrhage OS, K scar OD, optic atrophy OS, DES OU PMH- hypothyroidism, HTN, vertigo, A-Fib, hx of breast ca, COPD, CHF, chronic anticoagulation    CURRENT MEDICATIONS: No current outpatient medications on file. (Ophthalmic Drugs)   No current facility-administered medications for this visit.  (Ophthalmic Drugs)   Current Outpatient Medications (Other)  Medication Sig  . acetaminophen (TYLENOL) 500 MG tablet Take 500 mg by mouth every 6 (six) hours as needed for moderate pain or headache.  Marland Kitchen amiodarone (PACERONE) 200 MG tablet Take 1.5 tablets (300 mg total) by mouth daily. (Patient not taking: Reported on 06/10/2017)  . amiodarone (PACERONE) 200 MG tablet TAKE 1 TABLET BY MOUTH TWICE DAILY  . calcium-vitamin D (OSCAL WITH D) 500-200 MG-UNIT per tablet Take 1 tablet by mouth 2 (two) times daily.   . diclofenac (FLECTOR) 1.3 % PTCH Place 1 patch onto the skin 2 (two) times daily. (Patient not taking: Reported on 06/10/2017)  . diclofenac sodium (VOLTAREN) 1 % GEL Apply 4 g topically 4 (four) times daily. (Patient not taking: Reported on 06/10/2017)  . furosemide (LASIX) 20 MG  tablet Take 1 tablet (20 mg total) by mouth daily.  Marland Kitchen HYDROcodone-acetaminophen (HYCET) 7.5-325 mg/15 ml solution Take 5 mLs by mouth every 6 (six) hours as needed for moderate pain or severe pain.  Marland Kitchen ipratropium (ATROVENT HFA) 17 MCG/ACT inhaler Inhale 2 puffs into the lungs every 4 (four) hours as needed for wheezing.  . lidocaine (LIDODERM) 5 % Place 1 patch onto the skin daily. Remove & Discard patch within 12 hours or as directed by MD (Patient not taking: Reported on 06/10/2017)  . lidocaine (LIDODERM) 5 % Place 1 patch onto the skin daily. Remove & Discard patch within 12 hours or as directed by MD (Patient not taking: Reported on 06/10/2017)  . losartan (COZAAR) 100 MG tablet TAKE 1 TABLET BY MOUTH ONCE DAILY  . metoprolol succinate (TOPROL-XL) 50 MG 24 hr tablet TAKE 1 TABLET BY MOUTH TWICE DAILY  . Multiple Vitamin (MULTIVITAMIN) tablet Take 1 tablet by mouth daily.  . ondansetron (ZOFRAN) 4 MG tablet Take 1 tablet (4 mg total) by mouth every 8 (eight) hours as needed for nausea or vomiting.  Marland Kitchen spironolactone (ALDACTONE) 25 MG tablet Take 12.5 mg by mouth daily.  Marland Kitchen SYNTHROID 75 MCG tablet TAKE 1 TABLET BY MOUTH ONCE DAILY  . traMADol (ULTRAM) 50 MG tablet Take 1 tablet (50 mg total) by mouth 2 (two) times daily as needed (pain). (Patient not taking: Reported on 06/10/2017)  . vitamin C (ASCORBIC ACID) 500 MG tablet Take 500 mg by mouth daily.  . vitamin E 100  UNIT capsule Take 100 Units by mouth daily.  Alveda Reasons 15 MG TABS tablet TAKE 1 TABLET BY MOUTH ONCE DAILY WITH SUPPER  . zolpidem (AMBIEN) 5 MG tablet TAKE 1 TABLET BY MOUTH AT BEDTIME (Patient not taking: Reported on 06/10/2017)   Current Facility-Administered Medications (Other)  Medication Route  . Bevacizumab (AVASTIN) SOLN 1.25 mg Intravitreal  . Bevacizumab (AVASTIN) SOLN 1.25 mg Intravitreal      REVIEW OF SYSTEMS:    ALLERGIES Allergies  Allergen Reactions  . Codeine Shortness Of Breath    Difficulty breathing  .  Tramadol Other (See Comments)    Hallucinations and "my feet jerk uncontrollably"     PAST MEDICAL HISTORY Past Medical History:  Diagnosis Date  . Atrial fibrillation ([place])   . CARDIAC ARRHYTHMIA 01/08/2010  . COLONIC POLYPS, HX OF 10/09/2006  . COPD (chronic obstructive pulmonary disease) ([place])   . HYPERLIPIDEMIA 10/09/2006  . HYPERTENSION 10/09/2006  . HYPOTHYROIDISM 10/09/2006  . LASSITUDE 08/15/2009  . OSTEOPOROSIS 10/09/2006  . VERTIGO 08/07/2009  . Wears glasses   . Wears hearing aid    left   Past Surgical History:  Procedure Laterality Date  . ABDOMINAL HYSTERECTOMY    . APPENDECTOMY    . BREAST SURGERY     left - lumpectomy  . CARDIOVERSION N/A 08/09/2013   Procedure: CARDIOVERSION;  Surgeon: Candee Furbish, MD;  Location: [facility] ENDOSCOPY;  Service: Cardiovascular;  Laterality: N/A;  . CATARACT EXTRACTION    . CHOLECYSTECTOMY    . PACEMAKER PLACEMENT N/A 12/05/2013  . PARTIAL MASTECTOMY WITH NEEDLE LOCALIZATION Left 06/29/2012   Procedure: PARTIAL MASTECTOMY WITH NEEDLE LOCALIZATION;  Surgeon: Adin Hector, MD;  Location: [facility];  Service: General;  Laterality: Left;  . TONSILLECTOMY    . YAG LASER APPLICATION      FAMILY HISTORY Family History  Problem Relation Age of Onset  . Cancer Mother   . Heart failure Father   . Cancer Sister   . Stroke Sister     SOCIAL HISTORY Social History   Tobacco Use  . Smoking status: Never Smoker  . Smokeless tobacco: Never Used  Substance Use Topics  . Alcohol use: No  . Drug use: No         OPHTHALMIC EXAM:   Not recorded      IMAGING AND PROCEDURES  Imaging and Procedures for 06/22/17           ASSESSMENT/PLAN:    ICD-10-CM   1. Central retinal vein occlusion with macular edema of left eye H34.8120   2. PCO (posterior capsular opacification), right H26.491   3. Hypertensive retinopathy of both eyes H35.033   4. Pseudophakia of both eyes Z96.1   5. Dry eye syndrome of both eyes  H04.123   6. Retinal edema H35.81     1. CRVO w/ CME, OS  - s/p IVA OS #6 (07.24.18 - Rankin), s/p PRP OS (08.07.18 - Rankin), IVA OS #7 (11.20.18), #8 (12.18.18) - 7 week interval, #9 (02.06.19) - 8 week interval  - OCT today with significant improvement / resolution of CME, 7 wks post injection  - recommend IVA OS #10 today (***)   - RBA of procedure discussed, questions answered  - informed consent obtained and signed  - see procedure note  - will f/u in 8 wks -- repeat DFE/OCT, likely IVA OS, consider PRP fill in OS  2. PCO OD- - S/P Yag Cap OD (01.29.19) - looks great - VA improved to 20/25  from 20/40 OD  3. Hypertensive retinopathy OU - discussed importance of tight BP control - monitor  4. Pseudophakia OU - s/p CE/IOL OU by Dr. Elliot Dally - beautiful surgery, doing well - monitor  5. Dry eye syndrome OU - likely visually significant and related to chief complaint of transient intermittent spots in vision - recommend artificial tears and/or lubricating ointment 4-6x/day, as needed   Ophthalmic Meds Ordered this visit:  No orders of the defined types were placed in this encounter.      No follow-ups on file.  There are no Patient Instructions on file for this visit.   Explained the diagnoses, plan, and follow up with the patient and they expressed understanding.  Patient expressed understanding of the importance of proper follow up care.   This document serves as a record of services personally performed by Gardiner Sleeper, MD, PhD. It was created on their behalf by Catha Brow, [place], a certified ophthalmic assistant. The creation of this record is the provider's dictation and/or activities during the visit.  Electronically signed by: Catha Brow, COA  06/22/17 4:16 PM   Gardiner Sleeper, M.D., Ph.D. Diseases & Surgery of the Retina and Vitreous [facility] 06/22/17     Abbreviations: M myopia (nearsighted); A astigmatism; H  hyperopia (farsighted); P presbyopia; Mrx spectacle prescription;  CTL contact lenses; OD right eye; OS left eye; OU both eyes  XT exotropia; ET esotropia; PEK punctate epithelial keratitis; PEE punctate epithelial erosions; DES dry eye syndrome; MGD meibomian gland dysfunction; ATs artificial tears; PFAT's preservative free artificial tears; [place] nuclear sclerotic cataract; PSC posterior subcapsular cataract; ERM epi-retinal membrane; PVD posterior vitreous detachment; RD retinal detachment; DM diabetes mellitus; DR diabetic retinopathy; NPDR non-proliferative diabetic retinopathy; PDR proliferative diabetic retinopathy; CSME clinically significant macular edema; DME diabetic macular edema; dbh dot blot hemorrhages; CWS cotton wool spot; POAG primary open angle glaucoma; C/D cup-to-disc ratio; HVF humphrey visual field; GVF goldmann visual field; OCT optical coherence tomography; IOP intraocular pressure; BRVO Branch retinal vein occlusion; CRVO central retinal vein occlusion; CRAO central retinal artery occlusion; BRAO branch retinal artery occlusion; RT retinal tear; SB scleral buckle; PPV pars plana vitrectomy; VH Vitreous hemorrhage; PRP panretinal laser photocoagulation; IVK intravitreal kenalog; VMT vitreomacular traction; MH Macular hole;  NVD neovascularization of the disc; NVE neovascularization elsewhere; AREDS age related eye disease study; ARMD age related macular degeneration; POAG primary open angle glaucoma; EBMD epithelial/anterior basement membrane dystrophy; ACIOL anterior chamber intraocular lens; IOL intraocular lens; PCIOL posterior chamber intraocular lens; Phaco/IOL phacoemulsification with intraocular lens placement; [place] photorefractive keratectomy; LASIK laser assisted in situ keratomileusis; HTN hypertension; DM diabetes mellitus; COPD chronic obstructive pulmonary disease

## 2017-06-23 ENCOUNTER — Emergency Department (HOSPITAL_COMMUNITY): Payer: Medicare Other

## 2017-06-23 ENCOUNTER — Telehealth: Payer: Self-pay | Admitting: Internal Medicine

## 2017-06-23 ENCOUNTER — Encounter (INDEPENDENT_AMBULATORY_CARE_PROVIDER_SITE_OTHER): Payer: Medicare Other | Admitting: Ophthalmology

## 2017-06-23 ENCOUNTER — Encounter (HOSPITAL_COMMUNITY): Payer: Self-pay

## 2017-06-23 ENCOUNTER — Inpatient Hospital Stay (HOSPITAL_COMMUNITY)
Admission: EM | Admit: 2017-06-23 | Discharge: 2017-07-15 | DRG: 377 | Disposition: E | Payer: Medicare Other | Attending: Internal Medicine | Admitting: Internal Medicine

## 2017-06-23 ENCOUNTER — Other Ambulatory Visit: Payer: Self-pay

## 2017-06-23 DIAGNOSIS — D62 Acute posthemorrhagic anemia: Secondary | ICD-10-CM | POA: Diagnosis not present

## 2017-06-23 DIAGNOSIS — R188 Other ascites: Secondary | ICD-10-CM | POA: Diagnosis present

## 2017-06-23 DIAGNOSIS — M549 Dorsalgia, unspecified: Secondary | ICD-10-CM | POA: Diagnosis not present

## 2017-06-23 DIAGNOSIS — W19XXXS Unspecified fall, sequela: Secondary | ICD-10-CM | POA: Diagnosis present

## 2017-06-23 DIAGNOSIS — Z9049 Acquired absence of other specified parts of digestive tract: Secondary | ICD-10-CM

## 2017-06-23 DIAGNOSIS — Z9849 Cataract extraction status, unspecified eye: Secondary | ICD-10-CM

## 2017-06-23 DIAGNOSIS — K5900 Constipation, unspecified: Secondary | ICD-10-CM | POA: Diagnosis not present

## 2017-06-23 DIAGNOSIS — Z79899 Other long term (current) drug therapy: Secondary | ICD-10-CM

## 2017-06-23 DIAGNOSIS — K760 Fatty (change of) liver, not elsewhere classified: Secondary | ICD-10-CM | POA: Diagnosis present

## 2017-06-23 DIAGNOSIS — K625 Hemorrhage of anus and rectum: Secondary | ICD-10-CM | POA: Diagnosis not present

## 2017-06-23 DIAGNOSIS — Z79891 Long term (current) use of opiate analgesic: Secondary | ICD-10-CM

## 2017-06-23 DIAGNOSIS — I10 Essential (primary) hypertension: Secondary | ICD-10-CM | POA: Diagnosis not present

## 2017-06-23 DIAGNOSIS — R1030 Lower abdominal pain, unspecified: Secondary | ICD-10-CM | POA: Diagnosis not present

## 2017-06-23 DIAGNOSIS — Z515 Encounter for palliative care: Secondary | ICD-10-CM | POA: Diagnosis present

## 2017-06-23 DIAGNOSIS — N184 Chronic kidney disease, stage 4 (severe): Secondary | ICD-10-CM

## 2017-06-23 DIAGNOSIS — K5730 Diverticulosis of large intestine without perforation or abscess without bleeding: Secondary | ICD-10-CM | POA: Diagnosis present

## 2017-06-23 DIAGNOSIS — S2241XS Multiple fractures of ribs, right side, sequela: Secondary | ICD-10-CM

## 2017-06-23 DIAGNOSIS — M8088XS Other osteoporosis with current pathological fracture, vertebra(e), sequela: Secondary | ICD-10-CM | POA: Diagnosis present

## 2017-06-23 DIAGNOSIS — S22080A Wedge compression fracture of T11-T12 vertebra, initial encounter for closed fracture: Secondary | ICD-10-CM | POA: Diagnosis present

## 2017-06-23 DIAGNOSIS — R195 Other fecal abnormalities: Secondary | ICD-10-CM

## 2017-06-23 DIAGNOSIS — G8929 Other chronic pain: Secondary | ICD-10-CM | POA: Diagnosis present

## 2017-06-23 DIAGNOSIS — R945 Abnormal results of liver function studies: Secondary | ICD-10-CM

## 2017-06-23 DIAGNOSIS — S32030A Wedge compression fracture of third lumbar vertebra, initial encounter for closed fracture: Secondary | ICD-10-CM | POA: Diagnosis not present

## 2017-06-23 DIAGNOSIS — Z86010 Personal history of colonic polyps: Secondary | ICD-10-CM | POA: Diagnosis not present

## 2017-06-23 DIAGNOSIS — Z66 Do not resuscitate: Secondary | ICD-10-CM | POA: Diagnosis present

## 2017-06-23 DIAGNOSIS — F0390 Unspecified dementia without behavioral disturbance: Secondary | ICD-10-CM | POA: Diagnosis present

## 2017-06-23 DIAGNOSIS — I4891 Unspecified atrial fibrillation: Secondary | ICD-10-CM | POA: Diagnosis not present

## 2017-06-23 DIAGNOSIS — R0602 Shortness of breath: Secondary | ICD-10-CM

## 2017-06-23 DIAGNOSIS — R110 Nausea: Secondary | ICD-10-CM | POA: Diagnosis present

## 2017-06-23 DIAGNOSIS — R269 Unspecified abnormalities of gait and mobility: Secondary | ICD-10-CM | POA: Diagnosis present

## 2017-06-23 DIAGNOSIS — S2239XA Fracture of one rib, unspecified side, initial encounter for closed fracture: Secondary | ICD-10-CM | POA: Diagnosis not present

## 2017-06-23 DIAGNOSIS — I5033 Acute on chronic diastolic (congestive) heart failure: Secondary | ICD-10-CM | POA: Diagnosis present

## 2017-06-23 DIAGNOSIS — H9190 Unspecified hearing loss, unspecified ear: Secondary | ICD-10-CM | POA: Diagnosis present

## 2017-06-23 DIAGNOSIS — I509 Heart failure, unspecified: Secondary | ICD-10-CM | POA: Diagnosis not present

## 2017-06-23 DIAGNOSIS — R748 Abnormal levels of other serum enzymes: Secondary | ICD-10-CM | POA: Diagnosis present

## 2017-06-23 DIAGNOSIS — R404 Transient alteration of awareness: Secondary | ICD-10-CM | POA: Diagnosis not present

## 2017-06-23 DIAGNOSIS — N179 Acute kidney failure, unspecified: Secondary | ICD-10-CM

## 2017-06-23 DIAGNOSIS — I130 Hypertensive heart and chronic kidney disease with heart failure and stage 1 through stage 4 chronic kidney disease, or unspecified chronic kidney disease: Secondary | ICD-10-CM | POA: Diagnosis present

## 2017-06-23 DIAGNOSIS — K921 Melena: Principal | ICD-10-CM | POA: Diagnosis present

## 2017-06-23 DIAGNOSIS — Y92009 Unspecified place in unspecified non-institutional (private) residence as the place of occurrence of the external cause: Secondary | ICD-10-CM

## 2017-06-23 DIAGNOSIS — R531 Weakness: Secondary | ICD-10-CM | POA: Diagnosis not present

## 2017-06-23 DIAGNOSIS — J9601 Acute respiratory failure with hypoxia: Secondary | ICD-10-CM | POA: Diagnosis not present

## 2017-06-23 DIAGNOSIS — E785 Hyperlipidemia, unspecified: Secondary | ICD-10-CM | POA: Diagnosis present

## 2017-06-23 DIAGNOSIS — R55 Syncope and collapse: Secondary | ICD-10-CM

## 2017-06-23 DIAGNOSIS — I480 Paroxysmal atrial fibrillation: Secondary | ICD-10-CM | POA: Diagnosis present

## 2017-06-23 DIAGNOSIS — R1084 Generalized abdominal pain: Secondary | ICD-10-CM | POA: Diagnosis not present

## 2017-06-23 DIAGNOSIS — R262 Difficulty in walking, not elsewhere classified: Secondary | ICD-10-CM | POA: Diagnosis not present

## 2017-06-23 DIAGNOSIS — R260 Ataxic gait: Secondary | ICD-10-CM | POA: Diagnosis not present

## 2017-06-23 DIAGNOSIS — R296 Repeated falls: Secondary | ICD-10-CM | POA: Diagnosis not present

## 2017-06-23 DIAGNOSIS — Z9012 Acquired absence of left breast and nipple: Secondary | ICD-10-CM

## 2017-06-23 DIAGNOSIS — Z9181 History of falling: Secondary | ICD-10-CM

## 2017-06-23 DIAGNOSIS — Z823 Family history of stroke: Secondary | ICD-10-CM

## 2017-06-23 DIAGNOSIS — E039 Hypothyroidism, unspecified: Secondary | ICD-10-CM | POA: Diagnosis present

## 2017-06-23 DIAGNOSIS — J449 Chronic obstructive pulmonary disease, unspecified: Secondary | ICD-10-CM | POA: Diagnosis present

## 2017-06-23 DIAGNOSIS — M546 Pain in thoracic spine: Secondary | ICD-10-CM | POA: Diagnosis not present

## 2017-06-23 DIAGNOSIS — Z853 Personal history of malignant neoplasm of breast: Secondary | ICD-10-CM

## 2017-06-23 DIAGNOSIS — R710 Precipitous drop in hematocrit: Secondary | ICD-10-CM | POA: Diagnosis not present

## 2017-06-23 DIAGNOSIS — Z09 Encounter for follow-up examination after completed treatment for conditions other than malignant neoplasm: Secondary | ICD-10-CM

## 2017-06-23 DIAGNOSIS — M545 Low back pain: Secondary | ICD-10-CM | POA: Diagnosis not present

## 2017-06-23 DIAGNOSIS — I129 Hypertensive chronic kidney disease with stage 1 through stage 4 chronic kidney disease, or unspecified chronic kidney disease: Secondary | ICD-10-CM | POA: Diagnosis not present

## 2017-06-23 DIAGNOSIS — R109 Unspecified abdominal pain: Secondary | ICD-10-CM | POA: Diagnosis present

## 2017-06-23 DIAGNOSIS — Z90710 Acquired absence of both cervix and uterus: Secondary | ICD-10-CM

## 2017-06-23 DIAGNOSIS — Z7901 Long term (current) use of anticoagulants: Secondary | ICD-10-CM

## 2017-06-23 DIAGNOSIS — S22020A Wedge compression fracture of second thoracic vertebra, initial encounter for closed fracture: Secondary | ICD-10-CM | POA: Diagnosis not present

## 2017-06-23 DIAGNOSIS — Z950 Presence of cardiac pacemaker: Secondary | ICD-10-CM

## 2017-06-23 DIAGNOSIS — S32030S Wedge compression fracture of third lumbar vertebra, sequela: Secondary | ICD-10-CM

## 2017-06-23 DIAGNOSIS — I495 Sick sinus syndrome: Secondary | ICD-10-CM | POA: Diagnosis present

## 2017-06-23 DIAGNOSIS — Z79890 Hormone replacement therapy (postmenopausal): Secondary | ICD-10-CM

## 2017-06-23 DIAGNOSIS — Z974 Presence of external hearing-aid: Secondary | ICD-10-CM

## 2017-06-23 DIAGNOSIS — Z791 Long term (current) use of non-steroidal anti-inflammatories (NSAID): Secondary | ICD-10-CM

## 2017-06-23 DIAGNOSIS — Z8249 Family history of ischemic heart disease and other diseases of the circulatory system: Secondary | ICD-10-CM

## 2017-06-23 DIAGNOSIS — K922 Gastrointestinal hemorrhage, unspecified: Secondary | ICD-10-CM

## 2017-06-23 DIAGNOSIS — R7989 Other specified abnormal findings of blood chemistry: Secondary | ICD-10-CM

## 2017-06-23 DIAGNOSIS — I361 Nonrheumatic tricuspid (valve) insufficiency: Secondary | ICD-10-CM | POA: Diagnosis not present

## 2017-06-23 LAB — POC OCCULT BLOOD, ED: Fecal Occult Bld: POSITIVE — AB

## 2017-06-23 LAB — CBC WITH DIFFERENTIAL/PLATELET
BASOS ABS: 0 10*3/uL (ref 0.0–0.1)
BASOS PCT: 0 %
EOS ABS: 0 10*3/uL (ref 0.0–0.7)
EOS PCT: 0 %
HEMATOCRIT: 37.9 % (ref 36.0–46.0)
Hemoglobin: 12.2 g/dL (ref 12.0–15.0)
Lymphocytes Relative: 4 %
Lymphs Abs: 0.3 10*3/uL — ABNORMAL LOW (ref 0.7–4.0)
MCH: 32.9 pg (ref 26.0–34.0)
MCHC: 32.2 g/dL (ref 30.0–36.0)
MCV: 102.2 fL — ABNORMAL HIGH (ref 78.0–100.0)
MONO ABS: 0.7 10*3/uL (ref 0.1–1.0)
Monocytes Relative: 7 %
Neutro Abs: 8.3 10*3/uL — ABNORMAL HIGH (ref 1.7–7.7)
Neutrophils Relative %: 89 %
Platelets: 121 10*3/uL — ABNORMAL LOW (ref 150–400)
RBC: 3.71 MIL/uL — AB (ref 3.87–5.11)
RDW: 16.8 % — ABNORMAL HIGH (ref 11.5–15.5)
WBC: 9.3 10*3/uL (ref 4.0–10.5)

## 2017-06-23 LAB — URINALYSIS, ROUTINE W REFLEX MICROSCOPIC
BILIRUBIN URINE: NEGATIVE
Glucose, UA: NEGATIVE mg/dL
HGB URINE DIPSTICK: NEGATIVE
KETONES UR: NEGATIVE mg/dL
Leukocytes, UA: NEGATIVE
Nitrite: NEGATIVE
PH: 5 (ref 5.0–8.0)
Protein, ur: NEGATIVE mg/dL
Specific Gravity, Urine: 1.011 (ref 1.005–1.030)

## 2017-06-23 LAB — COMPREHENSIVE METABOLIC PANEL
ALBUMIN: 2.9 g/dL — AB (ref 3.5–5.0)
ALK PHOS: 185 U/L — AB (ref 38–126)
ALT: 326 U/L — ABNORMAL HIGH (ref 14–54)
AST: 360 U/L — ABNORMAL HIGH (ref 15–41)
Anion gap: 12 (ref 5–15)
BILIRUBIN TOTAL: 1.6 mg/dL — AB (ref 0.3–1.2)
BUN: 33 mg/dL — ABNORMAL HIGH (ref 6–20)
CALCIUM: 10.2 mg/dL (ref 8.9–10.3)
CO2: 26 mmol/L (ref 22–32)
Chloride: 102 mmol/L (ref 101–111)
Creatinine, Ser: 1.83 mg/dL — ABNORMAL HIGH (ref 0.44–1.00)
GFR calc non Af Amer: 23 mL/min — ABNORMAL LOW (ref >60)
GFR, EST AFRICAN AMERICAN: 27 mL/min — AB (ref >60)
GLUCOSE: 145 mg/dL — AB (ref 65–99)
Potassium: 3.9 mmol/L (ref 3.5–5.1)
Sodium: 140 mmol/L (ref 135–145)
TOTAL PROTEIN: 6.8 g/dL (ref 6.5–8.1)

## 2017-06-23 LAB — TROPONIN I: Troponin I: 0.05 ng/mL (ref <0.03)

## 2017-06-23 LAB — PROTIME-INR
INR: 1.04
PROTHROMBIN TIME: 13.5 s (ref 11.4–15.2)

## 2017-06-23 LAB — CBG MONITORING, ED: GLUCOSE-CAPILLARY: 135 mg/dL — AB (ref 65–99)

## 2017-06-23 MED ORDER — KETOROLAC TROMETHAMINE 15 MG/ML IJ SOLN
15.0000 mg | Freq: Once | INTRAMUSCULAR | Status: AC
  Administered 2017-06-23: 15 mg via INTRAVENOUS
  Filled 2017-06-23: qty 1

## 2017-06-23 MED ORDER — SODIUM CHLORIDE 0.9 % IV BOLUS
1000.0000 mL | Freq: Once | INTRAVENOUS | Status: AC
  Administered 2017-06-23: 1000 mL via INTRAVENOUS

## 2017-06-23 MED ORDER — SODIUM CHLORIDE 0.9 % IV SOLN
INTRAVENOUS | Status: DC
  Administered 2017-06-23 – 2017-06-24 (×2): via INTRAVENOUS

## 2017-06-23 MED ORDER — PANTOPRAZOLE SODIUM 40 MG IV SOLR
40.0000 mg | Freq: Once | INTRAVENOUS | Status: AC
Start: 2017-06-23 — End: 2017-06-23
  Administered 2017-06-23: 40 mg via INTRAVENOUS
  Filled 2017-06-23: qty 40

## 2017-06-23 MED ORDER — ONDANSETRON HCL 4 MG/2ML IJ SOLN
4.0000 mg | Freq: Once | INTRAMUSCULAR | Status: AC
  Administered 2017-06-23: 4 mg via INTRAVENOUS
  Filled 2017-06-23: qty 2

## 2017-06-23 NOTE — ED Notes (Signed)
PT HAS PACEMAKER

## 2017-06-23 NOTE — ED Notes (Signed)
Patient transported to X-ray 

## 2017-06-23 NOTE — ED Notes (Signed)
Bed: WA01 Expected date:  Expected time:  Means of arrival:  Comments: Triage pt ([place])

## 2017-06-23 NOTE — Telephone Encounter (Signed)
Copied from [place] [phone]. Topic: Quick Communication - See Telephone Encounter >> Jun 23, 2017  3:38 PM Boyd Kerbs wrote: CRM for notification.  Vickey Sages [phone] / [phone]  Fax # [phone]  Need written order faxed:  needing hospital bed.  95 % of time in bed now.  PON intake diminished significatly Right ear pressure areas - stage 1   - top measures 0.2 x 0.4 x 0.1  scabbed over,  very red and tender but not warm to touch. 2-area half way down  0.2 x 0.3 x 0.1 open but dry and red and tender only She sleeps on this side.  Asked Daughter to keep her off right side as much as possible. She is using neosporin on it. Does doctor want to use any different treatment?   Daughter took her to bathroom last night and she fell. said she was not injured .   Vitals 98.7;  Pulse 68 and reg. ; respiration 18 ;  BP 120/60 ; oxygen 94% on room air.    See Telephone encounter for: 06/22/2017.

## 2017-06-23 NOTE — ED Notes (Signed)
PT DOES HAVE PACEMAKER

## 2017-06-23 NOTE — ED Notes (Signed)
ED TO INPATIENT HANDOFF REPORT  Name/Age/Gender Christine Perez 84 y.o. female  Code Status Code Status History    Date Active Date Inactive Code Status Order ID Comments User Context   03/02/2016 1217 03/04/2016 2130 Full Code [id number]  Maren Reamer, MD ED      Home/SNF/Other Home  Chief Complaint Syncopal Episode  Level of Care/Admitting Diagnosis ED Disposition    ED Disposition Condition [facility] Area: [facility] [[id number]]  Level of Care: Telemetry [5]  Admit to tele based on following criteria: Eval of Syncope  Diagnosis: Syncope [206001]  Admitting Physician: Merton Border [[id number]]  Attending Physician: Laren Everts, [place]  Estimated length of stay: past midnight tomorrow  Certification:: I certify this patient will need inpatient services for at least 2 midnights  PT Class (Do Not Modify): Inpatient [101]  PT Acc Code (Do Not Modify): Private [1]       Medical History Past Medical History:  Diagnosis Date  . Atrial fibrillation ([place])   . CARDIAC ARRHYTHMIA 01/08/2010  . COLONIC POLYPS, HX OF 10/09/2006  . COPD (chronic obstructive pulmonary disease) ([place])   . HYPERLIPIDEMIA 10/09/2006  . HYPERTENSION 10/09/2006  . HYPOTHYROIDISM 10/09/2006  . LASSITUDE 08/15/2009  . OSTEOPOROSIS 10/09/2006  . VERTIGO 08/07/2009  . Wears glasses   . Wears hearing aid    left    Allergies Allergies  Allergen Reactions  . Codeine Shortness Of Breath    Difficulty breathing  . Tramadol Other (See Comments)    Hallucinations and "my feet jerk uncontrollably"     IV Location/Drains/Wounds Patient Lines/Drains/Airways Status   Active Line/Drains/Airways    Name:   Placement date:   Placement time:   Site:   Days:   Peripheral IV 06/24/2017 Left Antecubital   06/18/2017    1831    Antecubital   less than 1   Peripheral IV 07/03/2017 Right Wrist   06/28/2017    1832    Wrist   less than 1   External Urinary Catheter   07/01/2017    1831    -    less than 1          Labs/Imaging Results for orders placed or performed during the hospital encounter of 07/10/2017 (from the past 48 hour(s))  CBC WITH DIFFERENTIAL     Status: Abnormal   Collection Time: 07/13/2017  5:35 PM  Result Value Ref Range   WBC 9.3 4.0 - 10.5 K/uL   RBC 3.71 (L) 3.87 - 5.11 MIL/uL   Hemoglobin 12.2 12.0 - 15.0 g/dL   HCT 37.9 36.0 - 46.0 %   MCV 102.2 (H) 78.0 - 100.0 fL   MCH 32.9 26.0 - 34.0 pg   MCHC 32.2 30.0 - 36.0 g/dL   RDW 16.8 (H) 11.5 - 15.5 %   Platelets 121 (L) 150 - 400 K/uL    Comment: REPEATED TO VERIFY SPECIMEN CHECKED FOR CLOTS PLATELET COUNT CONFIRMED BY SMEAR    Neutrophils Relative % 89 %   Neutro Abs 8.3 (H) 1.7 - 7.7 K/uL   Lymphocytes Relative 4 %   Lymphs Abs 0.3 (L) 0.7 - 4.0 K/uL   Monocytes Relative 7 %   Monocytes Absolute 0.7 0.1 - 1.0 K/uL   Eosinophils Relative 0 %   Eosinophils Absolute 0.0 0.0 - 0.7 K/uL   Basophils Relative 0 %   Basophils Absolute 0.0 0.0 - 0.1 K/uL    Comment: Performed at [facility], [place] [address], [place], [place] [address]  Comprehensive metabolic panel     Status: Abnormal   Collection Time: 06/29/2017  5:35 PM  Result Value Ref Range   Sodium 140 135 - 145 mmol/L   Potassium 3.9 3.5 - 5.1 mmol/L   Chloride 102 101 - 111 mmol/L   CO2 26 22 - 32 mmol/L   Glucose, Bld 145 (H) 65 - 99 mg/dL   BUN 33 (H) 6 - 20 mg/dL   Creatinine, Ser 1.83 (H) 0.44 - 1.00 mg/dL   Calcium 10.2 8.9 - 10.3 mg/dL   Total Protein 6.8 6.5 - 8.1 g/dL   Albumin 2.9 (L) 3.5 - 5.0 g/dL   AST 360 (H) 15 - 41 U/L   ALT 326 (H) 14 - 54 U/L   Alkaline Phosphatase 185 (H) 38 - 126 U/L   Total Bilirubin 1.6 (H) 0.3 - 1.2 mg/dL   GFR calc non Af Amer 23 (L) >60 mL/min   GFR calc Af Amer 27 (L) >60 mL/min    Comment: (NOTE) The eGFR has been calculated using the CKD EPI equation. This calculation has not been validated in all clinical situations. eGFR's persistently <60 mL/min signify possible  Chronic Kidney Disease.    Anion gap 12 5 - 15    Comment: Performed at [facility], [place] [address] [place], [place] [address]  Troponin I     Status: Abnormal   Collection Time: 07/06/2017  5:35 PM  Result Value Ref Range   Troponin I 0.05 (HH) <0.03 ng/mL    Comment: CRITICAL RESULT CALLED TO, READ BACK BY AND VERIFIED WITH: Aldona Bar. RN _0  ON 04.09.19 BY COHEN,K Performed at [facility], [place] [address] [place], [place] [address]   Protime-INR     Status: None   Collection Time: 06/25/2017  5:35 PM  Result Value Ref Range   Prothrombin Time 13.5 11.4 - 15.2 seconds   INR 1.04     Comment: Performed at [facility], [place] [address], [place], [place] [address]  CBG monitoring, ED     Status: Abnormal   Collection Time: 06/25/2017  5:54 PM  Result Value Ref Range   Glucose-Capillary 135 (H) 65 - 99 mg/dL  POC occult blood, ED Provider will collect     Status: Abnormal   Collection Time: 06/21/2017  6:57 PM  Result Value Ref Range   Fecal Occult Bld POSITIVE (A) NEGATIVE  Type and screen [place]     Status: None   Collection Time: 07/06/2017  8:25 PM  Result Value Ref Range   ABO/RH(D) O NEG    Antibody Screen NEG    Sample Expiration      06/26/2017 Performed at [facility], [place] [address] [place], [place] [address]   Urinalysis, Routine w reflex microscopic     Status: Abnormal   Collection Time: 06/25/2017  8:30 PM  Result Value Ref Range   Color, Urine YELLOW YELLOW   APPearance HAZY (A) CLEAR   Specific Gravity, Urine 1.011 1.005 - 1.030   pH 5.0 5.0 - 8.0   Glucose, UA NEGATIVE NEGATIVE mg/dL   Hgb urine dipstick NEGATIVE NEGATIVE   Bilirubin Urine NEGATIVE NEGATIVE   Ketones, ur NEGATIVE NEGATIVE mg/dL   Protein, ur NEGATIVE NEGATIVE mg/dL   Nitrite NEGATIVE NEGATIVE   Leukocytes, UA NEGATIVE NEGATIVE    Comment: Performed at [place]  [address] [place], [place]  [address]   Dg Lumbar Spine Complete  Result Date: 06/22/2017 CLINICAL DATA:  Low back pain. Right flank pain. Syncopal episode. Initial encounter. EXAM: LUMBAR SPINE - COMPLETE 4+ VIEW COMPARISON:  05/14/2017 FINDINGS: There is transitional lumbosacral anatomy, and the transitional segment will be considered a partially lumbarized S1 for consistency with the prior study. The bones are diffusely osteopenic. An L3 compression fracture is again seen with similar severe vertebral body height loss. A mild T12 compression fracture is less well profiled on today's lateral radiograph than on the prior study but is without grossly progressive vertebral body height loss. No new compression fracture is identified. Mild upper lumbar levoscoliosis is noted. No significant listhesis is seen. Disc space narrowing at L2-3 is similar to the prior study. Mild lower lumbar facet arthrosis is noted. There is aortic atherosclerosis. IMPRESSION: Chronic lumbar and lower thoracic compression fractures without evidence of acute osseous abnormality. Electronically Signed   By: Logan Bores M.D.   On: 06/24/2017 18:39   Dg Abdomen Acute W/chest  Result Date: 06/27/2017 CLINICAL DATA:  84 year old female status post syncopal episode while using the bathroom. Lower abdominal pain. EXAM: DG ABDOMEN ACUTE W/ 1V CHEST COMPARISON:  Chest radiographs 06/10/2017 and earlier. Lumbar radiographs 05/14/2017. FINDINGS: Semi upright AP view of the chest. Stable cardiomegaly and mediastinal contours. Stable left chest cardiac pacemaker. Calcified aortic atherosclerosis. Stable lung volumes. No pneumothorax, pulmonary edema or acute pulmonary opacity. Chronic blunting of the right costophrenic angle. No pneumoperitoneum. Non obstructed bowel gas pattern. Left-side-down lateral decubitus view of the abdomen also demonstrates no free air. Stable visualized osseous structures, including osteopenia and L3 compression fracture.  A round 7-8 millimeter calcification projects to the right of the mid lumbar spine and is stable since February. IMPRESSION: 1.  No acute cardiopulmonary abnormality. 2. Normal bowel gas pattern, no free air. 3. A 7-8 mm right abdominal calcification is stable since February and of uncertain etiology and significance. Correlate for hematuria. Electronically Signed   By: Genevie Ann M.D.   On: 06/16/2017 18:36    Pending Labs Unresulted Labs (From admission, onward)   Start     Ordered   06/26/2017 2025  ABO/Rh  Once,   R     06/22/2017 2025   Signed and Held  Troponin I  Now then every 6 hours,   R     Signed and Held   Signed and Held  Hemoglobin  Now then every 8 hours,   R     Signed and Held   Visual merchandiser and Held  CBC  Tomorrow morning,   R     Signed and Held   Signed and Held  Type and screen [place]  Once,   R    Comments:  [place]    Signed and Held   Signed and Held  Hemoglobin and Hematocrit  Once,   R    Comments:  PRE TRANSFUSION    Signed and Held   Signed and Held  Prepare RBC  (Adult Blood Administration - Red Blood Cells)  Once,   R    Question Answer Comment  # of Units 2 units   Transfusion Indications Symptomatic Anemia   If emergent release call blood bank Not emergent release      Signed and Held      Vitals/Pain Today's Vitals   06/20/2017 2230 07/07/2017 2245 07/01/2017 2300 06/29/2017 2315  BP: (!) 108/59 97/71  (!) 116/56  Pulse: (!) 59  60 (!) 59 (!) 59  Resp: 15 (!) _0 SpO2: 98% 99% (!) 81% 100%  Weight:      Height:      PainSc:        Isolation Precautions No active isolations  Medications Medications  sodium chloride 0.9 % bolus 1,000 mL (0 mLs Intravenous Stopped 06/22/2017 1853)    And  0.9 %  sodium chloride infusion ( Intravenous New Bag/Given 06/27/2017 1801)  ondansetron (ZOFRAN) injection 4 mg (4 mg Intravenous Given 06/21/2017 1748)  ketorolac (TORADOL) 15 MG/ML injection 15 mg (15 mg Intravenous Given  06/26/2017 1748)  sodium chloride 0.9 % bolus 1,000 mL (0 mLs Intravenous Stopped 07/10/2017 2024)  pantoprazole (PROTONIX) injection 40 mg (40 mg Intravenous Given 07/05/2017 1935)    Mobility walks with device

## 2017-06-23 NOTE — ED Notes (Signed)
Pt called to be triaged with no response

## 2017-06-23 NOTE — ED Triage Notes (Signed)
Per EMS-states had a syncopal episode while going to bathroom, family helping her on the commode-family noticed blood in the commode, not sure if it was GI or GU-complaining from right sided flank pain but from fall in February 15 th-possible UTI as well

## 2017-06-23 NOTE — Telephone Encounter (Signed)
Left message for Christine Perez to return call.

## 2017-06-23 NOTE — ED Provider Notes (Signed)
[facility] DEPT Provider Note   CSN: [id number] Arrival date & time: 06/25/2017  1619     History   Chief Complaint Chief Complaint  Patient presents with  . Loss of Consciousness    HPI Christine Perez is a 84 y.o. female.  Pt presents to the ED today b/c of syncopal episode in the bathroom.  The pt's family was helping her on the commode.  Blood in toilet per EMS (?source).  Pt c/o low back pain from a fall in Feb.  The sustained a compression fx then and was rx'd a TLSO brace.  The pt is not wearing it now.  She has had trouble with pain relief for back as the stronger pain meds make her nauseous and constipated.  The pt was here on 3/27 for the same.  Pt's daughter said pt has not really been out of bed since the fall in Feb.  The pt also has a hx of a.fib and is on Xarelto.   CHA2DS2/VAS Stroke Risk Points  Current as of 11 minutes ago     6 >= 2 Points: High Risk  1 - 1.99 Points: Medium Risk  0 Points: Low Risk    This is the only CHA2DS2/VAS Stroke Risk Points available for the past  year.:  Last Change: N/A     Details    This score determines the patient's risk of having a stroke if the  patient has atrial fibrillation.       Points Metrics  1 Has Congestive Heart Failure:  Yes    Current as of 11 minutes ago  1 Has Vascular Disease:  Yes     Current as of 11 minutes ago  1 Has Hypertension:  Yes    Current as of 11 minutes ago  2 Age:  53    Current as of 11 minutes ago  0 Has Diabetes:  No    Current as of 11 minutes ago  0 Had Stroke:  No  Had TIA:  No  Had thromboembolism:  No    Current as of 11 minutes ago  1 Female:  Yes    Current as of 11 minutes ago     She has had a colonoscopy in [place] about 5 years ago.  No GI doctor in [place].  She thinks it was nl.           Past Medical History:  Diagnosis Date  . Atrial fibrillation ([place])   . CARDIAC ARRHYTHMIA 01/08/2010  . COLONIC POLYPS, HX OF 10/09/2006  . COPD  (chronic obstructive pulmonary disease) ([place])   . HYPERLIPIDEMIA 10/09/2006  . HYPERTENSION 10/09/2006  . HYPOTHYROIDISM 10/09/2006  . LASSITUDE 08/15/2009  . OSTEOPOROSIS 10/09/2006  . VERTIGO 08/07/2009  . Wears glasses   . Wears hearing aid    left    Patient Active Problem List   Diagnosis Date Noted  . Acute on chronic diastolic heart failure ([place]) 03/18/2016  . CHF exacerbation ([place]) 03/02/2016  . COPD with acute exacerbation ([place]) 03/02/2016  . Hypertensive urgency 03/02/2016  . Pleural effusion 03/02/2016  . SSS (sick sinus syndrome) ([place]) 03/02/2016  . CHF (congestive heart failure) ([place]) 03/02/2016  . Chronic anticoagulation 08/03/2013  . Atrial fibrillation ([place]) 08/02/2013  . Lobular carcinoma in situ of left breast 06/03/2012  . LASSITUDE 08/15/2009  . VERTIGO 08/07/2009  . Hypothyroidism 10/09/2006  . Dyslipidemia 10/09/2006  . Essential hypertension 10/09/2006  . Osteoporosis 10/09/2006  . History of  colonic polyps 10/09/2006    Past Surgical History:  Procedure Laterality Date  . ABDOMINAL HYSTERECTOMY    . APPENDECTOMY    . BREAST SURGERY     left - lumpectomy  . CARDIOVERSION N/A 08/09/2013   Procedure: CARDIOVERSION;  Surgeon: Candee Furbish, MD;  Location: [facility] ENDOSCOPY;  Service: Cardiovascular;  Laterality: N/A;  . CATARACT EXTRACTION    . CHOLECYSTECTOMY    . PACEMAKER PLACEMENT N/A 12/05/2013  . PARTIAL MASTECTOMY WITH NEEDLE LOCALIZATION Left 06/29/2012   Procedure: PARTIAL MASTECTOMY WITH NEEDLE LOCALIZATION;  Surgeon: Adin Hector, MD;  Location: [facility];  Service: General;  Laterality: Left;  . TONSILLECTOMY    . YAG LASER APPLICATION       OB History   None      Home Medications    Prior to Admission medications   Medication Sig Start Date End Date Taking? Authorizing Provider  amiodarone (PACERONE) 200 MG tablet TAKE 1 TABLET BY MOUTH TWICE DAILY 03/09/17  Yes Troy Sine, MD  calcium-vitamin D (OSCAL WITH D)  500-200 MG-UNIT per tablet Take 1 tablet by mouth 2 (two) times daily.    Yes [provider]  cholecalciferol (VITAMIN D) 1000 units tablet Take 1,000 Units by mouth daily.   Yes [provider]  furosemide (LASIX) 20 MG tablet Take 1 tablet (20 mg total) by mouth daily. 08/12/16  Yes Marletta Lor, MD  HYDROcodone-acetaminophen (HYCET) 7.5-325 mg/15 ml solution Take 5 mLs by mouth every 6 (six) hours as needed for moderate pain or severe pain. 06/18/17  Yes Marletta Lor, MD  losartan (COZAAR) 100 MG tablet TAKE 1 TABLET BY MOUTH ONCE DAILY 03/16/17  Yes Marletta Lor, MD  metoprolol succinate (TOPROL-XL) 50 MG 24 hr tablet TAKE 1 TABLET BY MOUTH TWICE DAILY 04/20/17  Yes Troy Sine, MD  Multiple Vitamin (MULTIVITAMIN) tablet Take 1 tablet by mouth daily.   Yes [provider]  ondansetron (ZOFRAN) 4 MG tablet Take 1 tablet (4 mg total) by mouth every 8 (eight) hours as needed for nausea or vomiting. 06/18/17  Yes Marletta Lor, MD  spironolactone (ALDACTONE) 25 MG tablet Take 12.5 mg by mouth daily. 01/14/17  Yes [provider]  SYNTHROID 75 MCG tablet TAKE 1 TABLET BY MOUTH ONCE DAILY 02/16/17  Yes Marletta Lor, MD  vitamin C (ASCORBIC ACID) 500 MG tablet Take 500 mg by mouth daily.   Yes [provider]  vitamin E 100 UNIT capsule Take 100 Units by mouth daily.   Yes [provider]  XARELTO 15 MG TABS tablet TAKE 1 TABLET BY MOUTH ONCE DAILY WITH SUPPER 06/18/17  Yes Marletta Lor, MD  acetaminophen (TYLENOL) 500 MG tablet Take 500 mg by mouth every 6 (six) hours as needed for moderate pain or headache.    [provider]  amiodarone (PACERONE) 200 MG tablet Take 1.5 tablets (300 mg total) by mouth daily. Patient not taking: Reported on 06/10/2017 08/04/16   Troy Sine, MD  diclofenac (FLECTOR) 1.3 % PTCH Place 1 patch onto the skin 2 (two) times daily. Patient not taking: Reported on  06/10/2017 05/14/17   Carmin Muskrat, MD  diclofenac sodium (VOLTAREN) 1 % GEL Apply 4 g topically 4 (four) times daily. Patient not taking: Reported on 06/10/2017 05/03/17   Blanchie Dessert, MD  ipratropium (ATROVENT HFA) 17 MCG/ACT inhaler Inhale 2 puffs into the lungs every 4 (four) hours as needed for wheezing. 03/18/16  Marletta Lor, MD  lidocaine (LIDODERM) 5 % Place 1 patch onto the skin daily. Remove & Discard patch within 12 hours or as directed by MD Patient not taking: Reported on 06/10/2017 05/03/17   Blanchie Dessert, MD  lidocaine (LIDODERM) 5 % Place 1 patch onto the skin daily. Remove & Discard patch within 12 hours or as directed by MD Patient not taking: Reported on 06/10/2017 05/11/17   Marletta Lor, MD  traMADol (ULTRAM) 50 MG tablet Take 1 tablet (50 mg total) by mouth 2 (two) times daily as needed (pain). Patient not taking: Reported on 06/10/2017 05/28/17   Marletta Lor, MD  zolpidem (AMBIEN) 5 MG tablet TAKE 1 TABLET BY MOUTH AT BEDTIME Patient not taking: Reported on 06/10/2017 05/13/17   Marletta Lor, MD    Family History Family History  Problem Relation Age of Onset  . Cancer Mother   . Heart failure Father   . Cancer Sister   . Stroke Sister     Social History Social History   Tobacco Use  . Smoking status: Never Smoker  . Smokeless tobacco: Never Used  Substance Use Topics  . Alcohol use: No  . Drug use: No     Allergies   Codeine and Tramadol   Review of Systems Review of Systems  Gastrointestinal: Positive for constipation and nausea.  Musculoskeletal: Positive for back pain.  Neurological: Positive for syncope.  All other systems reviewed and are negative.    Physical Exam Updated Vital Signs BP 126/64 (BP Location: Right Arm)   Pulse 70   Resp 20   Ht 5\' 1"  (1.549 m)   Wt 59 kg (130 lb)   SpO2 100%   BMI 24.56 kg/m   Physical Exam  Constitutional: She is oriented to person, place, and time. She  appears well-developed and well-nourished.  HENT:  Head: Normocephalic and atraumatic.  Right Ear: External ear normal.  Left Ear: External ear normal.  Nose: Nose normal.  Mouth/Throat: Oropharynx is clear and moist.  Pressure sore right ear   Eyes: Pupils are equal, round, and reactive to light. Conjunctivae and EOM are normal.  Neck: Normal range of motion. Neck supple.  Cardiovascular: Normal rate, regular rhythm, normal heart sounds and intact distal pulses.  Pulmonary/Chest: Effort normal and breath sounds normal.  Abdominal: Soft. Bowel sounds are normal.  Genitourinary: Rectal exam shows guaiac positive stool. Rectal exam shows no external hemorrhoid.  Musculoskeletal:       Lumbar back: She exhibits tenderness.  Neurological: She is alert and oriented to person, place, and time.  Skin: Skin is warm. Capillary refill takes less than 2 seconds.  Psychiatric: She has a normal mood and affect. Her behavior is normal. Judgment and thought content normal.  Nursing note and vitals reviewed.    ED Treatments / Results  Labs (all labs ordered are listed, but only abnormal results are displayed) Labs Reviewed  CBC WITH DIFFERENTIAL/PLATELET - Abnormal; Notable for the following components:      Result Value   RBC 3.71 (*)    MCV 102.2 (*)    RDW 16.8 (*)    Platelets 121 (*)    Neutro Abs 8.3 (*)    Lymphs Abs 0.3 (*)    All other components within normal limits  COMPREHENSIVE METABOLIC PANEL - Abnormal; Notable for the following components:   Glucose, Bld 145 (*)    BUN 33 (*)    Creatinine, Ser 1.83 (*)    Albumin 2.9 (*)  AST 360 (*)    ALT 326 (*)    Alkaline Phosphatase 185 (*)    Total Bilirubin 1.6 (*)    GFR calc non Af Amer 23 (*)    GFR calc Af Amer 27 (*)    All other components within normal limits  TROPONIN I - Abnormal; Notable for the following components:   Troponin I 0.05 (*)    All other components within normal limits  CBG MONITORING, ED -  Abnormal; Notable for the following components:   Glucose-Capillary 135 (*)    All other components within normal limits  POC OCCULT BLOOD, ED - Abnormal; Notable for the following components:   Fecal Occult Bld POSITIVE (*)    All other components within normal limits  PROTIME-INR  URINALYSIS, ROUTINE W REFLEX MICROSCOPIC  TYPE AND SCREEN    EKG EKG Interpretation  Date/Time:  Tuesday June 23 2017 17:13:16 EDT Ventricular Rate:  70 PR Interval:    QRS Duration: 107 QT Interval:  470 QTC Calculation: 508 R Axis:   8 Text Interpretation:  Atrial-paced rhythm Probable left ventricular hypertrophy Prolonged QT interval No significant change since last tracing Confirmed by Isla Pence [phone]) on 07/05/2017 5:28:57 PM   Radiology Dg Lumbar Spine Complete  Result Date: 06/17/2017 CLINICAL DATA:  Low back pain. Right flank pain. Syncopal episode. Initial encounter. EXAM: LUMBAR SPINE - COMPLETE 4+ VIEW COMPARISON:  05/14/2017 FINDINGS: There is transitional lumbosacral anatomy, and the transitional segment will be considered a partially lumbarized S1 for consistency with the prior study. The bones are diffusely osteopenic. An L3 compression fracture is again seen with similar severe vertebral body height loss. A mild T12 compression fracture is less well profiled on today's lateral radiograph than on the prior study but is without grossly progressive vertebral body height loss. No new compression fracture is identified. Mild upper lumbar levoscoliosis is noted. No significant listhesis is seen. Disc space narrowing at L2-3 is similar to the prior study. Mild lower lumbar facet arthrosis is noted. There is aortic atherosclerosis. IMPRESSION: Chronic lumbar and lower thoracic compression fractures without evidence of acute osseous abnormality. Electronically Signed   By: Logan Bores M.D.   On: 07/05/2017 18:39   Dg Abdomen Acute W/chest  Result Date: 06/29/2017 CLINICAL DATA:  84 year old  female status post syncopal episode while using the bathroom. Lower abdominal pain. EXAM: DG ABDOMEN ACUTE W/ 1V CHEST COMPARISON:  Chest radiographs 06/10/2017 and earlier. Lumbar radiographs 05/14/2017. FINDINGS: Semi upright AP view of the chest. Stable cardiomegaly and mediastinal contours. Stable left chest cardiac pacemaker. Calcified aortic atherosclerosis. Stable lung volumes. No pneumothorax, pulmonary edema or acute pulmonary opacity. Chronic blunting of the right costophrenic angle. No pneumoperitoneum. Non obstructed bowel gas pattern. Left-side-down lateral decubitus view of the abdomen also demonstrates no free air. Stable visualized osseous structures, including osteopenia and L3 compression fracture. A round 7-8 millimeter calcification projects to the right of the mid lumbar spine and is stable since February. IMPRESSION: 1.  No acute cardiopulmonary abnormality. 2. Normal bowel gas pattern, no free air. 3. A 7-8 mm right abdominal calcification is stable since February and of uncertain etiology and significance. Correlate for hematuria. Electronically Signed   By: Genevie Ann M.D.   On: 06/26/2017 18:36    Procedures Procedures (including critical care time)  Medications Ordered in ED Medications  sodium chloride 0.9 % bolus 1,000 mL (0 mLs Intravenous Stopped 06/20/2017 1853)    And  0.9 %  sodium chloride infusion ( Intravenous  New Bag/Given 07/08/2017 1801)  pantoprazole (PROTONIX) injection 40 mg (has no administration in time range)  ondansetron (ZOFRAN) injection 4 mg (4 mg Intravenous Given 07/10/2017 1748)  ketorolac (TORADOL) 15 MG/ML injection 15 mg (15 mg Intravenous Given 06/25/2017 1748)  sodium chloride 0.9 % bolus 1,000 mL (0 mLs Intravenous Stopped 06/24/2017 2024)     Initial Impression / Assessment and Plan / ED Course  I have reviewed the triage vital signs and the nursing notes.  Pertinent labs & imaging results that were available during my care of the patient were reviewed by me  and considered in my medical decision making (see chart for details).  CRITICAL CARE Performed by: Isla Pence   Total critical care time: 30 minutes  Critical care time was exclusive of separately billable procedures and treating other patients.  Critical care was necessary to treat or prevent imminent or life-threatening deterioration.  Critical care was time spent personally by me on the following activities: development of treatment plan with patient and/or surrogate as well as nursing, discussions with consultants, evaluation of patient's response to treatment, examination of patient, obtaining history from patient or surrogate, ordering and performing treatments and interventions, ordering and review of laboratory studies, ordering and review of radiographic studies, pulse oximetry and re-evaluation of patient's condition.    CKD and elevated troponins appear chronic.  LFT elevation is chronic.  PCP did a RUQ [place] on 3/15 because of elevated LFTs and this showed:  IMPRESSION: 1. Gallbladder absent. Common bile duct measures 9 mm which may be within normal limits for post cholecystectomy state. No biliary duct mass or calculus evident.  2. Increase in liver echogenicity, a finding most likely indicative of a degree of hepatic steatosis. While no focal liver lesions are evident, it must be cautioned that the sensitivity of ultrasound for detection of focal liver lesions is diminished in this circumstance.  3.  Trace ascites.  4. Increase in right renal echogenicity, a finding that may be associated with medical renal disease. Appropriate renal laboratory studies advised.  BP low upon arrival, but resolved with IVFs.  Syncope likely from orthostatic hypotension.  May need medication adjustment.  Pt's stool is also guaiac +, but h/h nl.  She has been taking a lot of nsaids for her back pain.  This will also need to be evaluated.  Pt also has not been moving much since she  had the compression fx.  She will need PT eval.  Pt d/w Dr. Laren Everts ([facility]) for admission.  Final Clinical Impressions(s) / ED Diagnoses   Final diagnoses:  Syncope, unspecified syncope type  Ambulatory dysfunction  Closed compression fracture of L3 lumbar vertebra, initial encounter (HCC)  T12 compression fracture (HCC)  Guaiac positive stools  CKD (chronic kidney disease) stage 4, GFR 15-29 ml/min (HCC)  Elevated LFTs    ED Discharge Orders    None       Isla Pence, MD 06/26/17 1519

## 2017-06-23 NOTE — H&P (Addendum)
[facility]                                                                                    Patient Demographics  Christine Perez, is a 84 y.o. female  CSN: [id number]  MRN: [id number]  DOB - 1925-06-04  Admit Date - 07/05/2017  Outpatient Primary MD for the patient is Marletta Lor, MD   With History of -  Past Medical History:  Diagnosis Date  . Atrial fibrillation ([place])   . CARDIAC ARRHYTHMIA 01/08/2010  . COLONIC POLYPS, HX OF 10/09/2006  . COPD (chronic obstructive pulmonary disease) ([place])   . HYPERLIPIDEMIA 10/09/2006  . HYPERTENSION 10/09/2006  . HYPOTHYROIDISM 10/09/2006  . LASSITUDE 08/15/2009  . OSTEOPOROSIS 10/09/2006  . VERTIGO 08/07/2009  . Wears glasses   . Wears hearing aid    left      Past Surgical History:  Procedure Laterality Date  . ABDOMINAL HYSTERECTOMY    . APPENDECTOMY    . BREAST SURGERY     left - lumpectomy  . CARDIOVERSION N/A 08/09/2013   Procedure: CARDIOVERSION;  Surgeon: Candee Furbish, MD;  Location: [facility] ENDOSCOPY;  Service: Cardiovascular;  Laterality: N/A;  . CATARACT EXTRACTION    . CHOLECYSTECTOMY    . PACEMAKER PLACEMENT N/A 12/05/2013  . PARTIAL MASTECTOMY WITH NEEDLE LOCALIZATION Left 06/29/2012   Procedure: PARTIAL MASTECTOMY WITH NEEDLE LOCALIZATION;  Surgeon: Adin Hector, MD;  Location: [facility];  Service: General;  Laterality: Left;  . TONSILLECTOMY    . YAG LASER APPLICATION      in for   Chief Complaint  Patient presents with  . Loss of Consciousness     HPI  Christine Perez  is a 84 y.o. female, with past medical history significant for arrhythmias on amiodarone, Xarelto, and a pacemaker, history of hypertension and history of fall in February with vertebral fracture on nonsteroidals and hydrocodone for pain control.  According to the daughter the patient stopped nonsteroidals fairly long time ago.  Today the patient had a syncopal episode while sitting in the bathroom and the  daughter noticed blood in the commode.  Workup in the ER did not show any melena or bright red blood per rectum but the stool was guaiac positive.  She was hypotensive in the emergency room in the 96E systolic but responded to fluid resuscitation and her systolic blood pressure now is 117 .  Her troponin was borderline elevated but has a history of elevated troponin.  Patient denies any chest pains, palpitations, shortness of breath, nausea or vomiting.  Her hemoglobin was 12.2.  The patient's main concern is the pain in her back which is preventing her from moving around.  The daughter expressed concern that she is not able to ambulate at all due to this pain    Review of Systems    In addition to the HPI above,  No Fever-chills, No Headache, No changes with Vision or hearing, No problems swallowing food or Liquids, No Chest pain, Cough or Shortness of Breath, No Abdominal pain, No Nausea or Vommitting, Bowel movements are regular, No Blood in stool or Urine, No dysuria, No new skin rashes  or bruises, No new joints pains-aches,  No new weakness, tingling, numbness in any extremity, No recent weight gain or loss, No polyuria, polydypsia or polyphagia, No significant Mental Stressors.  A full 10 point Review of Systems was done, except as stated above, all other Review of Systems were negative.   Social History Social History   Tobacco Use  . Smoking status: Never Smoker  . Smokeless tobacco: Never Used  Substance Use Topics  . Alcohol use: No     Family History Family History  Problem Relation Age of Onset  . Cancer Mother   . Heart failure Father   . Cancer Sister   . Stroke Sister      Prior to Admission medications   Medication Sig Start Date End Date Taking? Authorizing Provider  amiodarone (PACERONE) 200 MG tablet TAKE 1 TABLET BY MOUTH TWICE DAILY 03/09/17  Yes Troy Sine, MD  calcium-vitamin D (OSCAL WITH D) 500-200 MG-UNIT per tablet Take 1 tablet by mouth  2 (two) times daily.    Yes [provider]  cholecalciferol (VITAMIN D) 1000 units tablet Take 1,000 Units by mouth daily.   Yes [provider]  furosemide (LASIX) 20 MG tablet Take 1 tablet (20 mg total) by mouth daily. 08/12/16  Yes Marletta Lor, MD  HYDROcodone-acetaminophen (HYCET) 7.5-325 mg/15 ml solution Take 5 mLs by mouth every 6 (six) hours as needed for moderate pain or severe pain. 06/18/17  Yes Marletta Lor, MD  losartan (COZAAR) 100 MG tablet TAKE 1 TABLET BY MOUTH ONCE DAILY 03/16/17  Yes Marletta Lor, MD  metoprolol succinate (TOPROL-XL) 50 MG 24 hr tablet TAKE 1 TABLET BY MOUTH TWICE DAILY 04/20/17  Yes Troy Sine, MD  Multiple Vitamin (MULTIVITAMIN) tablet Take 1 tablet by mouth daily.   Yes [provider]  ondansetron (ZOFRAN) 4 MG tablet Take 1 tablet (4 mg total) by mouth every 8 (eight) hours as needed for nausea or vomiting. 06/18/17  Yes Marletta Lor, MD  spironolactone (ALDACTONE) 25 MG tablet Take 12.5 mg by mouth daily. 01/14/17  Yes [provider]  SYNTHROID 75 MCG tablet TAKE 1 TABLET BY MOUTH ONCE DAILY 02/16/17  Yes Marletta Lor, MD  vitamin C (ASCORBIC ACID) 500 MG tablet Take 500 mg by mouth daily.   Yes [provider]  vitamin E 100 UNIT capsule Take 100 Units by mouth daily.   Yes [provider]  XARELTO 15 MG TABS tablet TAKE 1 TABLET BY MOUTH ONCE DAILY WITH SUPPER 06/18/17  Yes Marletta Lor, MD  acetaminophen (TYLENOL) 500 MG tablet Take 500 mg by mouth every 6 (six) hours as needed for moderate pain or headache.    [provider]  amiodarone (PACERONE) 200 MG tablet Take 1.5 tablets (300 mg total) by mouth daily. Patient not taking: Reported on 06/10/2017 08/04/16   Troy Sine, MD  diclofenac (FLECTOR) 1.3 % PTCH Place 1 patch onto the skin 2 (two) times daily. Patient not taking: Reported on 06/10/2017 05/14/17   Carmin Muskrat, MD  diclofenac  sodium (VOLTAREN) 1 % GEL Apply 4 g topically 4 (four) times daily. Patient not taking: Reported on 06/10/2017 05/03/17   Blanchie Dessert, MD  ipratropium (ATROVENT HFA) 17 MCG/ACT inhaler Inhale 2 puffs into the lungs every 4 (four) hours as needed for wheezing. 03/18/16   Marletta Lor, MD  lidocaine (LIDODERM) 5 % Place 1 patch onto the skin daily. Remove & Discard patch within  12 hours or as directed by MD Patient not taking: Reported on 06/10/2017 05/03/17   Blanchie Dessert, MD  lidocaine (LIDODERM) 5 % Place 1 patch onto the skin daily. Remove & Discard patch within 12 hours or as directed by MD Patient not taking: Reported on 06/10/2017 05/11/17   Marletta Lor, MD  traMADol (ULTRAM) 50 MG tablet Take 1 tablet (50 mg total) by mouth 2 (two) times daily as needed (pain). Patient not taking: Reported on 06/10/2017 05/28/17   Marletta Lor, MD  zolpidem (AMBIEN) 5 MG tablet TAKE 1 TABLET BY MOUTH AT BEDTIME Patient not taking: Reported on 06/10/2017 05/13/17   Marletta Lor, MD    Allergies  Allergen Reactions  . Codeine Shortness Of Breath    Difficulty breathing  . Tramadol Other (See Comments)    Hallucinations and "my feet jerk uncontrollably"     Physical Exam  Vitals  Blood pressure 126/64, pulse 70, resp. rate 20, height 5\' 1"  (1.549 m), weight 59 kg (130 lb), SpO2 100 %.   1. General well-developed, well-nourished female extremely pleasant, in pain  2. Normal affect and insight, Not Suicidal or Homicidal, Awake Alert, Oriented X 3.  3. No F.N deficits, patient moving all extremities.  4. Ears and Eyes appear Normal, Conjunctivae clear,  Moist Oral Mucosa.  5. Supple Neck, No JVD, No cervical lymphadenopathy appriciated, No Carotid Bruits.  6. Symmetrical Chest wall movement, Good air movement bilaterally, CTAB.  7. RRR, No Gallops, Rubs or Murmurs, No Parasternal Heave.  8. Positive Bowel Sounds, Abdomen Soft, Non tender, No organomegaly  appriciated,No rebound -guarding or rigidity.  9.  No Cyanosis, Normal Skin Turgor, No Skin Rash or Bruise.  10. Good muscle tone,  joints appear normal , no effusions, Normal ROM.    Data Review  CBC Recent Labs  Lab 07/01/2017 1735  WBC 9.3  HGB 12.2  HCT 37.9  PLT 121*  MCV 102.2*  MCH 32.9  MCHC 32.2  RDW 16.8*  LYMPHSABS 0.3*  MONOABS 0.7  EOSABS 0.0  BASOSABS 0.0   ------------------------------------------------------------------------------------------------------------------  Chemistries  Recent Labs  Lab 06/17/2017 1735  NA 140  K 3.9  CL 102  CO2 26  GLUCOSE 145*  BUN 33*  CREATININE 1.83*  CALCIUM 10.2  AST 360*  ALT 326*  ALKPHOS 185*  BILITOT 1.6*   ------------------------------------------------------------------------------------------------------------------ estimated creatinine clearance is 17.2 mL/min (A) (by C-G formula based on SCr of 1.83 mg/dL (H)). ------------------------------------------------------------------------------------------------------------------ No results for input(s): TSH, T4TOTAL, T3FREE, THYROIDAB in the last 72 hours.  Invalid input(s): FREET3   Coagulation profile Recent Labs  Lab 06/19/2017 1735  INR 1.04   ------------------------------------------------------------------------------------------------------------------- No results for input(s): DDIMER in the last 72 hours. -------------------------------------------------------------------------------------------------------------------  Cardiac Enzymes Recent Labs  Lab 06/16/2017 1735  TROPONINI 0.05*   ------------------------------------------------------------------------------------------------------------------ Invalid input(s): POCBNP   ---------------------------------------------------------------------------------------------------------------  Urinalysis    Component Value Date/Time   COLORURINE YELLOW 07/03/2017 2030   APPEARANCEUR  HAZY (A) 07/12/2017 2030   APPEARANCEUR Clear 08/04/2016 1553   LABSPEC 1.011 07/07/2017 2030   PHURINE 5.0 07/02/2017 2030   GLUCOSEU NEGATIVE 07/09/2017 2030   [place] NEGATIVE 07/08/2017 2030   [place] NEGATIVE 07/11/2017 2030   BILIRUBINUR Negative 08/04/2016 [place] 06/16/2017 2030   PROTEINUR NEGATIVE 06/18/2017 2030   UROBILINOGEN 0.2 09/14/2013 1545   NITRITE NEGATIVE 07/01/2017 2030   LEUKOCYTESUR NEGATIVE 06/29/2017 2030   LEUKOCYTESUR Negative 08/04/2016 1553    ----------------------------------------------------------------------------------------------------------------     Imaging results:  Dg Chest 2 View  Result Date: 06/10/2017 CLINICAL DATA:  Weakness EXAM: CHEST - 2 VIEW COMPARISON:  05/03/2017, 08/01/2016 FINDINGS: Trace pleural effusion. No focal consolidation. Mild cardiomegaly with aortic atherosclerosis. Left-sided pacing device. Kyphosis of the spine with marked compression fracture of lower thoracic vertebra and upper lumbar vertebra. IMPRESSION: 1. Mild cardiomegaly with trace pleural effusion or thickening. No focal opacity 2. Marked compression fracture deformities at the lower thoracic and upper lumbar spine Electronically Signed   By: Donavan Foil M.D.   On: 06/10/2017 21:53   Dg Lumbar Spine Complete  Result Date: 06/22/2017 CLINICAL DATA:  Low back pain. Right flank pain. Syncopal episode. Initial encounter. EXAM: LUMBAR SPINE - COMPLETE 4+ VIEW COMPARISON:  05/14/2017 FINDINGS: There is transitional lumbosacral anatomy, and the transitional segment will be considered a partially lumbarized S1 for consistency with the prior study. The bones are diffusely osteopenic. An L3 compression fracture is again seen with similar severe vertebral body height loss. A mild T12 compression fracture is less well profiled on today's lateral radiograph than on the prior study but is without grossly progressive vertebral body height loss. No new  compression fracture is identified. Mild upper lumbar levoscoliosis is noted. No significant listhesis is seen. Disc space narrowing at L2-3 is similar to the prior study. Mild lower lumbar facet arthrosis is noted. There is aortic atherosclerosis. IMPRESSION: Chronic lumbar and lower thoracic compression fractures without evidence of acute osseous abnormality. Electronically Signed   By: Logan Bores M.D.   On: 07/11/2017 18:39   Ct Head Wo Contrast  Result Date: 06/10/2017 CLINICAL DATA:  Generalized weakness beginning last night, nausea and vomiting. History of atrial fibrillation, hypertension and hyperlipidemia. EXAM: CT HEAD WITHOUT CONTRAST TECHNIQUE: Contiguous axial images were obtained from the base of the skull through the vertex without intravenous contrast. COMPARISON:  CT HEAD May 03, 2017 FINDINGS: BRAIN: No intraparenchymal hemorrhage, mass effect nor midline shift. Old mesial RIGHT occipital lobe encephalomalacia. Old bilateral basal ganglia lacunar infarcts. Patchy to confluent supratentorial white matter hypodensities. No abnormal extra-axial fluid collections. Basal cisterns are patent. VASCULAR: Mild calcific atherosclerosis of the carotid siphons. SKULL: No skull fracture. No significant scalp soft tissue swelling. SINUSES/ORBITS: The mastoid air-cells and included paranasal sinuses are well-aerated.The included ocular globes and orbital contents are non-suspicious. Status post bilateral ocular lens implants. OTHER: None. IMPRESSION: 1. No acute intracranial process. 2. Stable examination including moderate chronic small vessel ischemic disease and old RIGHT occipital lobe/PCA territory infarct. Electronically Signed   By: Elon Alas M.D.   On: 06/10/2017 22:06   Dg Abdomen Acute W/chest  Result Date: 07/10/2017 CLINICAL DATA:  84 year old female status post syncopal episode while using the bathroom. Lower abdominal pain. EXAM: DG ABDOMEN ACUTE W/ 1V CHEST COMPARISON:  Chest  radiographs 06/10/2017 and earlier. Lumbar radiographs 05/14/2017. FINDINGS: Semi upright AP view of the chest. Stable cardiomegaly and mediastinal contours. Stable left chest cardiac pacemaker. Calcified aortic atherosclerosis. Stable lung volumes. No pneumothorax, pulmonary edema or acute pulmonary opacity. Chronic blunting of the right costophrenic angle. No pneumoperitoneum. Non obstructed bowel gas pattern. Left-side-down lateral decubitus view of the abdomen also demonstrates no free air. Stable visualized osseous structures, including osteopenia and L3 compression fracture. A round 7-8 millimeter calcification projects to the right of the mid lumbar spine and is stable since February. IMPRESSION: 1.  No acute cardiopulmonary abnormality. 2. Normal bowel gas pattern, no free air. 3. A 7-8 mm right abdominal calcification is stable since February and of uncertain etiology  and significance. Correlate for hematuria. Electronically Signed   By: Genevie Ann M.D.   On: 07/14/2017 18:36   US Abdomen Limited Ruq  Result Date: 05/29/2017 CLINICAL DATA:  Elevated liver enzymes EXAM: ULTRASOUND ABDOMEN LIMITED RIGHT UPPER QUADRANT COMPARISON:  None. FINDINGS: Gallbladder: Surgically absent. Common bile duct: Diameter: 9 mm. No intrahepatic biliary duct dilatation. No biliary duct mass or calculus. Liver: No focal lesion identified. Liver echogenicity overall is increased. Portal vein is patent on color Doppler imaging with normal direction of blood flow towards the liver. There is trace fluid in the hepatorenal fossa on the right. Right kidney appears echogenic. IMPRESSION: 1. Gallbladder absent. Common bile duct measures 9 mm which may be within normal limits for post cholecystectomy state. No biliary duct mass or calculus evident. 2. Increase in liver echogenicity, a finding most likely indicative of a degree of hepatic steatosis. While no focal liver lesions are evident, it must be cautioned that the sensitivity of  ultrasound for detection of focal liver lesions is diminished in this circumstance. 3.  Trace ascites. 4. Increase in right renal echogenicity, a finding that may be associated with medical renal disease. Appropriate renal laboratory studies advised. Electronically Signed   By: Lowella Grip III M.D.   On: 05/29/2017 15:41    My personal review of EKG: Atrial paced rhythm at a rate of 70 with possible LVH  Assessment & Plan  1.  Syncope     Patient has history of arrhythmias.  Blood pressure was decreased not sure if due to vasovagal or GI bleed or arrhythmias 2.  GI bleed , heme positive stools on Xarelto hemoglobin 12.2  ? NSAID     Monitor hemoglobin?  Hemoconcentration , transfuse PRN     IV Protonix     Consider GI consult in a.m.     Hold Xarelto 3.  Lower back pain with vertebral fractures    PT to evaluate and consider orthopedic consult for possible steroid injection    Continue with lidocaine patches and hydrocodone 4.  Hypertension ,     Monitor and try to change her regimen accordingly.  Her cardiologist is Dr. Claiborne Billings. 5.  Renal insufficiency      IV fluids, monitor creatinine   DVT Prophylaxis SCDs  AM Labs Ordered, also please review Full Orders  Family Communication: Admission, patients condition and plan of care including tests being ordered have been discussed with the patient and daughter who indicate understanding and agree with the plan and Code Status.  Code Status full  Disposition Plan: Home with home health  Time spent in minutes : 44 minutes  Condition GUARDED   @SIGNATURE @

## 2017-06-24 ENCOUNTER — Ambulatory Visit: Payer: Medicare Other | Admitting: Internal Medicine

## 2017-06-24 DIAGNOSIS — K625 Hemorrhage of anus and rectum: Secondary | ICD-10-CM

## 2017-06-24 DIAGNOSIS — R710 Precipitous drop in hematocrit: Secondary | ICD-10-CM

## 2017-06-24 LAB — BASIC METABOLIC PANEL
Anion gap: 10 (ref 5–15)
BUN: 34 mg/dL — AB (ref 6–20)
CALCIUM: 8.8 mg/dL — AB (ref 8.9–10.3)
CHLORIDE: 109 mmol/L (ref 101–111)
CO2: 23 mmol/L (ref 22–32)
CREATININE: 1.98 mg/dL — AB (ref 0.44–1.00)
GFR calc non Af Amer: 21 mL/min — ABNORMAL LOW (ref >60)
GFR, EST AFRICAN AMERICAN: 25 mL/min — AB (ref >60)
GLUCOSE: 93 mg/dL (ref 65–99)
Potassium: 3.5 mmol/L (ref 3.5–5.1)
Sodium: 142 mmol/L (ref 135–145)

## 2017-06-24 LAB — CBC
HEMATOCRIT: 29.5 % — AB (ref 36.0–46.0)
Hemoglobin: 9.4 g/dL — ABNORMAL LOW (ref 12.0–15.0)
MCH: 32.9 pg (ref 26.0–34.0)
MCHC: 31.9 g/dL (ref 30.0–36.0)
MCV: 103.1 fL — AB (ref 78.0–100.0)
PLATELETS: 83 10*3/uL — AB (ref 150–400)
RBC: 2.86 MIL/uL — AB (ref 3.87–5.11)
RDW: 16.9 % — ABNORMAL HIGH (ref 11.5–15.5)
WBC: 8.9 10*3/uL (ref 4.0–10.5)

## 2017-06-24 LAB — TROPONIN I
TROPONIN I: 0.04 ng/mL — AB (ref <0.03)
Troponin I: 0.04 ng/mL (ref <0.03)
Troponin I: 0.05 ng/mL (ref <0.03)

## 2017-06-24 LAB — HEMOGLOBIN
Hemoglobin: 10.6 g/dL — ABNORMAL LOW (ref 12.0–15.0)
Hemoglobin: 10 g/dL — ABNORMAL LOW (ref 12.0–15.0)

## 2017-06-24 LAB — HEMOGLOBIN AND HEMATOCRIT, BLOOD
HCT: 31.3 % — ABNORMAL LOW (ref 36.0–46.0)
Hemoglobin: 9.9 g/dL — ABNORMAL LOW (ref 12.0–15.0)

## 2017-06-24 LAB — ABO/RH: ABO/RH(D): O NEG

## 2017-06-24 LAB — PREPARE RBC (CROSSMATCH)

## 2017-06-24 MED ORDER — SODIUM CHLORIDE 0.9 % IV SOLN: Freq: Once | INTRAVENOUS | Status: DC

## 2017-06-24 MED ORDER — ORAL CARE MOUTH RINSE
15.0000 mL | Freq: Two times a day (BID) | OROMUCOSAL | Status: DC
  Administered 2017-06-24 – 2017-06-30 (×14): 15 mL via OROMUCOSAL

## 2017-06-24 MED ORDER — ALBUTEROL SULFATE (2.5 MG/3ML) 0.083% IN NEBU: 2.5000 mg | INHALATION_SOLUTION | RESPIRATORY_TRACT | Status: DC | PRN

## 2017-06-24 MED ORDER — ACETAMINOPHEN 325 MG PO TABS: 650.0000 mg | ORAL_TABLET | Freq: Four times a day (QID) | ORAL | Status: DC | PRN

## 2017-06-24 MED ORDER — CHLORHEXIDINE GLUCONATE 0.12 % MT SOLN
15.0000 mL | Freq: Two times a day (BID) | OROMUCOSAL | Status: DC
  Administered 2017-06-24 – 2017-06-30 (×9): 15 mL via OROMUCOSAL
  Filled 2017-06-24 (×8): qty 15

## 2017-06-24 MED ORDER — AMIODARONE HCL 200 MG PO TABS
200.0000 mg | ORAL_TABLET | Freq: Two times a day (BID) | ORAL | Status: DC
  Administered 2017-06-24 – 2017-06-27 (×7): 200 mg via ORAL
  Filled 2017-06-24 (×7): qty 1

## 2017-06-24 MED ORDER — HYDROCODONE-ACETAMINOPHEN 5-325 MG PO TABS
1.0000 | ORAL_TABLET | ORAL | Status: DC | PRN
  Administered 2017-06-24 (×3): 2 via ORAL
  Administered 2017-06-25: 1 via ORAL
  Administered 2017-06-26: 2 via ORAL
  Filled 2017-06-24 (×3): qty 2
  Filled 2017-06-24: qty 1
  Filled 2017-06-24: qty 2

## 2017-06-24 MED ORDER — ACETAMINOPHEN 650 MG RE SUPP: 650.0000 mg | Freq: Four times a day (QID) | RECTAL | Status: DC | PRN

## 2017-06-24 MED ORDER — HYDROCODONE-ACETAMINOPHEN 7.5-325 MG/15ML PO SOLN: 5.0000 mL | Freq: Four times a day (QID) | ORAL | Status: DC | PRN

## 2017-06-24 MED ORDER — ZOLPIDEM TARTRATE 5 MG PO TABS
5.0000 mg | ORAL_TABLET | Freq: Every evening | ORAL | Status: DC | PRN
  Administered 2017-06-27: 5 mg via ORAL
  Filled 2017-06-24: qty 1

## 2017-06-24 MED ORDER — LIDOCAINE 5 % EX PTCH
1.0000 | MEDICATED_PATCH | CUTANEOUS | Status: DC
  Administered 2017-06-24 – 2017-06-28 (×5): 1 via TRANSDERMAL
  Filled 2017-06-24 (×5): qty 1

## 2017-06-24 MED ORDER — SODIUM CHLORIDE 0.9 % IV SOLN
INTRAVENOUS | Status: DC
  Administered 2017-06-24 – 2017-06-27 (×5): via INTRAVENOUS

## 2017-06-24 MED ORDER — ACETAMINOPHEN 500 MG PO TABS: 500.0000 mg | ORAL_TABLET | Freq: Four times a day (QID) | ORAL | Status: DC | PRN

## 2017-06-24 MED ORDER — ONDANSETRON HCL 4 MG PO TABS: 4.0000 mg | ORAL_TABLET | Freq: Four times a day (QID) | ORAL | Status: DC | PRN

## 2017-06-24 MED ORDER — METOPROLOL SUCCINATE ER 25 MG PO TB24
25.0000 mg | ORAL_TABLET | Freq: Two times a day (BID) | ORAL | Status: DC
  Administered 2017-06-24 – 2017-06-27 (×9): 25 mg via ORAL
  Filled 2017-06-24 (×10): qty 1

## 2017-06-24 MED ORDER — DOCUSATE SODIUM 100 MG PO CAPS
100.0000 mg | ORAL_CAPSULE | Freq: Two times a day (BID) | ORAL | Status: DC
  Administered 2017-06-24 – 2017-06-27 (×9): 100 mg via ORAL
  Filled 2017-06-24 (×9): qty 1

## 2017-06-24 MED ORDER — ONDANSETRON HCL 4 MG/2ML IJ SOLN: 4.0000 mg | Freq: Four times a day (QID) | INTRAMUSCULAR | Status: DC | PRN

## 2017-06-24 MED ORDER — PANTOPRAZOLE SODIUM 40 MG IV SOLR
40.0000 mg | Freq: Two times a day (BID) | INTRAVENOUS | Status: DC
  Administered 2017-06-24 (×3): 40 mg via INTRAVENOUS
  Filled 2017-06-24 (×4): qty 40

## 2017-06-24 MED ORDER — SENNA 8.6 MG PO TABS
1.0000 | ORAL_TABLET | Freq: Two times a day (BID) | ORAL | Status: DC
  Administered 2017-06-24 – 2017-06-27 (×9): 8.6 mg via ORAL
  Filled 2017-06-24 (×9): qty 1

## 2017-06-24 MED ORDER — LEVOTHYROXINE SODIUM 75 MCG PO TABS
75.0000 ug | ORAL_TABLET | Freq: Every day | ORAL | Status: DC
  Administered 2017-06-24 – 2017-06-27 (×5): 75 ug via ORAL
  Filled 2017-06-24 (×4): qty 1

## 2017-06-24 NOTE — Evaluation (Signed)
Physical Therapy Evaluation Patient Details Name: Christine Perez MRN: [id number] DOB: Jun 15, 1925 Today's Date: 06/24/2017   History of Present Illness  Christine Perez is a 84 y.o. female with past medical history significant for a fib, pacemaker,  hypertension and history of fall in February with vertebral fracture , prescribed  a TLSO. Admitted 3/9 /19  after syncpal episode and BRB i  toilet.  Hypotensive in ED. CT shows multiple compression fractures of the spine  Clinical Impression  The patient is very frail. Required 2 total assist for mobility to  Sitting at bedside only. Patient  Given a sip of water and notes to choke then had heaving. RN notified. Pt admitted with above diagnosis. Pt currently with functional limitations due to the deficits listed below (see PT Problem List).  Pt will benefit from skilled PT to increase their independence and safety with mobility to allow discharge to the venue listed below.       Follow Up Recommendations SNF    Equipment Recommendations  None recommended by PT    Recommendations for Other Services       Precautions / Restrictions Precautions Precautions: Fall;Back Required Braces or Orthoses: Spinal Brace Spinal Brace: Thoracolumbosacral orthotic(was prescribed in feb. not wearing per daughter)      Mobility  Bed Mobility Overal bed mobility: Needs Assistance Bed Mobility: Supine to Sit;Sit to Supine     Supine to sit: Total assist;+2 for physical assistance;+2 for safety/equipment;HOB elevated Sit to supine: +2 for physical assistance;Total assist;+2 for safety/equipment;HOB elevated   General bed mobility comments: used bed pad to cradle the patient's trunk and  assisted with legs to sitting at the bed edge. required total assist to return to supine.  Transfers                 General transfer comment: unable   Ambulation/Gait                Stairs            Wheelchair Mobility    Modified Rankin  (Stroke Patients Only)       Balance Overall balance assessment: History of Falls;Needs assistance Sitting-balance support: Bilateral upper extremity supported;Feet supported Sitting balance-Leahy Scale: Fair Sitting balance - Comments: sits at Midline                                     Pertinent Vitals/Pain Pain Assessment: 0-10 Pain Score: 9  Pain Location: back Pain Descriptors / Indicators: Discomfort;Grimacing;Penetrating Pain Intervention(s): Premedicated before session;Repositioned    Home Living Family/patient expects to be discharged to:: Private residence Living Arrangements: Children Available Help at Discharge: Family;Available 24 hours/day Type of Home: House Home Access: Stairs to enter Entrance Stairs-Rails: Psychiatric nurse of Steps: 3 Home Layout: One level Home Equipment: Walker - 4 wheels      Prior Function Level of Independence: Needs assistance   Gait / Transfers Assistance Needed: recently daughter assisting to stand and walk short distances in house with RW.           Hand Dominance        Extremity/Trunk Assessment   Upper Extremity Assessment Upper Extremity Assessment: Generalized weakness    Lower Extremity Assessment Lower Extremity Assessment: RLE deficits/detail;LLE deficits/detail RLE Deficits / Details: required assist to  move in bed, assist to flex hip LLE Deficits / Details: same as right    Cervical / Trunk  Assessment Cervical / Trunk Assessment: Kyphotic  Communication      Cognition Arousal/Alertness: Awake/alert Behavior During Therapy: WFL for tasks assessed/performed Overall Cognitive Status: No family/caregiver present to determine baseline cognitive functioning                                        General Comments      Exercises     Assessment/Plan    PT Assessment Patient needs continued PT services  PT Problem List Decreased strength;Decreased  range of motion;Decreased knowledge of use of DME;Decreased activity tolerance;Decreased safety awareness;Decreased knowledge of precautions;Decreased mobility;Pain       PT Treatment Interventions DME instruction;Therapeutic exercise;Gait training;Functional mobility training;Therapeutic activities;Patient/family education    PT Goals (Current goals can be found in the Care Plan section)  Acute Rehab PT Goals Patient Stated Goal: agreed to sitting up PT Goal Formulation: With patient Time For Goal Achievement: 07/08/17 Potential to Achieve Goals: Fair    Frequency Min 2X/week   Barriers to discharge        Co-evaluation               AM-PAC PT "6 Clicks" Daily Activity  Outcome Measure Difficulty turning over in bed (including adjusting bedclothes, sheets and blankets)?: Unable Difficulty moving from lying on back to sitting on the side of the bed? : Unable Difficulty sitting down on and standing up from a chair with arms (e.g., wheelchair, bedside commode, etc,.)?: Unable Help needed moving to and from a bed to chair (including a wheelchair)?: Total Help needed walking in hospital room?: Total Help needed climbing 3-5 steps with a railing? : Total 6 Click Score: 6    End of Session   Activity Tolerance: Patient limited by pain Patient left: in bed;with call bell/phone within reach;with bed alarm set Nurse Communication: Mobility status PT Visit Diagnosis: Unsteadiness on feet (R26.81);History of falling (Z91.81)    Time: 7915-0569 PT Time Calculation (min) (ACUTE ONLY): 40 min   Charges:   PT Evaluation $PT Eval Moderate Complexity: 1 Mod PT Treatments $Therapeutic Activity: 23-37 mins   PT G CodesTresa Endo PT [phone]   Claretha Cooper 06/24/2017, 1:37 PM

## 2017-06-24 NOTE — Progress Notes (Signed)
PROGRESS NOTE    Christine Perez  QKM:[id number] DOB: 1925/12/21 DOA: 07/08/2017 PCP: Marletta Lor, MD     Brief Narrative:  Christine Perez is a 84 y.o. female with past medical history significant for a fib on amiodarone, Xarelto, and a pacemaker, history of hypertension and history of fall in February with vertebral fracture on nonsteroidals and hydrocodone for pain control.  According to the daughter, patient stopped nonsteroidals fairly long time ago.  Today, the patient had a syncopal episode while sitting in the bathroom and the daughter noticed bright red blood in the commode.  Workup in the ER did not show any melena or bright red blood per rectum but the stool was guaiac positive.  She was hypotensive in the emergency room in the 57X systolic but responded to fluid resuscitation and her systolic blood pressure now is 117 .  Her troponin was borderline elevated but has a history of elevated troponin.  She was admitted secondary to syncope, GI bleed on Xarelto.  Assessment & Plan:   Active Problems:   Syncope   Syncope Could be secondary to vasovagal response versus GI bleed PT OT Orthostatic vital signs IVF   GI bleed Hemoccult positive stool on admission Hemoglobin 12.2 --> 9.4 this morning  Hold Xarelto Continue IV Protonix Consult GI, previously evaluated with a colonoscopy by Dr. Lyla Son in 2002  Low back pain with chronic vertebral fracture Xray: Chronic lumbar and lower thoracic compression fractures without evidence of acute osseous abnormality Hold off on inpatient orthopedic consult for any injections given patient has been recently on Xarelto PT consulted  Essential hypertension Continue metoprolol   Paroxysmal atrial fibrillation Continue amiodarone, metoprolol  Hold Xarelto NSR this morning   Elevated troponin Flat trend, not consistent with ACS and no complaints of CP   Hypothyroidism Continue synthroid    DVT prophylaxis: SCD Code  Status: Full Family Communication: Daughter at bedside Disposition Plan: Pending GI consultation   Consultants:   GI   Procedures:   None  Antimicrobials:  Anti-infectives (From admission, onward)   None       Subjective: No new complaints.  No further blood in stools since admission.  Denies any chest pain or abdominal pain, no nausea, vomiting.  Patient with some level of dementia and hearing loss.  Daughter at bedside  Objective: Vitals:   06/17/2017 2300 07/11/2017 2315 06/15/2017 2359 06/24/17 0649  BP:  (!) 116/56 (!) 116/50 (!) 146/87  Pulse: (!) 59 (!) 59 (!) 58 71  Resp: 16 16 20 20   Temp:   98.3 F (36.8 C) 97.8 F (36.6 C)  TempSrc:   Oral Oral  SpO2: (!) 81% 100% 94% 98%  Weight:   55.4 kg (122 lb 2.2 oz)   Height:   4\' 10"  (1.473 m)     Intake/Output Summary (Last 24 hours) at 06/24/2017 1313 Last data filed at 06/24/2017 0650 Gross per 24 hour  Intake 4014.58 ml  Output 175 ml  Net 3839.58 ml   Filed Weights   07/06/2017 1700 07/09/2017 2359  Weight: 59 kg (130 lb) 55.4 kg (122 lb 2.2 oz)    Examination:  General exam: Appears calm and comfortable  Respiratory system: Clear to auscultation. Respiratory effort normal. Cardiovascular system: S1 & S2 heard, RRR. No JVD, murmurs, rubs, gallops or clicks. No pedal edema. Gastrointestinal system: Abdomen is nondistended, soft and nontender. No organomegaly or masses felt. Normal bowel sounds heard. Central nervous system: Alert. No focal neurological deficits. Extremities:  Symmetric 5 x 5 power. Skin: No rashes, lesions or ulcers  Data Reviewed: I have personally reviewed following labs and imaging studies  CBC: Recent Labs  Lab 06/28/2017 1735 06/24/17 0018 06/24/17 0557 06/24/17 0816 06/24/17 1224  WBC 9.3  --  8.9  --   --   NEUTROABS 8.3*  --   --   --   --   HGB 12.2 9.9* 9.4* 10.6* 10.0*  HCT 37.9 31.3* 29.5*  --   --   MCV 102.2*  --  103.1*  --   --   PLT 121*  --  83*  --   --    Basic  Metabolic Panel: Recent Labs  Lab 06/22/2017 1735  NA 140  K 3.9  CL 102  CO2 26  GLUCOSE 145*  BUN 33*  CREATININE 1.83*  CALCIUM 10.2   GFR: Estimated Creatinine Clearance: 15.4 mL/min (A) (by C-G formula based on SCr of 1.83 mg/dL (H)). Liver Function Tests: Recent Labs  Lab 07/09/2017 1735  AST 360*  ALT 326*  ALKPHOS 185*  BILITOT 1.6*  PROT 6.8  ALBUMIN 2.9*   No results for input(s): LIPASE, AMYLASE in the last 168 hours. No results for input(s): AMMONIA in the last 168 hours. Coagulation Profile: Recent Labs  Lab 07/09/2017 1735  INR 1.04   Cardiac Enzymes: Recent Labs  Lab 07/09/2017 1735 06/24/17 0018 06/24/17 0557  TROPONINI 0.05* 0.04* 0.05*   BNP (last 3 results) No results for input(s): PROBNP in the last 8760 hours. HbA1C: No results for input(s): HGBA1C in the last 72 hours. CBG: Recent Labs  Lab 06/29/2017 1754  GLUCAP 135*   Lipid Profile: No results for input(s): CHOL, HDL, LDLCALC, TRIG, CHOLHDL, LDLDIRECT in the last 72 hours. Thyroid Function Tests: No results for input(s): TSH, T4TOTAL, FREET4, T3FREE, THYROIDAB in the last 72 hours. Anemia Panel: No results for input(s): VITAMINB12, FOLATE, FERRITIN, TIBC, IRON, RETICCTPCT in the last 72 hours. Sepsis Labs: No results for input(s): PROCALCITON, LATICACIDVEN in the last 168 hours.  No results found for this or any previous visit (from the past 240 hour(s)).     Radiology Studies: Dg Lumbar Spine Complete  Result Date: 07/03/2017 CLINICAL DATA:  Low back pain. Right flank pain. Syncopal episode. Initial encounter. EXAM: LUMBAR SPINE - COMPLETE 4+ VIEW COMPARISON:  05/14/2017 FINDINGS: There is transitional lumbosacral anatomy, and the transitional segment will be considered a partially lumbarized S1 for consistency with the prior study. The bones are diffusely osteopenic. An L3 compression fracture is again seen with similar severe vertebral body height loss. A mild T12 compression  fracture is less well profiled on today's lateral radiograph than on the prior study but is without grossly progressive vertebral body height loss. No new compression fracture is identified. Mild upper lumbar levoscoliosis is noted. No significant listhesis is seen. Disc space narrowing at L2-3 is similar to the prior study. Mild lower lumbar facet arthrosis is noted. There is aortic atherosclerosis. IMPRESSION: Chronic lumbar and lower thoracic compression fractures without evidence of acute osseous abnormality. Electronically Signed   By: Logan Bores M.D.   On: 06/18/2017 18:39   Dg Abdomen Acute W/chest  Result Date: 06/22/2017 CLINICAL DATA:  84 year old female status post syncopal episode while using the bathroom. Lower abdominal pain. EXAM: DG ABDOMEN ACUTE W/ 1V CHEST COMPARISON:  Chest radiographs 06/10/2017 and earlier. Lumbar radiographs 05/14/2017. FINDINGS: Semi upright AP view of the chest. Stable cardiomegaly and mediastinal contours. Stable left chest cardiac pacemaker. Calcified  aortic atherosclerosis. Stable lung volumes. No pneumothorax, pulmonary edema or acute pulmonary opacity. Chronic blunting of the right costophrenic angle. No pneumoperitoneum. Non obstructed bowel gas pattern. Left-side-down lateral decubitus view of the abdomen also demonstrates no free air. Stable visualized osseous structures, including osteopenia and L3 compression fracture. A round 7-8 millimeter calcification projects to the right of the mid lumbar spine and is stable since February. IMPRESSION: 1.  No acute cardiopulmonary abnormality. 2. Normal bowel gas pattern, no free air. 3. A 7-8 mm right abdominal calcification is stable since February and of uncertain etiology and significance. Correlate for hematuria. Electronically Signed   By: Genevie Ann M.D.   On: 07/09/2017 18:36      Scheduled Meds: . amiodarone  200 mg Oral BID  . chlorhexidine  15 mL Mouth Rinse BID  . docusate sodium  100 mg Oral BID  .  levothyroxine  75 mcg Oral QAC breakfast  . lidocaine  1 patch Transdermal Q24H  . mouth rinse  15 mL Mouth Rinse q12n4p  . metoprolol succinate  25 mg Oral BID  . pantoprazole (PROTONIX) IV  40 mg Intravenous Q12H  . senna  1 tablet Oral BID   Continuous Infusions: . sodium chloride 125 mL/hr at 06/24/17 0029  . sodium chloride 75 mL/hr at 06/24/17 0120  . sodium chloride Stopped (06/24/17 0927)     LOS: 1 day    Time spent: 25 minutes   Dessa Phi, DO [facility] [email or web address] Password [email or web address] 06/24/2017, 1:13 PM

## 2017-06-24 NOTE — Consult Note (Addendum)
Consultation  Referring Provider: [facility]/[place]Primary Care Physician:  Marletta Lor, MD Primary Gastroenterologist:  None- very remote- [place]  Reason for Consultation:  Probable GI bleed  HPI: Christine Perez is a 84 y.o. female who was admitted through the emergency room last night, brought in by EMS after a syncopal episode while in the bathroom.  Her daughter related she had bright red blood in the commode.  She was documented heme positive on admission but has not had any overt bleeding and did not have bright red blood per rectum on rectal exam per ER.  She is on chronic Xarelto, for history of atrial fibrillation. Other medical problems include hypertension, hypothyroidism, COPD, prior history of breast cancer, and is status post cholecystectomy. She has had very remote colonoscopy in 2002 per Dr. Rachelle Hora showing moderate to severe left-sided diverticulosis, no polyps at that time but apparently has history of adenomatous polyps prior to this. Patient was hypotensive in the emergency room with systolic pressure in the 55D, troponin borderline, but apparently has history of elevated troponin.  Hemoglobin was 12.2 on admission, and has dropped to 9.4 today. Her baseline hemoglobin appears to be between 11 and 12.  She is not microcytic. Blood pressure has stabilized, she has not required transfusion. She has no complaints of abdominal pain and did not have any abdominal pain or cramping prior to the episode of bleeding last evening.  She has not had any further bowel movements since admission.  She has been feeling weak in general since she fell in February and sustained to the vertebral fractures.  Her appetite has been poor.  She denies any nausea or vomiting heartburn or indigestion. She is not certain that she could tolerate a bowel prep and is undecided about proceeding with a colonoscopy at her age.     Past Medical History:  Diagnosis Date  .  Atrial fibrillation ([place])   . CARDIAC ARRHYTHMIA 01/08/2010  . COLONIC POLYPS, HX OF 10/09/2006  . COPD (chronic obstructive pulmonary disease) ([place])   . HYPERLIPIDEMIA 10/09/2006  . HYPERTENSION 10/09/2006  . HYPOTHYROIDISM 10/09/2006  . LASSITUDE 08/15/2009  . OSTEOPOROSIS 10/09/2006  . VERTIGO 08/07/2009  . Wears glasses   . Wears hearing aid    left    Past Surgical History:  Procedure Laterality Date  . ABDOMINAL HYSTERECTOMY    . APPENDECTOMY    . BREAST SURGERY     left - lumpectomy  . CARDIOVERSION N/A 08/09/2013   Procedure: CARDIOVERSION;  Surgeon: Candee Furbish, MD;  Location: [facility] ENDOSCOPY;  Service: Cardiovascular;  Laterality: N/A;  . CATARACT EXTRACTION    . CHOLECYSTECTOMY    . PACEMAKER PLACEMENT N/A 12/05/2013  . PARTIAL MASTECTOMY WITH NEEDLE LOCALIZATION Left 06/29/2012   Procedure: PARTIAL MASTECTOMY WITH NEEDLE LOCALIZATION;  Surgeon: Adin Hector, MD;  Location: [facility];  Service: General;  Laterality: Left;  . TONSILLECTOMY    . YAG LASER APPLICATION      Prior to Admission medications   Medication Sig Start Date End Date Taking? Authorizing Provider  amiodarone (PACERONE) 200 MG tablet TAKE 1 TABLET BY MOUTH TWICE DAILY 03/09/17  Yes Troy Sine, MD  calcium-vitamin D (OSCAL WITH D) 500-200 MG-UNIT per tablet Take 1 tablet by mouth 2 (two) times daily.    Yes [provider]  cholecalciferol (VITAMIN D) 1000 units tablet Take 1,000 Units by mouth daily.   Yes [provider]  furosemide (LASIX) 20 MG tablet Take 1  tablet (20 mg total) by mouth daily. 08/12/16  Yes Marletta Lor, MD  HYDROcodone-acetaminophen (HYCET) 7.5-325 mg/15 ml solution Take 5 mLs by mouth every 6 (six) hours as needed for moderate pain or severe pain. 06/18/17  Yes Marletta Lor, MD  losartan (COZAAR) 100 MG tablet TAKE 1 TABLET BY MOUTH ONCE DAILY 03/16/17  Yes Marletta Lor, MD  metoprolol succinate (TOPROL-XL) 50 MG 24 hr tablet  TAKE 1 TABLET BY MOUTH TWICE DAILY 04/20/17  Yes Troy Sine, MD  Multiple Vitamin (MULTIVITAMIN) tablet Take 1 tablet by mouth daily.   Yes [provider]  ondansetron (ZOFRAN) 4 MG tablet Take 1 tablet (4 mg total) by mouth every 8 (eight) hours as needed for nausea or vomiting. 06/18/17  Yes Marletta Lor, MD  spironolactone (ALDACTONE) 25 MG tablet Take 12.5 mg by mouth daily. 01/14/17  Yes [provider]  SYNTHROID 75 MCG tablet TAKE 1 TABLET BY MOUTH ONCE DAILY 02/16/17  Yes Marletta Lor, MD  vitamin C (ASCORBIC ACID) 500 MG tablet Take 500 mg by mouth daily.   Yes [provider]  vitamin E 100 UNIT capsule Take 100 Units by mouth daily.   Yes [provider]  XARELTO 15 MG TABS tablet TAKE 1 TABLET BY MOUTH ONCE DAILY WITH SUPPER 06/18/17  Yes Marletta Lor, MD  acetaminophen (TYLENOL) 500 MG tablet Take 500 mg by mouth every 6 (six) hours as needed for moderate pain or headache.    [provider]  amiodarone (PACERONE) 200 MG tablet Take 1.5 tablets (300 mg total) by mouth daily. Patient not taking: Reported on 06/10/2017 08/04/16   Troy Sine, MD  diclofenac (FLECTOR) 1.3 % PTCH Place 1 patch onto the skin 2 (two) times daily. Patient not taking: Reported on 06/10/2017 05/14/17   Carmin Muskrat, MD  diclofenac sodium (VOLTAREN) 1 % GEL Apply 4 g topically 4 (four) times daily. Patient not taking: Reported on 06/10/2017 05/03/17   Blanchie Dessert, MD  ipratropium (ATROVENT HFA) 17 MCG/ACT inhaler Inhale 2 puffs into the lungs every 4 (four) hours as needed for wheezing. 03/18/16   Marletta Lor, MD  lidocaine (LIDODERM) 5 % Place 1 patch onto the skin daily. Remove & Discard patch within 12 hours or as directed by MD Patient not taking: Reported on 06/10/2017 05/03/17   Blanchie Dessert, MD  lidocaine (LIDODERM) 5 % Place 1 patch onto the skin daily. Remove & Discard patch within 12 hours or as directed by  MD Patient not taking: Reported on 06/10/2017 05/11/17   Marletta Lor, MD  traMADol (ULTRAM) 50 MG tablet Take 1 tablet (50 mg total) by mouth 2 (two) times daily as needed (pain). Patient not taking: Reported on 06/10/2017 05/28/17   Marletta Lor, MD  zolpidem (AMBIEN) 5 MG tablet TAKE 1 TABLET BY MOUTH AT BEDTIME Patient not taking: Reported on 06/10/2017 05/13/17   Marletta Lor, MD    Current Facility-Administered Medications  Medication Dose Route Frequency Provider Last Rate Last Dose  . 0.9 %  sodium chloride infusion   Intravenous Continuous Merton Border, MD 125 mL/hr at 06/24/17 0029    . 0.9 %  sodium chloride infusion   Intravenous Continuous Merton Border, MD 75 mL/hr at 06/24/17 0120    . 0.9 %  sodium chloride infusion   Intravenous Once Merton Border, MD   Stopped at 06/24/17 [phone]  . acetaminophen (TYLENOL) tablet 650 mg  650 mg Oral Q6H  PRN Merton Border, MD       Or  . acetaminophen (TYLENOL) suppository 650 mg  650 mg Rectal Q6H PRN Merton Border, MD      . albuterol (PROVENTIL) (2.5 MG/3ML) 0.083% nebulizer solution 2.5 mg  2.5 mg Nebulization Q2H PRN Merton Border, MD      . amiodarone (PACERONE) tablet 200 mg  200 mg Oral BID Merton Border, MD   200 mg at 06/24/17 0927  . chlorhexidine (PERIDEX) 0.12 % solution 15 mL  15 mL Mouth Rinse BID Dessa Phi, DO   15 mL at 06/24/17 1144  . docusate sodium (COLACE) capsule 100 mg  100 mg Oral BID Merton Border, MD   100 mg at 06/24/17 7782  . HYDROcodone-acetaminophen (NORCO/VICODIN) 5-325 MG per tablet 1-2 tablet  1-2 tablet Oral Q4H PRN Merton Border, MD   2 tablet at 06/24/17 0927  . levothyroxine (SYNTHROID, LEVOTHROID) tablet 75 mcg  75 mcg Oral QAC breakfast Merton Border, MD   75 mcg at 06/24/17 4235  . lidocaine (LIDODERM) 5 % 1 patch  1 patch Transdermal Q24H Merton Border, MD   1 patch at 06/24/17 [phone]  . MEDLINE mouth rinse  15 mL Mouth Rinse q12n4p Dessa Phi, DO   15 mL at 06/24/17 1145  . metoprolol succinate  (TOPROL-XL) 24 hr tablet 25 mg  25 mg Oral BID Merton Border, MD   25 mg at 06/24/17 4315  . ondansetron (ZOFRAN) tablet 4 mg  4 mg Oral Q6H PRN Merton Border, MD       Or  . ondansetron (ZOFRAN) injection 4 mg  4 mg Intravenous Q6H PRN Merton Border, MD      . pantoprazole (PROTONIX) injection 40 mg  40 mg Intravenous Q12H Merton Border, MD   40 mg at 06/24/17 0927  . senna (SENOKOT) tablet 8.6 mg  1 tablet Oral BID Merton Border, MD   8.6 mg at 06/24/17 4008  . zolpidem (AMBIEN) tablet 5 mg  5 mg Oral QHS PRN Merton Border, MD        Allergies as of 06/20/2017 - Review Complete 07/10/2017  Allergen Reaction Noted  . Codeine Shortness Of Breath   . Tramadol Other (See Comments) 05/14/2017    Family History  Problem Relation Age of Onset  . Cancer Mother   . Heart failure Father   . Cancer Sister   . Stroke Sister     Social History   Socioeconomic History  . Marital status: Widowed    Spouse name: Not on file  . Number of children: Not on file  . Years of education: Not on file  . Highest education level: Not on file  Occupational History  . Not on file  Social Needs  . Financial resource strain: Not on file  . Food insecurity:    Worry: Not on file    Inability: Not on file  . Transportation needs:    Medical: Not on file    Non-medical: Not on file  Tobacco Use  . Smoking status: Never Smoker  . Smokeless tobacco: Never Used  Substance and Sexual Activity  . Alcohol use: No  . Drug use: No  . Sexual activity: Not Currently  Lifestyle  . Physical activity:    Days per week: Not on file    Minutes per session: Not on file  . Stress: Not on file  Relationships  . Social connections:    Talks on phone: Not on file    Gets together:  Not on file    Attends religious service: Not on file    Active member of club or organization: Not on file    Attends meetings of clubs or organizations: Not on file    Relationship status: Not on file  . Intimate partner violence:    Fear  of current or ex partner: Not on file    Emotionally abused: Not on file    Physically abused: Not on file    Forced sexual activity: Not on file  Other Topics Concern  . Not on file  Social History Narrative  . Not on file    Review of Systems: Pertinent positive and negative review of systems were noted in the above HPI section.  All other review of systems was otherwise negative.  Physical Exam: Vital signs in last 24 hours: Temp:  [97.8 F (36.6 C)-98.3 F (36.8 C)] 98.1 F (36.7 C) (04/10 1357) Pulse Rate:  [58-75] 71 (04/10 1357) Resp:  [15-28] 18 (04/10 1357) BP: (63-146)/(45-91) 117/65 (04/10 1357) SpO2:  [81 %-100 %] 100 % (04/10 1357) Weight:  [122 lb 2.2 oz (55.4 kg)-130 lb (59 kg)] 122 lb 2.2 oz (55.4 kg) (04/09 2359)   General:   Alert,  Well-developed, well-nourished, elderly white female pleasant and cooperative in NAD, fatigued appearing.  Daughter at bedside Head:  Normocephalic and atraumatic. Eyes:  Sclera clear, no icterus.   Conjunctiva pale Ears:  Normal auditory acuity. Nose:  No deformity, discharge,  or lesions. Mouth:  No deformity or lesions.   Neck:  Supple; no masses or thyromegaly. Lungs:  Clear throughout to auscultation.   No wheezes, crackles, or rhonchi. Heart:  Regular rate and rhythm; no murmurs, clicks, rubs,  or gallops. Abdomen:  Soft,nontender, BS active,nonpalp mass or hsm.   Rectal:  Deferred, documented Hemoccult positive Msk:  Symmetrical without gross deformities. . Pulses:  Normal pulses noted. Extremities:  Without clubbing or edema. Neurologic:  Alert and  oriented x4;  grossly normal neurologically. Skin:  Intact without significant lesions or rashes.. Psych:  Alert and cooperative. Normal mood and affect.  Intake/Output from previous day: 04/09 0701 - 04/10 0700 In: 4014.6 [I.V.:2014.6; IV Piggyback:2000] Out: 175 [Urine:175] Intake/Output this shift: No intake/output data recorded.  Lab Results: Recent Labs     07/09/2017 1735 06/24/17 0018 06/24/17 0557 06/24/17 0816 06/24/17 1224  WBC 9.3  --  8.9  --   --   HGB 12.2 9.9* 9.4* 10.6* 10.0*  HCT 37.9 31.3* 29.5*  --   --   PLT 121*  --  83*  --   --    BMET Recent Labs    07/11/2017 1735 06/24/17 1224  NA 140 142  K 3.9 3.5  CL 102 109  CO2 26 23  GLUCOSE 145* 93  BUN 33* 34*  CREATININE 1.83* 1.98*  CALCIUM 10.2 8.8*   LFT Recent Labs    07/14/2017 1735  PROT 6.8  ALBUMIN 2.9*  AST 360*  ALT 326*  ALKPHOS 185*  BILITOT 1.6*   PT/INR Recent Labs    06/26/2017 1735  LABPROT 13.5  INR 1.04       IMPRESSION:  #49 84 year old white female admitted after a syncopal episode on the commode at which time she passed rule mixed with dark red blood x1. Patient has not had any further evidence of active bleeding since.  Bleed occurred in setting of chronic anticoagulation with Xarelto. She has had a 3 g drop in hemoglobin over the past couple  of weeks.  Not clear that her syncope was precipitated by acute hemorrhage/volume depletion versus vasovagal syncope  Patient appears to have had a self-limited lower GI bleed at this time, diverticulosis previously documented on colonoscopy 2002.  This may have been a self-limited diverticular bleed.  Cannot rule out occult colon lesion  #2 anemia-presumably secondary to GI blood loss, no evidence for iron deficiency to suggest chronic blood loss #3 atrial fibrillation 4.  Status post pacemaker placement 5.  Chronic anticoagulation-on Xarelto 6.  COPD 7.  History of breast cancer 8.  History of hypertension 9.  Status post cholecystectomy 10.  Previously documented diverticulosis and remote history of colon polyps  PLAN: #1 continue clear liquid diet this evening #2Serial hemoglobins and transfuse for hemoglobin 8.5 or less been advanced age and history of syncope #3 I discussed colonoscopy with the patient and her daughter, versus conservative management.  Also discussed option of CT  of the abdomen and pelvis which would not rule out small colon polyps but would rule out a colon cancer. They will discuss, and we can make decision tomorrow based on her course regarding proceeding with colonoscopy versus CT imaging.   Amy Esterwood  06/24/2017, 2:46 PM   Attending physician's note   I have taken an interval history, reviewed the chart and examined the patient. I agree with the Advanced Practitioner's note, impression and recommendations.  Discussed with patient's daughter.  63 year old with syncope with fall, on Xarelto.  One episode of lower GI bleed. Hb dropped to 9 from baseline of 12.4.  Has right flank pain.  Could have abdominal hematoma/retroperitoneal bleed.  CT in a.m, does not want colonoscopy at this time. Trend hemoglobin.   Carmell Austria, MD

## 2017-06-24 NOTE — Telephone Encounter (Signed)
Left message to return call 

## 2017-06-24 NOTE — Progress Notes (Signed)
Unable to complete orthostatic VS, patient is unable to stand due to severe pain in her back. Will continue to try if patient is willing to get up. Will also let night shift RN and NT know.  Carmela Hurt, RN

## 2017-06-25 ENCOUNTER — Inpatient Hospital Stay (HOSPITAL_COMMUNITY): Payer: Medicare Other

## 2017-06-25 DIAGNOSIS — I10 Essential (primary) hypertension: Secondary | ICD-10-CM

## 2017-06-25 DIAGNOSIS — Z7901 Long term (current) use of anticoagulants: Secondary | ICD-10-CM

## 2017-06-25 DIAGNOSIS — M549 Dorsalgia, unspecified: Secondary | ICD-10-CM

## 2017-06-25 DIAGNOSIS — S32030A Wedge compression fracture of third lumbar vertebra, initial encounter for closed fracture: Secondary | ICD-10-CM

## 2017-06-25 DIAGNOSIS — N184 Chronic kidney disease, stage 4 (severe): Secondary | ICD-10-CM

## 2017-06-25 DIAGNOSIS — M546 Pain in thoracic spine: Secondary | ICD-10-CM

## 2017-06-25 DIAGNOSIS — I480 Paroxysmal atrial fibrillation: Secondary | ICD-10-CM

## 2017-06-25 LAB — CBC
HEMATOCRIT: 27.7 % — AB (ref 36.0–46.0)
Hemoglobin: 8.9 g/dL — ABNORMAL LOW (ref 12.0–15.0)
MCH: 33.1 pg (ref 26.0–34.0)
MCHC: 32.1 g/dL (ref 30.0–36.0)
MCV: 103 fL — ABNORMAL HIGH (ref 78.0–100.0)
Platelets: 85 10*3/uL — ABNORMAL LOW (ref 150–400)
RBC: 2.69 MIL/uL — ABNORMAL LOW (ref 3.87–5.11)
RDW: 17.6 % — AB (ref 11.5–15.5)
WBC: 7.2 10*3/uL (ref 4.0–10.5)

## 2017-06-25 LAB — BASIC METABOLIC PANEL
Anion gap: 10 (ref 5–15)
BUN: 32 mg/dL — AB (ref 6–20)
CALCIUM: 8.7 mg/dL — AB (ref 8.9–10.3)
CO2: 21 mmol/L — ABNORMAL LOW (ref 22–32)
CREATININE: 1.6 mg/dL — AB (ref 0.44–1.00)
Chloride: 111 mmol/L (ref 101–111)
GFR calc non Af Amer: 27 mL/min — ABNORMAL LOW (ref >60)
GFR, EST AFRICAN AMERICAN: 32 mL/min — AB (ref >60)
Glucose, Bld: 74 mg/dL (ref 65–99)
Potassium: 3.4 mmol/L — ABNORMAL LOW (ref 3.5–5.1)
SODIUM: 142 mmol/L (ref 135–145)

## 2017-06-25 LAB — HEPATIC FUNCTION PANEL
ALBUMIN: 2.1 g/dL — AB (ref 3.5–5.0)
ALK PHOS: 139 U/L — AB (ref 38–126)
ALT: 253 U/L — ABNORMAL HIGH (ref 14–54)
AST: 263 U/L — ABNORMAL HIGH (ref 15–41)
Bilirubin, Direct: 0.8 mg/dL — ABNORMAL HIGH (ref 0.1–0.5)
Indirect Bilirubin: 0.6 mg/dL (ref 0.3–0.9)
TOTAL PROTEIN: 4.9 g/dL — AB (ref 6.5–8.1)
Total Bilirubin: 1.4 mg/dL — ABNORMAL HIGH (ref 0.3–1.2)

## 2017-06-25 MED ORDER — POTASSIUM CHLORIDE CRYS ER 20 MEQ PO TBCR
40.0000 meq | EXTENDED_RELEASE_TABLET | Freq: Once | ORAL | Status: AC
  Administered 2017-06-25: 40 meq via ORAL
  Filled 2017-06-25: qty 2

## 2017-06-25 MED ORDER — PANTOPRAZOLE SODIUM 40 MG PO TBEC
40.0000 mg | DELAYED_RELEASE_TABLET | Freq: Every day | ORAL | Status: DC
  Administered 2017-06-25 – 2017-06-27 (×3): 40 mg via ORAL
  Filled 2017-06-25 (×4): qty 1

## 2017-06-25 NOTE — Clinical Social Work Note (Signed)
Clinical Social Work Assessment  Patient Details  Name: Christine Perez MRN: [id number] Date of Birth: 1926-01-12  Date of referral:  06/25/17               Reason for consult:  Facility Placement                Permission sought to share information with:  Facility Sport and exercise psychologist, Family Supports Permission granted to share information::  Yes, Verbal Permission Granted  Name::     Oralia Manis  Agency::     Relationship::  daughter  Contact Information:  [phone]  Housing/Transportation Living arrangements for the past 2 months:  Single Family Home Source of Information:  Patient, Adult Children Patient Interpreter Needed:  None Criminal Activity/Legal Involvement Pertinent to Current Situation/Hospitalization:  No - Comment as needed Significant Relationships:  Adult Children Lives with:  Adult Children Do you feel safe going back to the place where you live?  (PT recommending SNF) Need for family participation in patient care:  Yes (Comment)  Care giving concerns:  Patient from home with daughter. Patient's daughter reported that patient has been staying with her for approx. 10 years and that patient was "pretty independent" at baseline. Patient's daughter reported that patient used a walker to ambulate to the bathroom and that she assisted patient with bathing and dressing. PT recommending SNF.   Social Worker assessment / plan:  CSW spoke with patient/patient's daughter at bedside regarding PT recommendation for SNF ST rehab. CSW explained SNF placement process and medicare coverage, patient/patient's daughter agreeable to SNF for ST rehab. Patient's daughter reported that they prefer [facility] SNF because of the close proximity to their home. Patient's daughter inquired about HCPOA paperwork, CSW agreed to notify chaplin. Patient inquired about [facility] term [facility] vs rehab at SNF, CSW explained the difference. Patient's daughter reassured patient that the plan is for ST  rehab and for patient to return home after rehab.   CSW completed FL2 and will follow up with bed offers.  CSW will continue to follow and assist with discharge planning.   Employment status:  Retired Forensic scientist:  Medicare PT Recommendations:  [place] / Referral to community resources:  [place]  Patient/Family's Response to care:  Patient/patient's daughter appreciative of CSW assistance with discharge planning.  Patient/Family's Understanding of and Emotional Response to Diagnosis, Current Treatment, and Prognosis:  Patient presented calm and verbalized some understanding of PT recommendation for SNF. Patient's daughter involved in patient's care and verbalized understanding of patient's diagnosis and current treatment. Patient/patient's daughter verbalized plan for patient to discharge to SNF for ST rehab. Patient expressed concerns about [facility] and costs associated with [facility]. CSW validated patient's concerns and reiterated that the plan is for ST rehab.   Emotional Assessment Appearance:  Appears stated age Attitude/Demeanor/Rapport:  Other(Open) Affect (typically observed):  Calm Orientation:  Oriented to Self, Oriented to Place, Oriented to Situation Alcohol / Substance use:  Not Applicable Psych involvement (Current and /or in the community):  No (Comment)  Discharge Needs  Concerns to be addressed:  Care Coordination Readmission within the last 30 days:  No Current discharge risk:  Physical Impairment Barriers to Discharge:  Continued Medical Work up   [employer], LCSW 06/25/2017, 2:22 PM

## 2017-06-25 NOTE — Telephone Encounter (Signed)
Hospital bed faxed to [facility]. Message routed to [facility].

## 2017-06-25 NOTE — Progress Notes (Signed)
Patient ID: Christine Perez, female   DOB: Jan 19, 1926, 84 y.o.   MRN: [id number] Aware of request for consideration of kyphoplasty on patient.  Imaging studies have been reviewed by Dr. Laurence Ferrari.  Recommend bone scan to evaluate acuity of fractures before deciding if patient is candidate for procedure.  Above discussed with Dr. Karleen Hampshire.

## 2017-06-25 NOTE — Progress Notes (Signed)
   06/25/17 1200  Clinical Encounter Type  Visited With Patient and family together  Visit Type Initial;Psychological support;Spiritual support  Referral From Social work  Consult/Referral To Chaplain  Spiritual Encounters  Spiritual Needs Brochure;Emotional;Other (Comment) (Advance Directive )  Stress Factors  Patient Stress Factors Family relationships;Other (Comment) (Advance Directive )  Family Stress Factors Family relationships;Other (Comment) (Advance Directive )  Advance Directives (For Healthcare)  Does Patient Have a Medical Advance Directive? No  Would patient like information on creating a medical advance directive? Yes (Inpatient - patient requests chaplain consult to create a medical advance directive) (Paperwork Given and Education Completed )   I visited with the patient per referral from the social worker.  The patient requested more information on creating an Forensic scientist, [place].  The patient's daughter was present at the bedside. The patient stated that she wants to make her daughter her Healthcare Power of [place]. Both the patient's daughter and the patient stated that the patient's son differs in opinion about the healthcare wishes of the patient.  The patient stated that she is worried that her son will feel "pushed out" if she creates an Advance Directive naming her daughter as her Healthcare agent.  We explored the option of making her son her secondary Healthcare agent and listing her exact wishes on the document.  The patient wants time to look over the document before completing.   Please, contact Spiritual Care for further assistance.   Chaplain Shanon Ace M.Div., [facility]

## 2017-06-25 NOTE — NC FL2 (Signed)
[place] LEVEL OF CARE SCREENING TOOL     IDENTIFICATION  Patient Name: Christine Perez Birthdate: November 10, 1925 Sex: female Admission Date (Current Location): 06/29/2017  [facility] and [place] Number:  [place] and Address:  [facility],  [place] [address] [place]      Provider Number: [id number]  Attending Physician Name and Address:  Hosie Poisson, MD  Relative Name and Phone Number:       Current Level of Care: Hospital Recommended Level of Care: [place] Prior Approval Number:    Date Approved/Denied:   PASRR Number: [id number]  Discharge Plan: SNF    Current Diagnoses: Patient Active Problem List   Diagnosis Date Noted  . Syncope 06/17/2017  . Acute on chronic diastolic heart failure ([place]) 03/18/2016  . CHF exacerbation ([place]) 03/02/2016  . COPD with acute exacerbation ([place]) 03/02/2016  . Hypertensive urgency 03/02/2016  . Pleural effusion 03/02/2016  . SSS (sick sinus syndrome) ([place]) 03/02/2016  . CHF (congestive heart failure) ([place]) 03/02/2016  . Chronic anticoagulation 08/03/2013  . Atrial fibrillation ([place]) 08/02/2013  . Lobular carcinoma in situ of left breast 06/03/2012  . LASSITUDE 08/15/2009  . VERTIGO 08/07/2009  . Hypothyroidism 10/09/2006  . Dyslipidemia 10/09/2006  . Essential hypertension 10/09/2006  . Osteoporosis 10/09/2006  . History of colonic polyps 10/09/2006    Orientation RESPIRATION BLADDER Height & Weight     Self, Situation, Place  O2 Incontinent Weight: 122 lb 2.2 oz (55.4 kg) Height:  4\' 10"  (147.3 cm)  BEHAVIORAL SYMPTOMS/MOOD NEUROLOGICAL BOWEL NUTRITION STATUS        Diet  AMBULATORY STATUS COMMUNICATION OF NEEDS Skin   Extensive Assist Verbally Normal                       Personal Care Assistance Level of Assistance  Bathing, Feeding, Dressing Bathing Assistance: Maximum assistance Feeding assistance: Independent Dressing Assistance: Maximum  assistance     Functional Limitations Info  Sight, Hearing, Speech Sight Info: Impaired Hearing Info: Impaired Speech Info: Adequate    SPECIAL CARE FACTORS FREQUENCY  PT (By licensed PT), OT (By licensed OT)     PT Frequency: 5x/week OT Frequency: 5x/week            Contractures Contractures Info: Not present    Additional Factors Info  Code Status, Allergies Code Status Info: Full Code Allergies Info: Codeine; Tramadol           Current Medications (06/25/2017):  This is the current hospital active medication list Current Facility-Administered Medications  Medication Dose Route Frequency Provider Last Rate Last Dose  . 0.9 %  sodium chloride infusion   Intravenous Continuous Merton Border, MD   Stopped at 06/25/17 0400  . 0.9 %  sodium chloride infusion   Intravenous Continuous Merton Border, MD 75 mL/hr at 06/25/17 0400    . 0.9 %  sodium chloride infusion   Intravenous Once Merton Border, MD   Stopped at 06/24/17 [phone]  . acetaminophen (TYLENOL) tablet 650 mg  650 mg Oral Q6H PRN Merton Border, MD       Or  . acetaminophen (TYLENOL) suppository 650 mg  650 mg Rectal Q6H PRN Merton Border, MD      . albuterol (PROVENTIL) (2.5 MG/3ML) 0.083% nebulizer solution 2.5 mg  2.5 mg Nebulization Q2H PRN Merton Border, MD      . amiodarone (PACERONE) tablet 200 mg  200 mg Oral BID Merton Border, MD  200 mg at 06/25/17 1000  . chlorhexidine (PERIDEX) 0.12 % solution 15 mL  15 mL Mouth Rinse BID Dessa Phi, DO   15 mL at 06/25/17 1001  . docusate sodium (COLACE) capsule 100 mg  100 mg Oral BID Merton Border, MD   100 mg at 06/25/17 1001  . HYDROcodone-acetaminophen (NORCO/VICODIN) 5-325 MG per tablet 1-2 tablet  1-2 tablet Oral Q4H PRN Merton Border, MD   1 tablet at 06/25/17 1150  . levothyroxine (SYNTHROID, LEVOTHROID) tablet 75 mcg  75 mcg Oral QAC breakfast Merton Border, MD   75 mcg at 06/25/17 0809  . lidocaine (LIDODERM) 5 % 1 patch  1 patch Transdermal Q24H Merton Border, MD   1 patch at  06/25/17 1001  . MEDLINE mouth rinse  15 mL Mouth Rinse q12n4p Dessa Phi, DO   15 mL at 06/24/17 1558  . metoprolol succinate (TOPROL-XL) 24 hr tablet 25 mg  25 mg Oral BID Merton Border, MD   25 mg at 06/25/17 1000  . ondansetron (ZOFRAN) tablet 4 mg  4 mg Oral Q6H PRN Merton Border, MD       Or  . ondansetron (ZOFRAN) injection 4 mg  4 mg Intravenous Q6H PRN Merton Border, MD      . pantoprazole (PROTONIX) EC tablet 40 mg  40 mg Oral Daily Esterwood, Amy S, PA-C   40 mg at 06/25/17 1150  . senna (SENOKOT) tablet 8.6 mg  1 tablet Oral BID Merton Border, MD   8.6 mg at 06/25/17 1000  . zolpidem (AMBIEN) tablet 5 mg  5 mg Oral QHS PRN Merton Border, MD         Discharge Medications: Please see discharge summary for a list of discharge medications.  Relevant Imaging Results:  Relevant Lab Results:   Additional Information SSN    [id number]  Burnis Medin, LCSW

## 2017-06-25 NOTE — Progress Notes (Signed)
PROGRESS NOTE    Christine Perez  PPJ:[id number] DOB: [birth year] DOA: 07/01/2017 PCP: Marletta Lor, MD    Brief Narrative:PollyLawrenceis a89 y.o.femalewith past medical history significant for a fib on amiodarone, Xarelto, and a pacemaker, history of hypertension and history of fall in February with vertebral fracture on nonsteroidals and hydrocodone for pain control, comes in for rectal bleeding and syncopal episode.     Assessment & Plan:   Active Problems:   Hypothyroidism   Dyslipidemia   Essential hypertension   Atrial fibrillation (HCC)   Chronic anticoagulation   SSS (sick sinus syndrome) (HCC)   CHF (congestive heart failure) (HCC)   Syncope   Syncope following a BM: Possibly vaso vagal syncope.  Pt has pacemaker  Sec to SSS.  PT evaluation recommending SNF.    Rectal bleed:  Unclear etiology. Hemoccult positive. Drop in hemoglobin to 9 from baseline of 12.  Transfuse to keep hemoglobin greater than 7.  Holding anti coagulation.  Pt does not want any endoscopic procedures at this time  GI on board and recommended getting CT abd and pelvis, which is pending.  Continue with IV PPI.    Right flank pain:  Unclear etiology.  US renal does not show any obstruction.    PAF: Rate controlled.  Echo ordered.  Not on anti coagulation.    Elevated troponin: Flat trend   Hypothyroidism: Resume synthroid.   Upper back pain : Ordered bone scan to evaluate for acuity of the compression fractures.  Requested IR for evaluation of kyphoplasty.     DVT prophylaxis: scd's Code Status: full code.  Family Communication: discussed with daughter at bedside Disposition Plan: pending resolution of back pain and rectal bleeding.    Consultants:   GI.    Procedures: CT abd and pelvis.    Antimicrobials: none.    Subjective: Reports rigth flank pain, some back pain no nausea, vomiting or rectal bleeding.   Objective: Vitals:   06/24/17 1357  06/24/17 2027 06/25/17 0527 06/25/17 1427  BP: 117/65 122/61 137/73 (!) 152/78  Pulse: 71 69 68 70  Resp: 18 16 18 20   Temp: 98.1 F (36.7 C) 97.6 F (36.4 C) 98.1 F (36.7 C) 97.8 F (36.6 C)  TempSrc: Oral Oral Oral Oral  SpO2: 100% 98% 98% 96%  Weight:      Height:        Intake/Output Summary (Last 24 hours) at 06/25/2017 1900 Last data filed at 06/25/2017 1700 Gross per 24 hour  Intake 1076.25 ml  Output 600 ml  Net 476.25 ml   Filed Weights   06/22/2017 1700 07/01/2017 2359  Weight: 59 kg (130 lb) 55.4 kg (122 lb 2.2 oz)    Examination:  General exam: Appears calm and comfortable  Respiratory system: Clear to auscultation. Respiratory effort normal. Cardiovascular system: S1 & S2 heard, RRR. No JVD,  No pedal edema. Gastrointestinal system: right flank pain, abd is soft non tender non distended bowel sounds heard.  Central nervous system: Alert and oriented to place and person not to time.  Extremities: Symmetric 5 x 5 power. Skin: No rashes, lesions or ulcers Psychiatry:  Mood & affect appropriate.     Data Reviewed: I have personally reviewed following labs and imaging studies  CBC: Recent Labs  Lab 06/16/2017 1735 06/24/17 0018 06/24/17 0557 06/24/17 0816 06/24/17 1224 06/25/17 0459  WBC 9.3  --  8.9  --   --  7.2  NEUTROABS 8.3*  --   --   --   --   --  HGB 12.2 9.9* 9.4* 10.6* 10.0* 8.9*  HCT 37.9 31.3* 29.5*  --   --  27.7*  MCV 102.2*  --  103.1*  --   --  103.0*  PLT 121*  --  83*  --   --  85*   Basic Metabolic Panel: Recent Labs  Lab 06/20/2017 1735 06/24/17 1224 06/25/17 0459  NA 140 142 142  K 3.9 3.5 3.4*  CL 102 109 111  CO2 26 23 21*  GLUCOSE 145* 93 74  BUN 33* 34* 32*  CREATININE 1.83* 1.98* 1.60*  CALCIUM 10.2 8.8* 8.7*   GFR: Estimated Creatinine Clearance: 17.6 mL/min (A) (by C-G formula based on SCr of 1.6 mg/dL (H)). Liver Function Tests: Recent Labs  Lab 06/20/2017 1735 06/25/17 0459  AST 360* 263*  ALT 326* 253*    ALKPHOS 185* 139*  BILITOT 1.6* 1.4*  PROT 6.8 4.9*  ALBUMIN 2.9* 2.1*   No results for input(s): LIPASE, AMYLASE in the last 168 hours. No results for input(s): AMMONIA in the last 168 hours. Coagulation Profile: Recent Labs  Lab 07/13/2017 1735  INR 1.04   Cardiac Enzymes: Recent Labs  Lab 06/22/2017 1735 06/24/17 0018 06/24/17 0557 06/24/17 1224  TROPONINI 0.05* 0.04* 0.05* 0.04*   BNP (last 3 results) No results for input(s): PROBNP in the last 8760 hours. HbA1C: No results for input(s): HGBA1C in the last 72 hours. CBG: Recent Labs  Lab 06/25/2017 1754  GLUCAP 135*   Lipid Profile: No results for input(s): CHOL, HDL, LDLCALC, TRIG, CHOLHDL, LDLDIRECT in the last 72 hours. Thyroid Function Tests: No results for input(s): TSH, T4TOTAL, FREET4, T3FREE, THYROIDAB in the last 72 hours. Anemia Panel: No results for input(s): VITAMINB12, FOLATE, FERRITIN, TIBC, IRON, RETICCTPCT in the last 72 hours. Sepsis Labs: No results for input(s): PROCALCITON, LATICACIDVEN in the last 168 hours.  No results found for this or any previous visit (from the past 240 hour(s)).       Radiology Studies: US Renal  Result Date: 06/25/2017 CLINICAL DATA:  Acute kidney injury. EXAM: RENAL / URINARY TRACT ULTRASOUND COMPLETE COMPARISON:  Ultrasound of the abdomen 05/29/2017 only examined the RIGHT upper quadrant. FINDINGS: Right Kidney: Length: 8.8 cm. Echogenicity within normal limits. No mass or hydronephrosis visualized. Left Kidney: Length: 8.3 cm. Echogenicity within normal limits. No mass or hydronephrosis visualized. Bladder: Decompressed by Foley catheter. IMPRESSION: Negative exam.  No hydronephrosis or abnormal echogenicity. Electronically Signed   By: Staci Righter M.D.   On: 06/25/2017 11:42        Scheduled Meds: . amiodarone  200 mg Oral BID  . chlorhexidine  15 mL Mouth Rinse BID  . docusate sodium  100 mg Oral BID  . levothyroxine  75 mcg Oral QAC breakfast  .  lidocaine  1 patch Transdermal Q24H  . mouth rinse  15 mL Mouth Rinse q12n4p  . metoprolol succinate  25 mg Oral BID  . pantoprazole  40 mg Oral Daily  . senna  1 tablet Oral BID   Continuous Infusions: . sodium chloride Stopped (06/25/17 0400)  . sodium chloride 75 mL/hr at 06/25/17 0400  . sodium chloride Stopped (06/24/17 0927)     LOS: 2 days    Time spent: 35 minutes    Hosie Poisson, MD [facility] Pager [phone]  If 7PM-7AM, please contact night-coverage [email or web address] Password [email or web address] 06/25/2017, 7:00 PM

## 2017-06-25 NOTE — Progress Notes (Addendum)
Patient ID: Christine Perez, female   DOB: 06/10/1925, 84 y.o.   MRN: [id number]    Progress Note   Subjective    No GI complaints - back hurting - was unable to stand due to back ain last pm NO BM's or further bleeding. No c/o abdominal  pain- not much appetite HGB 8.9   Objective   Vital signs in last 24 hours: Temp:  [97.6 F (36.4 C)-98.1 F (36.7 C)] 98.1 F (36.7 C) (04/11 0527) Pulse Rate:  [68-71] 68 (04/11 0527) Resp:  [16-18] 18 (04/11 0527) BP: (117-137)/(61-73) 137/73 (04/11 0527) SpO2:  [98 %-100 %] 98 % (04/11 0527)   General:  elderly  white female in NAD, frail  Heart:  Regular rate and rhythm; no murmurs Lungs: Respirations even and unlabored, lungs CTA bilaterally Abdomen:  Soft, nontender and nondistended. Normal bowel sounds. Extremities:  Without edema. Neurologic:  Alert and oriented,  grossly normal neurologically. Psych:  Cooperative. Normal mood and affect.  Intake/Output from previous day: 04/10 0701 - 04/11 0700 In: 2979.6 [P.O.:90; I.V.:2889.6] Out: 300 [Urine:300] Intake/Output this shift: No intake/output data recorded.  Lab Results: Recent Labs    07/10/2017 1735 06/24/17 0018 06/24/17 0557 06/24/17 0816 06/24/17 1224 06/25/17 0459  WBC 9.3  --  8.9  --   --  7.2  HGB 12.2 9.9* 9.4* 10.6* 10.0* 8.9*  HCT 37.9 31.3* 29.5*  --   --  27.7*  PLT 121*  --  83*  --   --  85*   BMET Recent Labs    06/19/2017 1735 06/24/17 1224 06/25/17 0459  NA 140 142 142  K 3.9 3.5 3.4*  CL 102 109 111  CO2 26 23 21*  GLUCOSE 145* 93 74  BUN 33* 34* 32*  CREATININE 1.83* 1.98* 1.60*  CALCIUM 10.2 8.8* 8.7*   LFT Recent Labs    06/25/17 0459  PROT 4.9*  ALBUMIN 2.1*  AST 263*  ALT 253*  ALKPHOS 139*  BILITOT 1.4*  BILIDIR 0.8*  IBILI 0.6   PT/INR Recent Labs    07/03/2017 1735  LABPROT 13.5  INR 1.04    Studies/Results: Dg Lumbar Spine Complete  Result Date: 06/16/2017 CLINICAL DATA:  Low back pain. Right flank pain. Syncopal  episode. Initial encounter. EXAM: LUMBAR SPINE - COMPLETE 4+ VIEW COMPARISON:  05/14/2017 FINDINGS: There is transitional lumbosacral anatomy, and the transitional segment will be considered a partially lumbarized S1 for consistency with the prior study. The bones are diffusely osteopenic. An L3 compression fracture is again seen with similar severe vertebral body height loss. A mild T12 compression fracture is less well profiled on today's lateral radiograph than on the prior study but is without grossly progressive vertebral body height loss. No new compression fracture is identified. Mild upper lumbar levoscoliosis is noted. No significant listhesis is seen. Disc space narrowing at L2-3 is similar to the prior study. Mild lower lumbar facet arthrosis is noted. There is aortic atherosclerosis. IMPRESSION: Chronic lumbar and lower thoracic compression fractures without evidence of acute osseous abnormality. Electronically Signed   By: Logan Bores M.D.   On: 06/28/2017 18:39   Dg Abdomen Acute W/chest  Result Date: 06/21/2017 CLINICAL DATA:  84 year old female status post syncopal episode while using the bathroom. Lower abdominal pain. EXAM: DG ABDOMEN ACUTE W/ 1V CHEST COMPARISON:  Chest radiographs 06/10/2017 and earlier. Lumbar radiographs 05/14/2017. FINDINGS: Semi upright AP view of the chest. Stable cardiomegaly and mediastinal contours. Stable left chest cardiac pacemaker. Calcified aortic  atherosclerosis. Stable lung volumes. No pneumothorax, pulmonary edema or acute pulmonary opacity. Chronic blunting of the right costophrenic angle. No pneumoperitoneum. Non obstructed bowel gas pattern. Left-side-down lateral decubitus view of the abdomen also demonstrates no free air. Stable visualized osseous structures, including osteopenia and L3 compression fracture. A round 7-8 millimeter calcification projects to the right of the mid lumbar spine and is stable since February. IMPRESSION: 1.  No acute  cardiopulmonary abnormality. 2. Normal bowel gas pattern, no free air. 3. A 7-8 mm right abdominal calcification is stable since February and of uncertain etiology and significance. Correlate for hematuria. Electronically Signed   By: Genevie Ann M.D.   On: 06/16/2017 18:36       Assessment / Plan:    #1 84 yo WF admitted after syncope at home, one episode of bloody stool  She has not had any evidence of ongoing GI bleed since admit though HGB has drifted a bit. No c/o abd pain  Unclear etiology of bleeding - ? Diverticular/ Ischemic colitis ? Occult lesion Pt very frail , doesn't feel she could drink prep  Previously documented diverticulosis   #2 severe back pain - known compression frx's - likely will need kyphoplasty  #3 elevated LFT's - new this admit - pt was hypotensive ? Mild ischemic injury vs other #4 chronic anticoagulation - Xarelto on hold #5 Afib #6 COPD  #7 hx breast CA #8 s/p GB  Plan; advance to soft diet Continue to follow hgb Will schedule for CT abd/pelvis  Tomorrow - to further  evaluate  Bowel, and elevated LFT's - creat is improving - hoping if close to normal can use at least reduced dose IV contrast     Contact  Amy Esterwood, P.A.-C               [phone]   Attending physician's note   I have taken an interval history, reviewed the chart and examined the patient. I agree with the Advanced Practitioner's note, impression and recommendations.  Discussed with the patient's daughter.  No further bleeding.  Patient still with right flank pain.  Proceed with CT scan of the abdomen and pelvis with p.o. and IV contrast for tomorrow.  Patient's daughter had some concerns about oropharyngeal dysphagia.  Will have speech evaluation.  Patient would like to hold off on any endoscopic evaluation. Trend CBC  Carmell Austria, MD

## 2017-06-25 NOTE — Progress Notes (Signed)
Pt with no urinary output during the night. Bladder scan performed 360 noted. MD paged and updated. Order to insert foley catheter given for acute urinary retention.

## 2017-06-26 ENCOUNTER — Inpatient Hospital Stay (HOSPITAL_COMMUNITY): Payer: Medicare Other

## 2017-06-26 ENCOUNTER — Encounter (HOSPITAL_COMMUNITY): Payer: Self-pay | Admitting: Radiology

## 2017-06-26 DIAGNOSIS — R1084 Generalized abdominal pain: Secondary | ICD-10-CM

## 2017-06-26 DIAGNOSIS — D62 Acute posthemorrhagic anemia: Secondary | ICD-10-CM

## 2017-06-26 DIAGNOSIS — S2239XA Fracture of one rib, unspecified side, initial encounter for closed fracture: Secondary | ICD-10-CM | POA: Diagnosis present

## 2017-06-26 LAB — CBC
HEMATOCRIT: 31.5 % — AB (ref 36.0–46.0)
Hemoglobin: 10 g/dL — ABNORMAL LOW (ref 12.0–15.0)
MCH: 33 pg (ref 26.0–34.0)
MCHC: 31.7 g/dL (ref 30.0–36.0)
MCV: 104 fL — AB (ref 78.0–100.0)
PLATELETS: 108 10*3/uL — AB (ref 150–400)
RBC: 3.03 MIL/uL — ABNORMAL LOW (ref 3.87–5.11)
RDW: 17.8 % — AB (ref 11.5–15.5)
WBC: 9.4 10*3/uL (ref 4.0–10.5)

## 2017-06-26 LAB — COMPREHENSIVE METABOLIC PANEL
ALBUMIN: 2.5 g/dL — AB (ref 3.5–5.0)
ALT: 282 U/L — AB (ref 14–54)
AST: 267 U/L — AB (ref 15–41)
Alkaline Phosphatase: 170 U/L — ABNORMAL HIGH (ref 38–126)
Anion gap: 10 (ref 5–15)
BILIRUBIN TOTAL: 1.4 mg/dL — AB (ref 0.3–1.2)
BUN: 30 mg/dL — AB (ref 6–20)
CO2: 19 mmol/L — ABNORMAL LOW (ref 22–32)
CREATININE: 1.37 mg/dL — AB (ref 0.44–1.00)
Calcium: 9.4 mg/dL (ref 8.9–10.3)
Chloride: 113 mmol/L — ABNORMAL HIGH (ref 101–111)
GFR calc Af Amer: 38 mL/min — ABNORMAL LOW (ref >60)
GFR calc non Af Amer: 33 mL/min — ABNORMAL LOW (ref >60)
GLUCOSE: 103 mg/dL — AB (ref 65–99)
POTASSIUM: 4.4 mmol/L (ref 3.5–5.1)
Sodium: 142 mmol/L (ref 135–145)
Total Protein: 5.7 g/dL — ABNORMAL LOW (ref 6.5–8.1)

## 2017-06-26 MED ORDER — IOHEXOL 300 MG/ML  SOLN
75.0000 mL | Freq: Once | INTRAMUSCULAR | Status: AC | PRN
  Administered 2017-06-26: 75 mL via INTRAVENOUS

## 2017-06-26 MED ORDER — IOPAMIDOL (ISOVUE-300) INJECTION 61%
INTRAVENOUS | Status: AC
  Administered 2017-06-26: 12:00:00
  Filled 2017-06-26: qty 30

## 2017-06-26 MED ORDER — TECHNETIUM TC 99M MEDRONATE IV KIT
20.3000 | PACK | Freq: Once | INTRAVENOUS | Status: AC | PRN
  Administered 2017-06-26: 20.3 via INTRAVENOUS

## 2017-06-26 MED ORDER — IOPAMIDOL (ISOVUE-300) INJECTION 61%: 15.0000 mL | Freq: Once | INTRAVENOUS | Status: AC | PRN

## 2017-06-26 MED ORDER — HYDROCODONE-ACETAMINOPHEN 5-325 MG PO TABS
1.0000 | ORAL_TABLET | Freq: Once | ORAL | Status: AC
  Administered 2017-06-26: 1 via ORAL
  Filled 2017-06-26: qty 1

## 2017-06-26 NOTE — Consult Note (Signed)
Chief Complaint: Patient was seen in consultation today for possible T12 vertebral augmentation Chief Complaint  Patient presents with  . Loss of Consciousness    Referring Physician(s): Akula,V  Supervising Physician: Jacqulynn Cadet  Patient Status: [facility] - [facility]  History of Present Illness: Christine Perez is an 84 y.o. female with history of atrial fibrillation, on amiodarone and Xarelto, prior pacemaker placement, COPD, hypertension, hypothyroidism, and osteoporosis who suffered a fall in February of this year with subsequent vertebral fractures.  She has since been on nonsteroidals and hydrocodone for pain control but continues to experience significant back/flank pain.  She recently presented to [facility] with rectal bleeding and syncopal episode.  Patient has refused endoscopic procedures.  Xarelto has been held.  Imaging has shown healing subacute anterior right seventh and eighth rib fractures, moderate to severe T12 vertebral compression fracture with worsening of height since February of this year as well as chronic severe L3 vertebral fracture.  Prior to fall patient was functioning independently according to family.  Bone scan performed today shows marked abnormal activity involving T12 suggesting acute or subacute component to previous fracture as well as mild abnormal activity involving L3, likely related to healing of remote compression fracture at this level.  Request now received from primary care team for consideration of T12 kyphoplasty.  Past Medical History:  Diagnosis Date  . Atrial fibrillation ([place])   . CARDIAC ARRHYTHMIA 01/08/2010  . COLONIC POLYPS, HX OF 10/09/2006  . COPD (chronic obstructive pulmonary disease) ([place])   . HYPERLIPIDEMIA 10/09/2006  . HYPERTENSION 10/09/2006  . HYPOTHYROIDISM 10/09/2006  . LASSITUDE 08/15/2009  . OSTEOPOROSIS 10/09/2006  . VERTIGO 08/07/2009  . Wears glasses   . Wears hearing aid    left    Past Surgical  History:  Procedure Laterality Date  . ABDOMINAL HYSTERECTOMY    . APPENDECTOMY    . BREAST SURGERY     left - lumpectomy  . CARDIOVERSION N/A 08/09/2013   Procedure: CARDIOVERSION;  Surgeon: Candee Furbish, MD;  Location: [facility] ENDOSCOPY;  Service: Cardiovascular;  Laterality: N/A;  . CATARACT EXTRACTION    . CHOLECYSTECTOMY    . PACEMAKER PLACEMENT N/A 12/05/2013  . PARTIAL MASTECTOMY WITH NEEDLE LOCALIZATION Left 06/29/2012   Procedure: PARTIAL MASTECTOMY WITH NEEDLE LOCALIZATION;  Surgeon: Adin Hector, MD;  Location: [place];  Service: General;  Laterality: Left;  . TONSILLECTOMY    . YAG LASER APPLICATION      Allergies: Codeine and Tramadol  Medications: Prior to Admission medications   Medication Sig Start Date End Date Taking? Authorizing Provider  amiodarone (PACERONE) 200 MG tablet TAKE 1 TABLET BY MOUTH TWICE DAILY 03/09/17  Yes Troy Sine, MD  calcium-vitamin D (OSCAL WITH D) 500-200 MG-UNIT per tablet Take 1 tablet by mouth 2 (two) times daily.    Yes [provider]  cholecalciferol (VITAMIN D) 1000 units tablet Take 1,000 Units by mouth daily.   Yes [provider]  furosemide (LASIX) 20 MG tablet Take 1 tablet (20 mg total) by mouth daily. 08/12/16  Yes Marletta Lor, MD  HYDROcodone-acetaminophen (HYCET) 7.5-325 mg/15 ml solution Take 5 mLs by mouth every 6 (six) hours as needed for moderate pain or severe pain. 06/18/17  Yes Marletta Lor, MD  losartan (COZAAR) 100 MG tablet TAKE 1 TABLET BY MOUTH ONCE DAILY 03/16/17  Yes Marletta Lor, MD  metoprolol succinate (TOPROL-XL) 50 MG 24 hr tablet TAKE 1 TABLET BY MOUTH TWICE DAILY 04/20/17  Yes Troy Sine, MD  Multiple Vitamin (MULTIVITAMIN) tablet Take 1 tablet by mouth daily.   Yes [provider]  ondansetron (ZOFRAN) 4 MG tablet Take 1 tablet (4 mg total) by mouth every 8 (eight) hours as needed for nausea or vomiting. 06/18/17  Yes Marletta Lor,  MD  spironolactone (ALDACTONE) 25 MG tablet Take 12.5 mg by mouth daily. 01/14/17  Yes [provider]  SYNTHROID 75 MCG tablet TAKE 1 TABLET BY MOUTH ONCE DAILY 02/16/17  Yes Marletta Lor, MD  vitamin C (ASCORBIC ACID) 500 MG tablet Take 500 mg by mouth daily.   Yes [provider]  vitamin E 100 UNIT capsule Take 100 Units by mouth daily.   Yes [provider]  XARELTO 15 MG TABS tablet TAKE 1 TABLET BY MOUTH ONCE DAILY WITH SUPPER 06/18/17  Yes Marletta Lor, MD  acetaminophen (TYLENOL) 500 MG tablet Take 500 mg by mouth every 6 (six) hours as needed for moderate pain or headache.    [provider]  amiodarone (PACERONE) 200 MG tablet Take 1.5 tablets (300 mg total) by mouth daily. Patient not taking: Reported on 06/10/2017 08/04/16   Troy Sine, MD  diclofenac (FLECTOR) 1.3 % PTCH Place 1 patch onto the skin 2 (two) times daily. Patient not taking: Reported on 06/10/2017 05/14/17   Carmin Muskrat, MD  diclofenac sodium (VOLTAREN) 1 % GEL Apply 4 g topically 4 (four) times daily. Patient not taking: Reported on 06/10/2017 05/03/17   Blanchie Dessert, MD  ipratropium (ATROVENT HFA) 17 MCG/ACT inhaler Inhale 2 puffs into the lungs every 4 (four) hours as needed for wheezing. 03/18/16   Marletta Lor, MD  lidocaine (LIDODERM) 5 % Place 1 patch onto the skin daily. Remove & Discard patch within 12 hours or as directed by MD Patient not taking: Reported on 06/10/2017 05/03/17   Blanchie Dessert, MD  lidocaine (LIDODERM) 5 % Place 1 patch onto the skin daily. Remove & Discard patch within 12 hours or as directed by MD Patient not taking: Reported on 06/10/2017 05/11/17   Marletta Lor, MD  traMADol (ULTRAM) 50 MG tablet Take 1 tablet (50 mg total) by mouth 2 (two) times daily as needed (pain). Patient not taking: Reported on 06/10/2017 05/28/17   Marletta Lor, MD  zolpidem (AMBIEN) 5 MG tablet TAKE 1 TABLET BY MOUTH AT  BEDTIME Patient not taking: Reported on 06/10/2017 05/13/17   Marletta Lor, MD     Family History  Problem Relation Age of Onset  . Cancer Mother   . Heart failure Father   . Cancer Sister   . Stroke Sister     Social History   Socioeconomic History  . Marital status: Widowed    Spouse name: Not on file  . Number of children: Not on file  . Years of education: Not on file  . Highest education level: Not on file  Occupational History  . Not on file  Social Needs  . Financial resource strain: Not on file  . Food insecurity:    Worry: Not on file    Inability: Not on file  . Transportation needs:    Medical: Not on file    Non-medical: Not on file  Tobacco Use  . Smoking status: Never Smoker  . Smokeless tobacco: Never Used  Substance and Sexual Activity  . Alcohol use: No  . Drug use: No  . Sexual activity: Not Currently  Lifestyle  . Physical activity:  Days per week: Not on file    Minutes per session: Not on file  . Stress: Not on file  Relationships  . Social connections:    Talks on phone: Not on file    Gets together: Not on file    Attends religious service: Not on file    Active member of club or organization: Not on file    Attends meetings of clubs or organizations: Not on file    Relationship status: Not on file  Other Topics Concern  . Not on file  Social History Narrative  . Not on file      Review of Systems see above; denies fever, headache, chest pain, cough, abdominal pain, nausea, vomiting; does have some dyspnea especially with exertion.  Vital Signs: BP (!) 161/74 (BP Location: Left Arm)   Pulse 69   Temp 98.5 F (36.9 C) (Oral)   Resp 14   Ht 4\' 10"  (1.473 m)   Wt 122 lb 2.2 oz (55.4 kg)   SpO2 96%   BMI 25.53 kg/m   Physical Exam awake, alert.  Chest with diminished breath sounds at bases, heart with normal rate, paced rhythm.  Abdomen soft, positive bowel sounds, nontender; no lower extremity edema.  There is mild to  moderate T12 paravertebral tenderness with radiation to right flank  Imaging: Dg Chest 2 View  Result Date: 06/10/2017 CLINICAL DATA:  Weakness EXAM: CHEST - 2 VIEW COMPARISON:  05/03/2017, 08/01/2016 FINDINGS: Trace pleural effusion. No focal consolidation. Mild cardiomegaly with aortic atherosclerosis. Left-sided pacing device. Kyphosis of the spine with marked compression fracture of lower thoracic vertebra and upper lumbar vertebra. IMPRESSION: 1. Mild cardiomegaly with trace pleural effusion or thickening. No focal opacity 2. Marked compression fracture deformities at the lower thoracic and upper lumbar spine Electronically Signed   By: Donavan Foil M.D.   On: 06/10/2017 21:53   Dg Lumbar Spine Complete  Result Date: 07/14/2017 CLINICAL DATA:  Low back pain. Right flank pain. Syncopal episode. Initial encounter. EXAM: LUMBAR SPINE - COMPLETE 4+ VIEW COMPARISON:  05/14/2017 FINDINGS: There is transitional lumbosacral anatomy, and the transitional segment will be considered a partially lumbarized S1 for consistency with the prior study. The bones are diffusely osteopenic. An L3 compression fracture is again seen with similar severe vertebral body height loss. A mild T12 compression fracture is less well profiled on today's lateral radiograph than on the prior study but is without grossly progressive vertebral body height loss. No new compression fracture is identified. Mild upper lumbar levoscoliosis is noted. No significant listhesis is seen. Disc space narrowing at L2-3 is similar to the prior study. Mild lower lumbar facet arthrosis is noted. There is aortic atherosclerosis. IMPRESSION: Chronic lumbar and lower thoracic compression fractures without evidence of acute osseous abnormality. Electronically Signed   By: Logan Bores M.D.   On: 07/10/2017 18:39   Ct Head Wo Contrast  Result Date: 06/10/2017 CLINICAL DATA:  Generalized weakness beginning last night, nausea and vomiting. History of atrial  fibrillation, hypertension and hyperlipidemia. EXAM: CT HEAD WITHOUT CONTRAST TECHNIQUE: Contiguous axial images were obtained from the base of the skull through the vertex without intravenous contrast. COMPARISON:  CT HEAD May 03, 2017 FINDINGS: BRAIN: No intraparenchymal hemorrhage, mass effect nor midline shift. Old mesial RIGHT occipital lobe encephalomalacia. Old bilateral basal ganglia lacunar infarcts. Patchy to confluent supratentorial white matter hypodensities. No abnormal extra-axial fluid collections. Basal cisterns are patent. VASCULAR: Mild calcific atherosclerosis of the carotid siphons. SKULL: No skull fracture. No  significant scalp soft tissue swelling. SINUSES/ORBITS: The mastoid air-cells and included paranasal sinuses are well-aerated.The included ocular globes and orbital contents are non-suspicious. Status post bilateral ocular lens implants. OTHER: None. IMPRESSION: 1. No acute intracranial process. 2. Stable examination including moderate chronic small vessel ischemic disease and old RIGHT occipital lobe/PCA territory infarct. Electronically Signed   By: Elon Alas M.D.   On: 06/10/2017 22:06   Nm Bone Scan Whole Body  Result Date: 06/26/2017 CLINICAL DATA:  84 year old who fell in February, 2019, with impact to the low back and ribs. Patient states that she has had prior compression fractures and rib fractures. EXAM: NUCLEAR MEDICINE WHOLE BODY BONE SCAN TECHNIQUE: Whole body anterior and posterior images were obtained approximately 3 hours after intravenous injection of radiopharmaceutical. RADIOPHARMACEUTICALS:  20.2 mCi Technetium-14m MDP IV COMPARISON:  No prior nuclear bone scan. Bone window images from CT abdomen and pelvis performed earlier same date. Lumbar spine x-rays 07/11/2017, 05/14/2017. FINDINGS: Symmetric marked focal abnormal activity involving the POSTERIOR elements of T12 bilaterally. Symmetric mild focal abnormal activity involving the POSTERIOR elements  of L3 bilaterally. Prior imaging has demonstrated chronic compression fractures at both levels. Degenerative activity involving the sternoclavicular joints, the acromioclavicular joint, the junction of the sternal body and manubrium, the bilateral first MTP joints. IMPRESSION: 1. Marked abnormal activity involving T12. The patient has a chronic T12 compression fracture, though the abnormal activity on this examination suggests that there is an acute or subacute component. 2. Mild abnormal activity involving L3, likely related to healing of the remote compression fracture at this level. 3. No abnormal activity elsewhere to suggest acute or subacute fractures. Electronically Signed   By: Evangeline Dakin M.D.   On: 06/26/2017 14:22   Ct Abdomen Pelvis W Contrast  Result Date: 06/26/2017 CLINICAL DATA:  Inpatient. Right abdominal and back pain. Nausea. Weight loss. Anorexia. Constipation. EXAM: CT ABDOMEN AND PELVIS WITH CONTRAST TECHNIQUE: Multidetector CT imaging of the abdomen and pelvis was performed using the standard protocol following bolus administration of intravenous contrast. CONTRAST:  64mL OMNIPAQUE IOHEXOL 300 MG/ML  SOLN COMPARISON:  06/25/2017 renal sonogram. 05/29/2017 abdominal sonogram. FINDINGS: Lower chest: Small dependent bilateral pleural effusions, right greater than left. Moderate dependent atelectasis at both lung bases. Cardiomegaly. Pacer leads are seen in the right atrium and right ventricular apex. Coronary atherosclerosis. Hepatobiliary: Normal liver size. No liver mass. Cholecystectomy. Bile ducts are within normal post cholecystectomy limits with CBD diameter 9 mm. No radiopaque choledocholithiasis. Pancreas: Normal, with no mass or duct dilation. Spleen: Normal size. No mass. Adrenals/Urinary Tract: Normal adrenals. No hydronephrosis. Subcentimeter hypodense renal cortical lesions in the upper left kidney are too small to characterize and require no follow-up. Bladder collapsed by  indwelling Foley catheter and appears normal. Stomach/Bowel: Normal non-distended stomach. Normal caliber small bowel with no small bowel wall thickening. Appendectomy. Collapsed large bowel. Moderate sigmoid diverticulosis. Questionable mild wall thickening in the right colon without significant pericolonic fat stranding. No additional sites of large bowel wall thickening or significant pericolonic fat stranding. Vascular/Lymphatic: Atherosclerotic nonaneurysmal abdominal aorta. Patent portal, splenic, hepatic and renal veins. No pathologically enlarged lymph nodes in the abdomen or pelvis. Reproductive: Status post hysterectomy, with no abnormal findings at the vaginal cuff. No adnexal mass. Other: No pneumoperitoneum, ascites or focal fluid collection. Nonspecific ill-defined mild presacral free fluid. Mild anasarca. Musculoskeletal: No aggressive appearing focal osseous lesions. Subacute healing anterior right seventh and eighth rib fractures. Moderate to severe T12 vertebral compression fracture demonstrates mild worsening of vertebral body  height loss since 05/14/2017 radiographs. Severe L3 vertebral compression fracture is stable since 05/14/2017 radiographs. Mild thoracolumbar spondylosis. IMPRESSION: 1. Healing subacute anterior right seventh and eighth rib fractures. 2. Moderate to severe T12 vertebral compression fracture with mild worsening of vertebral body height loss since 05/14/2017 radiographs. Chronic severe L3 vertebral compression fracture is stable. 3. Third-spacing of fluid with small bilateral pleural effusions, mild anasarca and mild presacral fluid. Cardiomegaly. 4. Mild right colonic wall thickening may be due to hypoalbuminemia. Mild right colitis cannot be strictly excluded. No bowel obstruction. 5. Chronic findings include: Aortic Atherosclerosis (ICD10-I70.0). Coronary atherosclerosis. Moderate sigmoid diverticulosis. Electronically Signed   By: Ilona Sorrel M.D.   On: 06/26/2017 12:51     US Renal  Result Date: 06/25/2017 CLINICAL DATA:  Acute kidney injury. EXAM: RENAL / URINARY TRACT ULTRASOUND COMPLETE COMPARISON:  Ultrasound of the abdomen 05/29/2017 only examined the RIGHT upper quadrant. FINDINGS: Right Kidney: Length: 8.8 cm. Echogenicity within normal limits. No mass or hydronephrosis visualized. Left Kidney: Length: 8.3 cm. Echogenicity within normal limits. No mass or hydronephrosis visualized. Bladder: Decompressed by Foley catheter. IMPRESSION: Negative exam.  No hydronephrosis or abnormal echogenicity. Electronically Signed   By: Staci Righter M.D.   On: 06/25/2017 11:42   Dg Abdomen Acute W/chest  Result Date: 06/20/2017 CLINICAL DATA:  84 year old female status post syncopal episode while using the bathroom. Lower abdominal pain. EXAM: DG ABDOMEN ACUTE W/ 1V CHEST COMPARISON:  Chest radiographs 06/10/2017 and earlier. Lumbar radiographs 05/14/2017. FINDINGS: Semi upright AP view of the chest. Stable cardiomegaly and mediastinal contours. Stable left chest cardiac pacemaker. Calcified aortic atherosclerosis. Stable lung volumes. No pneumothorax, pulmonary edema or acute pulmonary opacity. Chronic blunting of the right costophrenic angle. No pneumoperitoneum. Non obstructed bowel gas pattern. Left-side-down lateral decubitus view of the abdomen also demonstrates no free air. Stable visualized osseous structures, including osteopenia and L3 compression fracture. A round 7-8 millimeter calcification projects to the right of the mid lumbar spine and is stable since February. IMPRESSION: 1.  No acute cardiopulmonary abnormality. 2. Normal bowel gas pattern, no free air. 3. A 7-8 mm right abdominal calcification is stable since February and of uncertain etiology and significance. Correlate for hematuria. Electronically Signed   By: Genevie Ann M.D.   On: 06/29/2017 18:36   US Abdomen Limited Ruq  Result Date: 05/29/2017 CLINICAL DATA:  Elevated liver enzymes EXAM: ULTRASOUND ABDOMEN  LIMITED RIGHT UPPER QUADRANT COMPARISON:  None. FINDINGS: Gallbladder: Surgically absent. Common bile duct: Diameter: 9 mm. No intrahepatic biliary duct dilatation. No biliary duct mass or calculus. Liver: No focal lesion identified. Liver echogenicity overall is increased. Portal vein is patent on color Doppler imaging with normal direction of blood flow towards the liver. There is trace fluid in the hepatorenal fossa on the right. Right kidney appears echogenic. IMPRESSION: 1. Gallbladder absent. Common bile duct measures 9 mm which may be within normal limits for post cholecystectomy state. No biliary duct mass or calculus evident. 2. Increase in liver echogenicity, a finding most likely indicative of a degree of hepatic steatosis. While no focal liver lesions are evident, it must be cautioned that the sensitivity of ultrasound for detection of focal liver lesions is diminished in this circumstance. 3.  Trace ascites. 4. Increase in right renal echogenicity, a finding that may be associated with medical renal disease. Appropriate renal laboratory studies advised. Electronically Signed   By: Lowella Grip III M.D.   On: 05/29/2017 15:41    Labs:  CBC: Recent Labs  07/05/2017 1735 06/24/17 0018 06/24/17 0557 06/24/17 0816 06/24/17 1224 06/25/17 0459 06/26/17 0540  WBC 9.3  --  8.9  --   --  7.2 9.4  HGB 12.2 9.9* 9.4* 10.6* 10.0* 8.9* 10.0*  HCT 37.9 31.3* 29.5*  --   --  27.7* 31.5*  PLT 121*  --  83*  --   --  85* 108*    COAGS: Recent Labs    06/16/2017 1735  INR 1.04    BMP: Recent Labs    06/22/2017 1735 06/24/17 1224 06/25/17 0459 06/26/17 0540  NA 140 142 142 142  K 3.9 3.5 3.4* 4.4  CL 102 109 111 113*  CO2 26 23 21* 19*  GLUCOSE 145* 93 74 103*  BUN 33* 34* 32* 30*  CALCIUM 10.2 8.8* 8.7* 9.4  CREATININE 1.83* 1.98* 1.60* 1.37*  GFRNONAA 23* 21* 27* 33*  GFRAA 27* 25* 32* 38*    LIVER FUNCTION TESTS: Recent Labs    05/28/17 1202 07/07/2017 1735  06/25/17 0459 06/26/17 0540  BILITOT 0.5 1.6* 1.4* 1.4*  AST 115* 360* 263* 267*  ALT 155* 326* 253* 282*  ALKPHOS 147* 185* 139* 170*  PROT 6.4 6.8 4.9* 5.7*  ALBUMIN 3.6 2.9* 2.1* 2.5*    TUMOR MARKERS: No results for input(s): AFPTM, CEA, CA199, CHROMGRNA in the last 8760 hours.  Assessment and Plan: 84 y.o. female with history of atrial fibrillation, on amiodarone and Xarelto, prior pacemaker placement, COPD, hypertension, hypothyroidism, and osteoporosis who suffered a fall in February of this year with subsequent vertebral fractures.  She has since been on nonsteroidals and hydrocodone for pain control but continues to experience significant back/flank pain.  She recently presented to [facility] with rectal bleeding and syncopal episode.  Patient has refused endoscopic procedures.  Xarelto has been held.  Imaging has shown healing subacute anterior right seventh and eighth rib fractures, moderate to severe T12 vertebral compression fracture with worsening of height since February of this year as well as chronic severe L3 vertebral fracture.  Prior to fall patient was functioning independently according to family.  Bone scan performed today shows marked abnormal activity involving T12 suggesting acute or subacute component to previous fracture as well as mild abnormal activity involving L3, likely related to healing of remote compression fracture at this level.  Request now received from primary care team for consideration of T12 kyphoplasty.  Recent imaging studies have been reviewed by Dr. Laurence Ferrari.  T12 fracture appears amenable to vertebral augmentation.  Details/risks of procedure, including but not limited to, internal bleeding, infection, nerve injury, inability to eradicate pain discussed with patient and family with their understanding and consent.  We will need to obtain insurance preauthorization prior to performing procedure and this will be initiated on 4/15.  Would  continue to hold Xarelto until after procedure. Will cont to monitor status.   Thank you for this interesting consult.  I greatly enjoyed meeting Christine Perez and look forward to participating in their care.  A copy of this report was sent to the requesting provider on this date.  Electronically Signed: D. Rowe Robert, PA-C 06/26/2017, 4:32 PM   I spent a total of 40 minutes  in face to face in clinical consultation, greater than 50% of which was counseling/coordinating care for possible T12 vertebral augmentation

## 2017-06-26 NOTE — Progress Notes (Signed)
PROGRESS NOTE    Christine Perez  NLG:[id number] DOB: 02-11-1926 DOA: 07/11/2017 PCP: Marletta Lor, MD    Brief Narrative:PollyLawrenceis a84 y.o.femalewith past medical history significant for a fib on amiodarone, Xarelto, and a pacemaker, history of hypertension and history of fall in February with vertebral fracture on nonsteroidals and hydrocodone for pain control, comes in for rectal bleeding and syncopal episode.     Assessment & Plan:   Active Problems:   Hypothyroidism   Dyslipidemia   Essential hypertension   Atrial fibrillation (HCC)   Chronic anticoagulation   SSS (sick sinus syndrome) (HCC)   CHF (congestive heart failure) (HCC)   Syncope   Syncope following a BM: Possibly vaso vagal syncope.  Pt has pacemaker  Sec to SSS.  PT evaluation recommending SNF.    Rectal bleed:  Unclear etiology. Hemoccult positive. Drop in hemoglobin to 9 from baseline of 12.  Transfuse to keep hemoglobin greater than 7.  Holding anti coagulation. Hemoglobin stabilized around 10.  Pt does not want any endoscopic procedures at this time  GI on board and recommended getting CT abd and pelvis, which showed mild colitis.  Resume PPI.    Right flank pain:  Unclear etiology.  US renal does not show any obstruction.    Right sided lower chest pain : Secondary to tib fracture pain. Discussed with the patient and her daughter at bedside.    Elevated liver function panel: Unclear etiology. ? Sec to amiodarone.  CT abd and pelvis does not show any liver abnormalities.     PAF: Rate controlled with amiodarone.  Echo ordered.  Not on anti coagulation due to rectal bleed.    Elevated troponin: Flat trend. No ischemic changes.    Hypothyroidism: Resume synthroid.   Upper back pain : Ordered bone scan to evaluate for acuity of the compression fractures.  Requested IR for evaluation of kyphoplasty.  Bone scan shows chronic T12 fracture with acute component. Will  wait for IR to see if she is a candidate for kyphoplasty.    DVT prophylaxis: scd's Code Status: full code.  Family Communication: discussed with daughter at bedside Disposition Plan: pending resolution of back pain and rectal bleeding. SNF once IR is done with kyphoplasty.    Consultants:   GI.   IR.    Procedures: CT abd and pelvis.    Antimicrobials: none.    Subjective: CONTINUES TO have right chest wall pain and back pain.   Objective: Vitals:   06/25/17 2043 06/25/17 2047 06/26/17 0509 06/26/17 1424  BP: 132/65 132/65 (!) 151/63 (!) 161/74  Pulse: 70 70 65 69  Resp:  18 18 14   Temp:  97.8 F (36.6 C) 97.8 F (36.6 C) 98.5 F (36.9 C)  TempSrc:  Oral Oral Oral  SpO2:  93% 97% 96%  Weight:      Height:        Intake/Output Summary (Last 24 hours) at 06/26/2017 1541 Last data filed at 06/26/2017 1244 Gross per 24 hour  Intake 2310 ml  Output 600 ml  Net 1710 ml   Filed Weights   06/20/2017 1700 06/26/2017 2359  Weight: 59 kg (130 lb) 55.4 kg (122 lb 2.2 oz)    Examination:  General exam: Appears calm and comfortable on oxygen.  Respiratory system: diminished at bases, no wheezing or rhonchi.  Cardiovascular system: S1 & S2 heard, RRR. No JVD,  No pedal edema. Gastrointestinal system: right flank pain, right chest wall tenderness. Bowel sounds heard.  Central  nervous system: Alert and oriented to place and person not to time.  Extremities: no cyanosis or clubbing.  Skin: No rashes, lesions or ulcers Psychiatry:  Mood & affect appropriate.     Data Reviewed: I have personally reviewed following labs and imaging studies  CBC: Recent Labs  Lab 07/03/2017 1735 06/24/17 0018 06/24/17 0557 06/24/17 0816 06/24/17 1224 06/25/17 0459 06/26/17 0540  WBC 9.3  --  8.9  --   --  7.2 9.4  NEUTROABS 8.3*  --   --   --   --   --   --   HGB 12.2 9.9* 9.4* 10.6* 10.0* 8.9* 10.0*  HCT 37.9 31.3* 29.5*  --   --  27.7* 31.5*  MCV 102.2*  --  103.1*  --   --   103.0* 104.0*  PLT 121*  --  83*  --   --  85* 397*   Basic Metabolic Panel: Recent Labs  Lab 06/29/2017 1735 06/24/17 1224 06/25/17 0459 06/26/17 0540  NA 140 142 142 142  K 3.9 3.5 3.4* 4.4  CL 102 109 111 113*  CO2 26 23 21* 19*  GLUCOSE 145* 93 74 103*  BUN 33* 34* 32* 30*  CREATININE 1.83* 1.98* 1.60* 1.37*  CALCIUM 10.2 8.8* 8.7* 9.4   GFR: Estimated Creatinine Clearance: 20.5 mL/min (A) (by C-G formula based on SCr of 1.37 mg/dL (H)). Liver Function Tests: Recent Labs  Lab 07/08/2017 1735 06/25/17 0459 06/26/17 0540  AST 360* 263* 267*  ALT 326* 253* 282*  ALKPHOS 185* 139* 170*  BILITOT 1.6* 1.4* 1.4*  PROT 6.8 4.9* 5.7*  ALBUMIN 2.9* 2.1* 2.5*   No results for input(s): LIPASE, AMYLASE in the last 168 hours. No results for input(s): AMMONIA in the last 168 hours. Coagulation Profile: Recent Labs  Lab 07/05/2017 1735  INR 1.04   Cardiac Enzymes: Recent Labs  Lab 06/15/2017 1735 06/24/17 0018 06/24/17 0557 06/24/17 1224  TROPONINI 0.05* 0.04* 0.05* 0.04*   BNP (last 3 results) No results for input(s): PROBNP in the last 8760 hours. HbA1C: No results for input(s): HGBA1C in the last 72 hours. CBG: Recent Labs  Lab 06/19/2017 1754  GLUCAP 135*   Lipid Profile: No results for input(s): CHOL, HDL, LDLCALC, TRIG, CHOLHDL, LDLDIRECT in the last 72 hours. Thyroid Function Tests: No results for input(s): TSH, T4TOTAL, FREET4, T3FREE, THYROIDAB in the last 72 hours. Anemia Panel: No results for input(s): VITAMINB12, FOLATE, FERRITIN, TIBC, IRON, RETICCTPCT in the last 72 hours. Sepsis Labs: No results for input(s): PROCALCITON, LATICACIDVEN in the last 168 hours.  No results found for this or any previous visit (from the past 240 hour(s)).       Radiology Studies: Nm Bone Scan Whole Body  Result Date: 06/26/2017 CLINICAL DATA:  84 year old who fell in February, 2019, with impact to the low back and ribs. Patient states that she has had prior  compression fractures and rib fractures. EXAM: NUCLEAR MEDICINE WHOLE BODY BONE SCAN TECHNIQUE: Whole body anterior and posterior images were obtained approximately 3 hours after intravenous injection of radiopharmaceutical. RADIOPHARMACEUTICALS:  20.2 mCi Technetium-45m MDP IV COMPARISON:  No prior nuclear bone scan. Bone window images from CT abdomen and pelvis performed earlier same date. Lumbar spine x-rays 07/14/2017, 05/14/2017. FINDINGS: Symmetric marked focal abnormal activity involving the POSTERIOR elements of T12 bilaterally. Symmetric mild focal abnormal activity involving the POSTERIOR elements of L3 bilaterally. Prior imaging has demonstrated chronic compression fractures at both levels. Degenerative activity involving the sternoclavicular joints, the  acromioclavicular joint, the junction of the sternal body and manubrium, the bilateral first MTP joints. IMPRESSION: 1. Marked abnormal activity involving T12. The patient has a chronic T12 compression fracture, though the abnormal activity on this examination suggests that there is an acute or subacute component. 2. Mild abnormal activity involving L3, likely related to healing of the remote compression fracture at this level. 3. No abnormal activity elsewhere to suggest acute or subacute fractures. Electronically Signed   By: Evangeline Dakin M.D.   On: 06/26/2017 14:22   Ct Abdomen Pelvis W Contrast  Result Date: 06/26/2017 CLINICAL DATA:  Inpatient. Right abdominal and back pain. Nausea. Weight loss. Anorexia. Constipation. EXAM: CT ABDOMEN AND PELVIS WITH CONTRAST TECHNIQUE: Multidetector CT imaging of the abdomen and pelvis was performed using the standard protocol following bolus administration of intravenous contrast. CONTRAST:  6mL OMNIPAQUE IOHEXOL 300 MG/ML  SOLN COMPARISON:  06/25/2017 renal sonogram. 05/29/2017 abdominal sonogram. FINDINGS: Lower chest: Small dependent bilateral pleural effusions, right greater than left. Moderate  dependent atelectasis at both lung bases. Cardiomegaly. Pacer leads are seen in the right atrium and right ventricular apex. Coronary atherosclerosis. Hepatobiliary: Normal liver size. No liver mass. Cholecystectomy. Bile ducts are within normal post cholecystectomy limits with CBD diameter 9 mm. No radiopaque choledocholithiasis. Pancreas: Normal, with no mass or duct dilation. Spleen: Normal size. No mass. Adrenals/Urinary Tract: Normal adrenals. No hydronephrosis. Subcentimeter hypodense renal cortical lesions in the upper left kidney are too small to characterize and require no follow-up. Bladder collapsed by indwelling Foley catheter and appears normal. Stomach/Bowel: Normal non-distended stomach. Normal caliber small bowel with no small bowel wall thickening. Appendectomy. Collapsed large bowel. Moderate sigmoid diverticulosis. Questionable mild wall thickening in the right colon without significant pericolonic fat stranding. No additional sites of large bowel wall thickening or significant pericolonic fat stranding. Vascular/Lymphatic: Atherosclerotic nonaneurysmal abdominal aorta. Patent portal, splenic, hepatic and renal veins. No pathologically enlarged lymph nodes in the abdomen or pelvis. Reproductive: Status post hysterectomy, with no abnormal findings at the vaginal cuff. No adnexal mass. Other: No pneumoperitoneum, ascites or focal fluid collection. Nonspecific ill-defined mild presacral free fluid. Mild anasarca. Musculoskeletal: No aggressive appearing focal osseous lesions. Subacute healing anterior right seventh and eighth rib fractures. Moderate to severe T12 vertebral compression fracture demonstrates mild worsening of vertebral body height loss since 05/14/2017 radiographs. Severe L3 vertebral compression fracture is stable since 05/14/2017 radiographs. Mild thoracolumbar spondylosis. IMPRESSION: 1. Healing subacute anterior right seventh and eighth rib fractures. 2. Moderate to severe T12  vertebral compression fracture with mild worsening of vertebral body height loss since 05/14/2017 radiographs. Chronic severe L3 vertebral compression fracture is stable. 3. Third-spacing of fluid with small bilateral pleural effusions, mild anasarca and mild presacral fluid. Cardiomegaly. 4. Mild right colonic wall thickening may be due to hypoalbuminemia. Mild right colitis cannot be strictly excluded. No bowel obstruction. 5. Chronic findings include: Aortic Atherosclerosis (ICD10-I70.0). Coronary atherosclerosis. Moderate sigmoid diverticulosis. Electronically Signed   By: Ilona Sorrel M.D.   On: 06/26/2017 12:51   US Renal  Result Date: 06/25/2017 CLINICAL DATA:  Acute kidney injury. EXAM: RENAL / URINARY TRACT ULTRASOUND COMPLETE COMPARISON:  Ultrasound of the abdomen 05/29/2017 only examined the RIGHT upper quadrant. FINDINGS: Right Kidney: Length: 8.8 cm. Echogenicity within normal limits. No mass or hydronephrosis visualized. Left Kidney: Length: 8.3 cm. Echogenicity within normal limits. No mass or hydronephrosis visualized. Bladder: Decompressed by Foley catheter. IMPRESSION: Negative exam.  No hydronephrosis or abnormal echogenicity. Electronically Signed   By: Roderic Ovens.D.  On: 06/25/2017 11:42        Scheduled Meds: . amiodarone  200 mg Oral BID  . chlorhexidine  15 mL Mouth Rinse BID  . docusate sodium  100 mg Oral BID  . levothyroxine  75 mcg Oral QAC breakfast  . lidocaine  1 patch Transdermal Q24H  . mouth rinse  15 mL Mouth Rinse q12n4p  . metoprolol succinate  25 mg Oral BID  . pantoprazole  40 mg Oral Daily  . senna  1 tablet Oral BID   Continuous Infusions: . sodium chloride 75 mL/hr at 06/26/17 0509     LOS: 3 days    Time spent: 35 minutes    Hosie Poisson, MD [facility] Pager [phone]  If 7PM-7AM, please contact night-coverage [email or web address] Password [facility] 06/26/2017, 3:41 PM

## 2017-06-26 NOTE — Evaluation (Signed)
SLP Cancellation Note  Patient Details Name: Christine Perez MRN: [id number] DOB: 20-Sep-1925   Cancelled treatment:       Reason Eval/Treat Not Completed: Medical issues which prohibited therapy;Patient at procedure or test/unavailable(Reason Eval/Treat Not Completed: Medical issues which prohibited therapy, note multiple issues, currently in NM, will continue effort)   Macario Golds 06/26/2017, 11:58 AM  Luanna Salk, [facility] SLP [phone]

## 2017-06-26 NOTE — Care Management Important Message (Signed)
Important Message  Patient Details  Name: ZAMARIYA NEAL MRN: [id number] Date of Birth: Dec 23, 1925   Medicare Important Message Given:  Yes    Kerin Salen 06/26/2017, 10:57 AMImportant Message  Patient Details  Name: KAY SHIPPY MRN: [id number] Date of Birth: 11-25-1925   Medicare Important Message Given:  Yes    Kerin Salen 06/26/2017, 10:57 AM

## 2017-06-26 NOTE — Progress Notes (Signed)
PT Cancellation Note  Patient Details Name: GRIFFIN DEWILDE MRN: [id number] DOB: Jun 19, 1925   Cancelled Treatment:    Reason Eval/Treat Not Completed: Medical issues which prohibited therapy, note multiple issues, currently in NM. Will check back when all issues sorted  Out, possible kyphoplasty. Claretha Cooper 06/26/2017, 11:54 AM Tresa Endo PT [phone]

## 2017-06-26 NOTE — Progress Notes (Addendum)
Patient ID: Christine Perez, female   DOB: October 30, 1925, 84 y.o.   MRN: [id number]     Progress Note   Subjective   No abdominal pain -seems confused about why she is here  etc Stool this am - normal appearing per nurse Pt on o2- seem to have some increased WOB this am    Objective   Vital signs in last 24 hours: Temp:  [97.8 F (36.6 C)] 97.8 F (36.6 C) (04/12 0509) Pulse Rate:  [65-70] 65 (04/12 0509) Resp:  [18-20] 18 (04/12 0509) BP: (132-152)/(63-78) 151/63 (04/12 0509) SpO2:  [93 %-97 %] 97 % (04/12 0509) Last BM Date: 06/25/17 General:  elderly  white female in NAD, frail  Heart:  Regular rate and rhythm; no murmurs Lungs: Respirations even and labored, decreased BS bilat Abdomen:  Soft, nontender and nondistended. Normal bowel sounds. Extremities:  Without edema. Neurologic:  Alert and oriented,  grossly normal neurologically. Psych:  Cooperative. Normal mood and affect.  Intake/Output from previous day: 04/11 0701 - 04/12 0700 In: 2066.3 [P.O.:180; I.V.:1886.3] Out: 600 [Urine:600] Intake/Output this shift: No intake/output data recorded.  Lab Results: Recent Labs    06/24/17 0557  06/24/17 1224 06/25/17 0459 06/26/17 0540  WBC 8.9  --   --  7.2 9.4  HGB 9.4*   < > 10.0* 8.9* 10.0*  HCT 29.5*  --   --  27.7* 31.5*  PLT 83*  --   --  85* 108*   < > = values in this interval not displayed.   BMET Recent Labs    06/24/17 1224 06/25/17 0459 06/26/17 0540  NA 142 142 142  K 3.5 3.4* 4.4  CL 109 111 113*  CO2 23 21* 19*  GLUCOSE 93 74 103*  BUN 34* 32* 30*  CREATININE 1.98* 1.60* 1.37*  CALCIUM 8.8* 8.7* 9.4   LFT Recent Labs    06/25/17 0459 06/26/17 0540  PROT 4.9* 5.7*  ALBUMIN 2.1* 2.5*  AST 263* 267*  ALT 253* 282*  ALKPHOS 139* 170*  BILITOT 1.4* 1.4*  BILIDIR 0.8*  --   IBILI 0.6  --    PT/INR Recent Labs    06/15/2017 1735  LABPROT 13.5  INR 1.04    Studies/Results: US Renal  Result Date: 06/25/2017 CLINICAL DATA:  Acute  kidney injury. EXAM: RENAL / URINARY TRACT ULTRASOUND COMPLETE COMPARISON:  Ultrasound of the abdomen 05/29/2017 only examined the RIGHT upper quadrant. FINDINGS: Right Kidney: Length: 8.8 cm. Echogenicity within normal limits. No mass or hydronephrosis visualized. Left Kidney: Length: 8.3 cm. Echogenicity within normal limits. No mass or hydronephrosis visualized. Bladder: Decompressed by Foley catheter. IMPRESSION: Negative exam.  No hydronephrosis or abnormal echogenicity. Electronically Signed   By: Staci Righter M.D.   On: 06/25/2017 11:42       Assessment / Plan:    #46  84 year old female admitted with probable lower GI bleed in setting of chronic anticoagulation with Xarelto Patient has not had any active bleeding over the past 2 days.  Hgb has stabilized. This may have been a diverticular bleed.  Patient did not want to undergo colonoscopy very She has had prior colonoscopy in 2002 with left-sided diverticulosis documented at that time. CT plan for today  #2 anemia secondary to above #3 elevated LFTs-etiology unclear-will await CT findings #4 severe back pain secondary to compression fractures-which has severely limited her over the past couple of months.  Kyphoplasty is being considered #5 history of atrial fibrillation #6 history of breast cancer #  7 status post cholecystectomy #8 COPD-patient has been on oxygen, O2 sats previously have been normal.  She seems to be having some increased work of breathing this morning.  Will defer to medicine service      Contact  Amy Blanford, P.A.-C               [phone]     Attending physician's note   I have taken an interval history, reviewed the chart and examined the patient. I agree with the Advanced Practitioner's note, impression and recommendations.   No further GI bleeding.  Does not want colonoscopy.  CT scan of the abdomen and pelvis negative for any GI pathology.  She did have compressive vertebral fractures, being  evaluated for kyphoplasty.  No problems swallowing now.  Will sign off for now.  Trend CBC. Start Xarelto when okay with medicine. Pl call with any questions.  Carmell Austria, MD

## 2017-06-27 ENCOUNTER — Inpatient Hospital Stay (HOSPITAL_COMMUNITY): Payer: Medicare Other

## 2017-06-27 DIAGNOSIS — R195 Other fecal abnormalities: Secondary | ICD-10-CM

## 2017-06-27 DIAGNOSIS — R55 Syncope and collapse: Secondary | ICD-10-CM

## 2017-06-27 DIAGNOSIS — I509 Heart failure, unspecified: Secondary | ICD-10-CM

## 2017-06-27 DIAGNOSIS — E039 Hypothyroidism, unspecified: Secondary | ICD-10-CM

## 2017-06-27 DIAGNOSIS — I495 Sick sinus syndrome: Secondary | ICD-10-CM

## 2017-06-27 DIAGNOSIS — R945 Abnormal results of liver function studies: Secondary | ICD-10-CM

## 2017-06-27 DIAGNOSIS — R0602 Shortness of breath: Secondary | ICD-10-CM

## 2017-06-27 DIAGNOSIS — S22080A Wedge compression fracture of T11-T12 vertebra, initial encounter for closed fracture: Secondary | ICD-10-CM

## 2017-06-27 LAB — BPAM RBC
BLOOD PRODUCT EXPIRATION DATE: 201905092359
Blood Product Expiration Date: 201905162359
UNIT TYPE AND RH: 9500
UNIT TYPE AND RH: 9500

## 2017-06-27 LAB — TYPE AND SCREEN
ABO/RH(D): O NEG
Antibody Screen: NEGATIVE
UNIT DIVISION: 0
Unit division: 0

## 2017-06-27 MED ORDER — HYDROCODONE-ACETAMINOPHEN 5-325 MG PO TABS: 1.0000 | ORAL_TABLET | Freq: Four times a day (QID) | ORAL | Status: DC | PRN

## 2017-06-27 MED ORDER — FUROSEMIDE 10 MG/ML IJ SOLN
40.0000 mg | Freq: Once | INTRAMUSCULAR | Status: AC
  Administered 2017-06-27: 40 mg via INTRAVENOUS
  Filled 2017-06-27: qty 4

## 2017-06-27 MED ORDER — RESOURCE THICKENUP CLEAR PO POWD
ORAL | Status: DC | PRN
  Filled 2017-06-27: qty 125

## 2017-06-27 MED ORDER — MORPHINE SULFATE (PF) 4 MG/ML IV SOLN
1.0000 mg | INTRAVENOUS | Status: DC | PRN
  Administered 2017-06-27 (×2): 1 mg via INTRAVENOUS
  Administered 2017-06-27 – 2017-06-28 (×4): 2 mg via INTRAVENOUS
  Filled 2017-06-27 (×6): qty 1

## 2017-06-27 MED ORDER — HYDROCODONE-ACETAMINOPHEN 5-325 MG PO TABS
1.0000 | ORAL_TABLET | Freq: Four times a day (QID) | ORAL | Status: DC | PRN
  Administered 2017-06-27: 1 via ORAL
  Filled 2017-06-27: qty 1

## 2017-06-27 MED ORDER — HALOPERIDOL LACTATE 5 MG/ML IJ SOLN: 2.0000 mg | Freq: Four times a day (QID) | INTRAMUSCULAR | Status: DC | PRN

## 2017-06-27 NOTE — Evaluation (Signed)
Clinical/Bedside Swallow Evaluation Patient Details  Name: AIREONA TORELLI MRN: [id number] Date of Birth: March 08, 1926  Today's Date: 06/27/2017 Time: SLP Start Time (ACUTE ONLY): 1420 SLP Stop Time (ACUTE ONLY): 1440 SLP Time Calculation (min) (ACUTE ONLY): 20 min  Past Medical History:  Past Medical History:  Diagnosis Date  . Atrial fibrillation ([place])   . CARDIAC ARRHYTHMIA 01/08/2010  . COLONIC POLYPS, HX OF 10/09/2006  . COPD (chronic obstructive pulmonary disease) ([place])   . HYPERLIPIDEMIA 10/09/2006  . HYPERTENSION 10/09/2006  . HYPOTHYROIDISM 10/09/2006  . LASSITUDE 08/15/2009  . OSTEOPOROSIS 10/09/2006  . VERTIGO 08/07/2009  . Wears glasses   . Wears hearing aid    left   Past Surgical History:  Past Surgical History:  Procedure Laterality Date  . ABDOMINAL HYSTERECTOMY    . APPENDECTOMY    . BREAST SURGERY     left - lumpectomy  . CARDIOVERSION N/A 08/09/2013   Procedure: CARDIOVERSION;  Surgeon: Candee Furbish, MD;  Location[facility]COPY;  Service: Cardiovascular;  Laterality: N/A;  . CATARACT EXTRACTION    . CHOLECYSTECTOMY    . PACEMAKER PLACEMENT N/A 12/05/2013  . PARTIAL MASTECTOMY WITH NEEDLE LOCALIZATION Left 06/29/2012   Procedure: PARTIAL MASTECTOMY WITH NEEDLE LOCALIZATION;  Surgeon: Adin Hector, MD;  Location: [facility];  Service: General;  Laterality: Left;  . TONSILLECTOMY    . YAG LASER APPLICATION     HPI:  Jamisha Hoeschen is a 84 y.o. female with past medical history significant for a fib on amiodarone, Xarelto, and a pacemaker, history of hypertension and history of fall in February with vertebral fracture on nonsteroidals and hydrocodone for pain control, comes in for rectal bleeding and syncopal episode   Assessment / Plan / Recommendation Clinical Impression  Pt demonstrates acutely impaired respiratory function with heavy work of breath at baseline. Pt was given morphine over an hour ago due to increasing restlessness and pain. Daughter  and RN at bedside report Pt has had little desire for PO and when solids were attempted pt gagged and expectorated the bolus. RN has been giving meds crushed with success and pt has tolerated thin liquids. Despite acute respiratory impairment today, pt still responsive to small sips of water and bites of puree without signs of aspiration. Did not trial solids given pt status. Pt appears to be declining, objective test not feasible in pts condition. Plan is for potential surgery Monday. Recommend pt downgrade to puree and and sips of water if desired, with SLP f/u as plan of care progresses. May need objective testing in the future. SLP Visit Diagnosis: Dysphagia, oropharyngeal phase (R13.12)    Aspiration Risk  Moderate aspiration risk    Diet Recommendation Dysphagia 1 (Puree);Thin liquid   Liquid Administration via: Straw Medication Administration: Crushed with puree Supervision: Full supervision/cueing for compensatory strategies Postural Changes: Other (Comment)(head supported)    Other  Recommendations     Follow up Recommendations Skilled Nursing facility      Frequency and Duration min 2x/week  2 weeks       Prognosis Prognosis for Safe Diet Advancement: Fair      Swallow Study   General HPI: Kidada Ging is a 84 y.o. female with past medical history significant for a fib on amiodarone, Xarelto, and a pacemaker, history of hypertension and history of fall in February with vertebral fracture on nonsteroidals and hydrocodone for pain control, comes in for rectal bleeding and syncopal episode Type of Study: Bedside Swallow Evaluation Previous Swallow Assessment: none Diet  Prior to this Study: Dysphagia 3 (soft);Thin liquids Temperature Spikes Noted: No Respiratory Status: Nasal cannula History of Recent Intubation: No Behavior/Cognition: Lethargic/Drowsy Oral Care Completed by SLP: No Oral Cavity - Dentition: Poor condition;Missing dentition Vision: Impaired for  self-feeding Self-Feeding Abilities: Total assist Patient Positioning: Postural control interferes with function;Partially reclined Baseline Vocal Quality: Low vocal intensity Volitional Cough: Cognitively unable to elicit Volitional Swallow: Unable to elicit    Oral/Motor/Sensory Function Overall Oral Motor/Sensory Function: Generalized oral weakness   Ice Chips     Thin Liquid Thin Liquid: Impaired Presentation: Cup;Straw Oral Phase Impairments: Reduced labial seal Oral Phase Functional Implications: Right anterior spillage    Nectar Thick Nectar Thick Liquid: Not tested   Honey Thick Honey Thick Liquid: Not tested   Puree Puree: Impaired Presentation: Spoon Oral Phase Impairments: Poor awareness of bolus   Solid   GO   Solid: Not tested       Herbie Baltimore, MA CCC-SLP [phone]  Lynann Beaver 06/27/2017,2:49 PM

## 2017-06-27 NOTE — Progress Notes (Signed)
PROGRESS NOTE    ETHERINE MACKOWIAK  MBT:[id number] DOB: November 01, 1925 DOA: 07/01/2017 PCP: Marletta Lor, MD    Brief Narrative:PollyLawrenceis a84 y.o.femalewith past medical history significant for a fib on amiodarone, Xarelto, and a pacemaker, history of hypertension and history of fall in February with vertebral fracture on nonsteroidals and hydrocodone for pain control, comes in for rectal bleeding and syncopal episode. She was found to have rib fractures, acute to chronic compression fractures of the thoracic and lumbar vertebra. IR consulted and plan for kyphoplasty after insurance authorization. On 4./13, pt was found to be tachpnea, requiring 2l it of [place] oxygen and CXR shows pulmonary edema.     Assessment & Plan:   Active Problems:   Hypothyroidism   Dyslipidemia   Essential hypertension   Atrial fibrillation (HCC)   Chronic anticoagulation   SSS (sick sinus syndrome) (HCC)   CHF (congestive heart failure) (HCC)   Syncope   Rib fracture   Syncope following a BM: Possibly vaso vagal syncope.  Pt has pacemaker  Sec to SSS.  PT evaluation recommending SNF.    Rectal bleed:  Unclear etiology. Hemoccult positive. Drop in hemoglobin to 9 from baseline of 12.  Transfuse to keep hemoglobin greater than 7.  Holding anti coagulation. Hemoglobin stabilized around 10.  Pt does not want any endoscopic procedures at this time  GI on board and recommended getting CT abd and pelvis, which showed mild colitis.  Resume PPI. No further episodes of rectal bleeding.    Right flank pain:  Unclear etiology.  US renal does not show any obstruction.    Right sided lower chest wall pain : Secondary to 7th and 8 th rib fractures.  Pain control.  Discussed with the patient and her daughter at bedside.    Elevated liver function panel: Unclear etiology. ? Sec to amiodarone vs liver congestion from CHF. CT abd and pelvis does not show any liver abnormalities.  WILL hold the  amiodarone and start her on IV lasix for diuresis.     PAF: Rate controlled and amiodarone held for elevated liver function panel.  Echo ordered.  Not on anti coagulation due to rectal bleed.    Elevated troponin: Flat trend. No ischemic changes.    Hypothyroidism: Resume synthroid.   Upper back pain : Ordered bone scan to evaluate for acuity of the compression fractures.  Requested IR for evaluation of kyphoplasty.  Bone scan shows chronic T12 fracture with acute component. Plan for kyphoplasty after insurance authorization.    Acute respiratory failure with hypoxia and tachypnea: Sec to pulm edema. Lasix started. Echocardiogram ordered.  Stop the fluids and start her on strict intake and output.     DVT prophylaxis: scd's Code Status:DNR.  Family Communication: discussed with daughter at bedside Disposition Plan: pending resolution of back pain and rectal bleeding. SNF once IR is done with kyphoplasty.    Consultants:   GI.   IR.    Procedures: CT abd and pelvis.   bONE SCAN.    Antimicrobials: none.    Subjective: WORSENING shortness of breath, confused. Back pain.   Objective: Vitals:   06/26/17 2220 06/27/17 0439 06/27/17 0912 06/27/17 1318  BP: 130/67 (!) 146/63  (!) 128/59  Pulse: (!) 58 (!) 59 75 70  Resp: 20 20  (!) 22  Temp: 98 F (36.7 C) 98 F (36.7 C)  98.3 F (36.8 C)  TempSrc: Oral Oral  Oral  SpO2: 99% 98%  94%  Weight:  Height:        Intake/Output Summary (Last 24 hours) at 06/27/2017 1704 Last data filed at 06/27/2017 1400 Gross per 24 hour  Intake 1800 ml  Output 575 ml  Net 1225 ml   Filed Weights   06/22/2017 1700 07/12/2017 2359  Weight: 59 kg (130 lb) 55.4 kg (122 lb 2.2 oz)    Examination:  General exam: in mild distress requiring 2l it [place] oxygen.  Respiratory system: crackles at bases, no wheezing heard. Air entry fair.  Cardiovascular system: S1 & S2 heard, RRR. No JVD,  No pedal edema. Gastrointestinal  system: right flank pain, right chest wall tenderness. Bowel sounds heard. abd is soft non tender non distended.  Central nervous system: alert but confused.  Extremities: no cyanosis or clubbing.  Skin: No rashes, lesions or ulcers Psychiatry:  Restless and slightly agitated.     Data Reviewed: I have personally reviewed following labs and imaging studies  CBC: Recent Labs  Lab 07/09/2017 1735 06/24/17 0018 06/24/17 0557 06/24/17 0816 06/24/17 1224 06/25/17 0459 06/26/17 0540  WBC 9.3  --  8.9  --   --  7.2 9.4  NEUTROABS 8.3*  --   --   --   --   --   --   HGB 12.2 9.9* 9.4* 10.6* 10.0* 8.9* 10.0*  HCT 37.9 31.3* 29.5*  --   --  27.7* 31.5*  MCV 102.2*  --  103.1*  --   --  103.0* 104.0*  PLT 121*  --  83*  --   --  85* 809*   Basic Metabolic Panel: Recent Labs  Lab 06/22/2017 1735 06/24/17 1224 06/25/17 0459 06/26/17 0540  NA 140 142 142 142  K 3.9 3.5 3.4* 4.4  CL 102 109 111 113*  CO2 26 23 21* 19*  GLUCOSE 145* 93 74 103*  BUN 33* 34* 32* 30*  CREATININE 1.83* 1.98* 1.60* 1.37*  CALCIUM 10.2 8.8* 8.7* 9.4   GFR: Estimated Creatinine Clearance: 20.5 mL/min (A) (by C-G formula based on SCr of 1.37 mg/dL (H)). Liver Function Tests: Recent Labs  Lab 06/25/2017 1735 06/25/17 0459 06/26/17 0540  AST 360* 263* 267*  ALT 326* 253* 282*  ALKPHOS 185* 139* 170*  BILITOT 1.6* 1.4* 1.4*  PROT 6.8 4.9* 5.7*  ALBUMIN 2.9* 2.1* 2.5*   No results for input(s): LIPASE, AMYLASE in the last 168 hours. No results for input(s): AMMONIA in the last 168 hours. Coagulation Profile: Recent Labs  Lab 07/05/2017 1735  INR 1.04   Cardiac Enzymes: Recent Labs  Lab 06/29/2017 1735 06/24/17 0018 06/24/17 0557 06/24/17 1224  TROPONINI 0.05* 0.04* 0.05* 0.04*   BNP (last 3 results) No results for input(s): PROBNP in the last 8760 hours. HbA1C: No results for input(s): HGBA1C in the last 72 hours. CBG: Recent Labs  Lab 07/04/2017 1754  GLUCAP 135*   Lipid Profile: No  results for input(s): CHOL, HDL, LDLCALC, TRIG, CHOLHDL, LDLDIRECT in the last 72 hours. Thyroid Function Tests: No results for input(s): TSH, T4TOTAL, FREET4, T3FREE, THYROIDAB in the last 72 hours. Anemia Panel: No results for input(s): VITAMINB12, FOLATE, FERRITIN, TIBC, IRON, RETICCTPCT in the last 72 hours. Sepsis Labs: No results for input(s): PROCALCITON, LATICACIDVEN in the last 168 hours.  No results found for this or any previous visit (from the past 240 hour(s)).       Radiology Studies: Dg Chest 2 View  Result Date: 06/27/2017 CLINICAL DATA:  Shortness of Breath EXAM: CHEST - 2 VIEW COMPARISON:  June 23, 2017 FINDINGS: There are pleural effusions bilaterally with interstitial edema. There is cardiomegaly with pulmonary venous hypertension. There is patchy airspace consolidation in the bases. Pacemaker leads are attached to the right atrium and right ventricle. No adenopathy. Bones appear osteoporotic. IMPRESSION: Findings felt to be indicative of congestive heart failure. Superimposed pneumonia in the bases cannot be excluded, although opacity in these areas may represent alveolar edema. Both alveolar edema and pneumonia may be present concurrently. No adenopathy evident. Pacemaker leads attached to right atrium and right ventricle. Electronically Signed   By: Lowella Grip III M.D.   On: 06/27/2017 16:34   Nm Bone Scan Whole Body  Result Date: 06/26/2017 CLINICAL DATA:  84 year old who fell in February, 2019, with impact to the low back and ribs. Patient states that she has had prior compression fractures and rib fractures. EXAM: NUCLEAR MEDICINE WHOLE BODY BONE SCAN TECHNIQUE: Whole body anterior and posterior images were obtained approximately 3 hours after intravenous injection of radiopharmaceutical. RADIOPHARMACEUTICALS:  20.2 mCi Technetium-29m MDP IV COMPARISON:  No prior nuclear bone scan. Bone window images from CT abdomen and pelvis performed earlier same date. Lumbar  spine x-rays 06/27/2017, 05/14/2017. FINDINGS: Symmetric marked focal abnormal activity involving the POSTERIOR elements of T12 bilaterally. Symmetric mild focal abnormal activity involving the POSTERIOR elements of L3 bilaterally. Prior imaging has demonstrated chronic compression fractures at both levels. Degenerative activity involving the sternoclavicular joints, the acromioclavicular joint, the junction of the sternal body and manubrium, the bilateral first MTP joints. IMPRESSION: 1. Marked abnormal activity involving T12. The patient has a chronic T12 compression fracture, though the abnormal activity on this examination suggests that there is an acute or subacute component. 2. Mild abnormal activity involving L3, likely related to healing of the remote compression fracture at this level. 3. No abnormal activity elsewhere to suggest acute or subacute fractures. Electronically Signed   By: Evangeline Dakin M.D.   On: 06/26/2017 14:22   Ct Abdomen Pelvis W Contrast  Result Date: 06/26/2017 CLINICAL DATA:  Inpatient. Right abdominal and back pain. Nausea. Weight loss. Anorexia. Constipation. EXAM: CT ABDOMEN AND PELVIS WITH CONTRAST TECHNIQUE: Multidetector CT imaging of the abdomen and pelvis was performed using the standard protocol following bolus administration of intravenous contrast. CONTRAST:  79mL OMNIPAQUE IOHEXOL 300 MG/ML  SOLN COMPARISON:  06/25/2017 renal sonogram. 05/29/2017 abdominal sonogram. FINDINGS: Lower chest: Small dependent bilateral pleural effusions, right greater than left. Moderate dependent atelectasis at both lung bases. Cardiomegaly. Pacer leads are seen in the right atrium and right ventricular apex. Coronary atherosclerosis. Hepatobiliary: Normal liver size. No liver mass. Cholecystectomy. Bile ducts are within normal post cholecystectomy limits with CBD diameter 9 mm. No radiopaque choledocholithiasis. Pancreas: Normal, with no mass or duct dilation. Spleen: Normal size. No  mass. Adrenals/Urinary Tract: Normal adrenals. No hydronephrosis. Subcentimeter hypodense renal cortical lesions in the upper left kidney are too small to characterize and require no follow-up. Bladder collapsed by indwelling Foley catheter and appears normal. Stomach/Bowel: Normal non-distended stomach. Normal caliber small bowel with no small bowel wall thickening. Appendectomy. Collapsed large bowel. Moderate sigmoid diverticulosis. Questionable mild wall thickening in the right colon without significant pericolonic fat stranding. No additional sites of large bowel wall thickening or significant pericolonic fat stranding. Vascular/Lymphatic: Atherosclerotic nonaneurysmal abdominal aorta. Patent portal, splenic, hepatic and renal veins. No pathologically enlarged lymph nodes in the abdomen or pelvis. Reproductive: Status post hysterectomy, with no abnormal findings at the vaginal cuff. No adnexal mass. Other: No pneumoperitoneum, ascites or focal fluid  collection. Nonspecific ill-defined mild presacral free fluid. Mild anasarca. Musculoskeletal: No aggressive appearing focal osseous lesions. Subacute healing anterior right seventh and eighth rib fractures. Moderate to severe T12 vertebral compression fracture demonstrates mild worsening of vertebral body height loss since 05/14/2017 radiographs. Severe L3 vertebral compression fracture is stable since 05/14/2017 radiographs. Mild thoracolumbar spondylosis. IMPRESSION: 1. Healing subacute anterior right seventh and eighth rib fractures. 2. Moderate to severe T12 vertebral compression fracture with mild worsening of vertebral body height loss since 05/14/2017 radiographs. Chronic severe L3 vertebral compression fracture is stable. 3. Third-spacing of fluid with small bilateral pleural effusions, mild anasarca and mild presacral fluid. Cardiomegaly. 4. Mild right colonic wall thickening may be due to hypoalbuminemia. Mild right colitis cannot be strictly excluded. No  bowel obstruction. 5. Chronic findings include: Aortic Atherosclerosis (ICD10-I70.0). Coronary atherosclerosis. Moderate sigmoid diverticulosis. Electronically Signed   By: Ilona Sorrel M.D.   On: 06/26/2017 12:51        Scheduled Meds: . chlorhexidine  15 mL Mouth Rinse BID  . docusate sodium  100 mg Oral BID  . furosemide  40 mg Intravenous Once  . levothyroxine  75 mcg Oral QAC breakfast  . lidocaine  1 patch Transdermal Q24H  . mouth rinse  15 mL Mouth Rinse q12n4p  . metoprolol succinate  25 mg Oral BID  . pantoprazole  40 mg Oral Daily  . senna  1 tablet Oral BID   Continuous Infusions:    LOS: 4 days    Time spent: 35 minutes    Hosie Poisson, MD [facility] Pager [phone]  If 7PM-7AM, please contact night-coverage [email or web address] Password [facility] 06/27/2017, 5:04 PM

## 2017-06-28 ENCOUNTER — Inpatient Hospital Stay (HOSPITAL_COMMUNITY): Payer: Medicare Other

## 2017-06-28 DIAGNOSIS — M549 Dorsalgia, unspecified: Secondary | ICD-10-CM | POA: Diagnosis present

## 2017-06-28 DIAGNOSIS — K922 Gastrointestinal hemorrhage, unspecified: Secondary | ICD-10-CM

## 2017-06-28 DIAGNOSIS — S32030S Wedge compression fracture of third lumbar vertebra, sequela: Secondary | ICD-10-CM

## 2017-06-28 DIAGNOSIS — Z515 Encounter for palliative care: Secondary | ICD-10-CM

## 2017-06-28 DIAGNOSIS — Y92009 Unspecified place in unspecified non-institutional (private) residence as the place of occurrence of the external cause: Secondary | ICD-10-CM

## 2017-06-28 DIAGNOSIS — S22020A Wedge compression fracture of second thoracic vertebra, initial encounter for closed fracture: Secondary | ICD-10-CM

## 2017-06-28 DIAGNOSIS — S22080A Wedge compression fracture of T11-T12 vertebra, initial encounter for closed fracture: Secondary | ICD-10-CM | POA: Diagnosis present

## 2017-06-28 DIAGNOSIS — I361 Nonrheumatic tricuspid (valve) insufficiency: Secondary | ICD-10-CM

## 2017-06-28 DIAGNOSIS — W19XXXS Unspecified fall, sequela: Secondary | ICD-10-CM

## 2017-06-28 LAB — BASIC METABOLIC PANEL
ANION GAP: 10 (ref 5–15)
BUN: 34 mg/dL — ABNORMAL HIGH (ref 6–20)
CHLORIDE: 115 mmol/L — AB (ref 101–111)
CO2: 22 mmol/L (ref 22–32)
CREATININE: 1.36 mg/dL — AB (ref 0.44–1.00)
Calcium: 9.9 mg/dL (ref 8.9–10.3)
GFR calc non Af Amer: 33 mL/min — ABNORMAL LOW (ref >60)
GFR, EST AFRICAN AMERICAN: 39 mL/min — AB (ref >60)
Glucose, Bld: 87 mg/dL (ref 65–99)
Potassium: 3.9 mmol/L (ref 3.5–5.1)
SODIUM: 147 mmol/L — AB (ref 135–145)

## 2017-06-28 LAB — HEPATIC FUNCTION PANEL
ALT: 228 U/L — ABNORMAL HIGH (ref 14–54)
AST: 198 U/L — AB (ref 15–41)
Albumin: 2.3 g/dL — ABNORMAL LOW (ref 3.5–5.0)
Alkaline Phosphatase: 151 U/L — ABNORMAL HIGH (ref 38–126)
BILIRUBIN TOTAL: 1.5 mg/dL — AB (ref 0.3–1.2)
Bilirubin, Direct: 0.8 mg/dL — ABNORMAL HIGH (ref 0.1–0.5)
Indirect Bilirubin: 0.7 mg/dL (ref 0.3–0.9)
Total Protein: 5.3 g/dL — ABNORMAL LOW (ref 6.5–8.1)

## 2017-06-28 LAB — CBC
HCT: 27.4 % — ABNORMAL LOW (ref 36.0–46.0)
HEMOGLOBIN: 8.5 g/dL — AB (ref 12.0–15.0)
MCH: 31.8 pg (ref 26.0–34.0)
MCHC: 31 g/dL (ref 30.0–36.0)
MCV: 102.6 fL — ABNORMAL HIGH (ref 78.0–100.0)
Platelets: 80 10*3/uL — ABNORMAL LOW (ref 150–400)
RBC: 2.67 MIL/uL — AB (ref 3.87–5.11)
RDW: 18 % — ABNORMAL HIGH (ref 11.5–15.5)
WBC: 10.3 10*3/uL (ref 4.0–10.5)

## 2017-06-28 LAB — ECHOCARDIOGRAM COMPLETE
HEIGHTINCHES: 58 in
WEIGHTICAEL: 1954.16 [oz_av]

## 2017-06-28 MED ORDER — FENTANYL CITRATE (PF) 100 MCG/2ML IJ SOLN
12.5000 ug | INTRAMUSCULAR | Status: DC | PRN
  Administered 2017-06-28 (×2): 12.5 ug via INTRAVENOUS
  Filled 2017-06-28 (×3): qty 2

## 2017-06-28 MED ORDER — MORPHINE SULFATE (PF) 4 MG/ML IV SOLN
2.0000 mg | INTRAVENOUS | Status: DC | PRN
  Administered 2017-06-28 – 2017-06-29 (×3): 2 mg via INTRAVENOUS
  Filled 2017-06-28 (×4): qty 1

## 2017-06-28 MED ORDER — SODIUM CHLORIDE 0.9 % IV SOLN
1.0000 g | INTRAVENOUS | Status: DC
  Administered 2017-06-28 – 2017-06-30 (×3): 1 g via INTRAVENOUS
  Filled 2017-06-28 (×4): qty 1

## 2017-06-28 MED ORDER — MORPHINE SULFATE (PF) 4 MG/ML IV SOLN
2.0000 mg | INTRAVENOUS | Status: DC
  Administered 2017-06-28 – 2017-06-29 (×4): 2 mg via INTRAVENOUS
  Filled 2017-06-28 (×3): qty 1

## 2017-06-28 MED ORDER — VANCOMYCIN HCL 500 MG IV SOLR
500.0000 mg | INTRAVENOUS | Status: DC
  Administered 2017-06-30: 500 mg via INTRAVENOUS
  Filled 2017-06-28: qty 500

## 2017-06-28 MED ORDER — ACETAMINOPHEN 10 MG/ML IV SOLN
1000.0000 mg | Freq: Four times a day (QID) | INTRAVENOUS | Status: AC
  Administered 2017-06-29 (×4): 1000 mg via INTRAVENOUS
  Filled 2017-06-28 (×4): qty 100

## 2017-06-28 MED ORDER — VANCOMYCIN HCL IN DEXTROSE 1-5 GM/200ML-% IV SOLN
1000.0000 mg | Freq: Once | INTRAVENOUS | Status: AC
  Administered 2017-06-28: 1000 mg via INTRAVENOUS
  Filled 2017-06-28: qty 200

## 2017-06-28 MED ORDER — BISACODYL 10 MG RE SUPP: 10.0000 mg | Freq: Every day | RECTAL | Status: DC | PRN

## 2017-06-28 MED ORDER — BISACODYL 5 MG PO TBEC: 5.0000 mg | DELAYED_RELEASE_TABLET | Freq: Every day | ORAL | Status: DC

## 2017-06-28 MED ORDER — CALCITONIN (SALMON) 200 UNIT/ACT NA SOLN
1.0000 | Freq: Every day | NASAL | Status: DC
  Administered 2017-06-28 – 2017-06-30 (×3): 1 via NASAL
  Filled 2017-06-28: qty 3.7

## 2017-06-28 MED ORDER — LIDOCAINE 5 % EX PTCH
2.0000 | MEDICATED_PATCH | CUTANEOUS | Status: DC
  Administered 2017-06-29 – 2017-06-30 (×2): 2 via TRANSDERMAL
  Filled 2017-06-28 (×2): qty 2

## 2017-06-28 MED ORDER — KETOROLAC TROMETHAMINE 15 MG/ML IJ SOLN
15.0000 mg | Freq: Four times a day (QID) | INTRAMUSCULAR | Status: DC | PRN
  Administered 2017-06-28: 15 mg via INTRAVENOUS
  Filled 2017-06-28: qty 1

## 2017-06-28 NOTE — Progress Notes (Signed)
PROGRESS NOTE    Christine Perez  XBJ:[id number] DOB: [birth year] DOA: 06/28/2017 PCP: Marletta Lor, MD    Brief Narrative:PollyLawrenceis a89 y.o.femalewith past medical history significant for a fib on amiodarone, Xarelto, and a pacemaker, history of hypertension and history of fall in February with vertebral fracture on nonsteroidals and hydrocodone for pain control, comes in for rectal bleeding and syncopal episode. She was found to have rib fractures, acute to chronic compression fractures of the thoracic and lumbar vertebra. IR consulted and plan for kyphoplasty after insurance authorization. On 4./13, pt was found to be tachpnea, requiring 2l it of [place] oxygen and CXR shows pulmonary edema.     Assessment & Plan:   Active Problems:   Hypothyroidism   Dyslipidemia   Essential hypertension   Atrial fibrillation (HCC)   Chronic anticoagulation   SSS (sick sinus syndrome) (HCC)   CHF (congestive heart failure) (HCC)   Syncope   Rib fracture   Syncope following a BM: Possibly vaso vagal syncope.  Pt has pacemaker  Sec to SSS.  PT evaluation recommending SNF.  Echocardiogram is not significant for acute abnormalities.   Rectal bleed:  Unclear etiology. Hemoccult positive. Drop in hemoglobin to 9 from baseline of 12.  Transfuse to keep hemoglobin greater than 7.  Holding anti coagulation. Hemoglobin stabilized around 10.  Pt does not want any endoscopic procedures at this time  GI on board and recommended getting CT abd and pelvis, which showed mild colitis.  Resume PPI. No further episodes of rectal bleeding.    Right flank pain:  Unclear etiology.  US renal does not show any obstruction.    Right sided lower chest wall pain : Secondary to 7th and 8 th rib fractures.  Pain control.  Discussed with the patient and her daughter at bedside.    Elevated liver function panel: Unclear etiology. ? Sec to amiodarone vs liver congestion from CHF. CT abd and  pelvis does not show any liver abnormalities.  WILL hold the amiodarone. One dose of lasix given yesterday with good urine output.     PAF: Rate controlled and amiodarone held for elevated liver enzymes.  Not on anti coagulation due to rectal bleed.    Elevated troponin: Flat trend. No ischemic changes.    Hypothyroidism: Resume synthroid.   Upper back pain : Ordered bone scan to evaluate for acuity of the compression fractures.  Requested IR for evaluation of kyphoplasty.  Bone scan shows chronic T12 fracture with acute component. Plan for kyphoplasty after insurance authorization.    Acute respiratory failure with hypoxia and tachypnea: Sec to pulm edema. Lasix started. Echocardiogram ordered.  Stop the fluids and start her on strict intake and output.     DVT prophylaxis: scd's Code Status:DNR.  Family Communication: discussed with daughter at bedside Disposition Plan: pending resolution of back pain and rectal bleeding. SNF once IR is done with kyphoplasty.    Consultants:   GI.   IR.    Procedures: CT abd and pelvis.   bONE SCAN.    Antimicrobials: none.    Subjective: WORSENING shortness of breath, confused. Back pain.   Objective: Vitals:   06/27/17 1318 06/27/17 2131 06/28/17 0501 06/28/17 1511  BP: (!) 128/59 (!) 142/78 (!) 104/47 (!) 130/92  Pulse: 70 74 60 76  Resp: (!) 22 16 12 20   Temp: 98.3 F (36.8 C) 97.9 F (36.6 C) 97.7 F (36.5 C) 97.7 F (36.5 C)  TempSrc: Oral Oral Oral Oral  SpO2: 94%  94% 95% 94%  Weight:      Height:        Intake/Output Summary (Last 24 hours) at 06/28/2017 1558 Last data filed at 06/28/2017 1500 Gross per 24 hour  Intake 90 ml  Output 2000 ml  Net -1910 ml   Filed Weights   06/22/2017 1700 07/03/2017 2359  Weight: 59 kg (130 lb) 55.4 kg (122 lb 2.2 oz)    Examination:  General exam: in mild distress requiring 2l it [place] oxygen.  Respiratory system: crackles at bases, no wheezing heard. Air entry  fair.  Cardiovascular system: S1 & S2 heard, RRR. No JVD,  No pedal edema. Gastrointestinal system: right flank pain, right chest wall tenderness. Bowel sounds heard. abd is soft non tender non distended.  Central nervous system: alert but confused.  Extremities: no cyanosis or clubbing.  Skin: No rashes, lesions or ulcers Psychiatry:  Restless and slightly agitated.     Data Reviewed: I have personally reviewed following labs and imaging studies  CBC: Recent Labs  Lab 06/22/2017 1735 06/24/17 0018 06/24/17 0557 06/24/17 0816 06/24/17 1224 06/25/17 0459 06/26/17 0540 06/28/17 0448  WBC 9.3  --  8.9  --   --  7.2 9.4 10.3  NEUTROABS 8.3*  --   --   --   --   --   --   --   HGB 12.2 9.9* 9.4* 10.6* 10.0* 8.9* 10.0* 8.5*  HCT 37.9 31.3* 29.5*  --   --  27.7* 31.5* 27.4*  MCV 102.2*  --  103.1*  --   --  103.0* 104.0* 102.6*  PLT 121*  --  83*  --   --  85* 108* 80*   Basic Metabolic Panel: Recent Labs  Lab 07/10/2017 1735 06/24/17 1224 06/25/17 0459 06/26/17 0540 06/28/17 0448  NA 140 142 142 142 147*  K 3.9 3.5 3.4* 4.4 3.9  CL 102 109 111 113* 115*  CO2 26 23 21* 19* 22  GLUCOSE 145* 93 74 103* 87  BUN 33* 34* 32* 30* 34*  CREATININE 1.83* 1.98* 1.60* 1.37* 1.36*  CALCIUM 10.2 8.8* 8.7* 9.4 9.9   GFR: Estimated Creatinine Clearance: 20.7 mL/min (A) (by C-G formula based on SCr of 1.36 mg/dL (H)). Liver Function Tests: Recent Labs  Lab 06/20/2017 1735 06/25/17 0459 06/26/17 0540 06/28/17 0448  AST 360* 263* 267* 198*  ALT 326* 253* 282* 228*  ALKPHOS 185* 139* 170* 151*  BILITOT 1.6* 1.4* 1.4* 1.5*  PROT 6.8 4.9* 5.7* 5.3*  ALBUMIN 2.9* 2.1* 2.5* 2.3*   No results for input(s): LIPASE, AMYLASE in the last 168 hours. No results for input(s): AMMONIA in the last 168 hours. Coagulation Profile: Recent Labs  Lab 07/06/2017 1735  INR 1.04   Cardiac Enzymes: Recent Labs  Lab 07/13/2017 1735 06/24/17 0018 06/24/17 0557 06/24/17 1224  TROPONINI 0.05* 0.04*  0.05* 0.04*   BNP (last 3 results) No results for input(s): PROBNP in the last 8760 hours. HbA1C: No results for input(s): HGBA1C in the last 72 hours. CBG: Recent Labs  Lab 06/16/2017 1754  GLUCAP 135*   Lipid Profile: No results for input(s): CHOL, HDL, LDLCALC, TRIG, CHOLHDL, LDLDIRECT in the last 72 hours. Thyroid Function Tests: No results for input(s): TSH, T4TOTAL, FREET4, T3FREE, THYROIDAB in the last 72 hours. Anemia Panel: No results for input(s): VITAMINB12, FOLATE, FERRITIN, TIBC, IRON, RETICCTPCT in the last 72 hours. Sepsis Labs: No results for input(s): PROCALCITON, LATICACIDVEN in the last 168 hours.  No results found for  this or any previous visit (from the past 240 hour(s)).       Radiology Studies: Dg Chest 2 View  Result Date: 06/27/2017 CLINICAL DATA:  Shortness of Breath EXAM: CHEST - 2 VIEW COMPARISON:  June 23, 2017 FINDINGS: There are pleural effusions bilaterally with interstitial edema. There is cardiomegaly with pulmonary venous hypertension. There is patchy airspace consolidation in the bases. Pacemaker leads are attached to the right atrium and right ventricle. No adenopathy. Bones appear osteoporotic. IMPRESSION: Findings felt to be indicative of congestive heart failure. Superimposed pneumonia in the bases cannot be excluded, although opacity in these areas may represent alveolar edema. Both alveolar edema and pneumonia may be present concurrently. No adenopathy evident. Pacemaker leads attached to right atrium and right ventricle. Electronically Signed   By: Lowella Grip III M.D.   On: 06/27/2017 16:34        Scheduled Meds: . [START ON 06/29/2017] bisacodyl  5 mg Oral Daily  . calcitonin (salmon)  1 spray Alternating Nares Daily  . chlorhexidine  15 mL Mouth Rinse BID  . levothyroxine  75 mcg Oral QAC breakfast  . [START ON 06/29/2017] lidocaine  2 patch Transdermal Q24H  . mouth rinse  15 mL Mouth Rinse q12n4p  . metoprolol succinate  25  mg Oral BID  . pantoprazole  40 mg Oral Daily  . senna  1 tablet Oral BID   Continuous Infusions: . ceFEPime (MAXIPIME) IV Stopped (06/28/17 1258)  . [START ON 07-02-17] vancomycin       LOS: 5 days    Time spent: 35 minutes    Hosie Poisson, MD [facility] Pager [phone]  If 7PM-7AM, please contact night-coverage [email or web address] Password [facility] 06/28/2017, 3:58 PM

## 2017-06-28 NOTE — Progress Notes (Signed)
Pharmacy Antibiotic Note  Christine Perez is a 84 y.o. female admitted on 07/11/2017 with rectal bleeding, syncope, and evaluation for possible kyphoplasty.  Pharmacy has been consulted for vancomycin and Cefepime dosing for pneumonia.  Afebrile WBC 10.3 SCr 1.36 (baseline SCr ~1 07/2016), CrCl ~ 21 ml/min.  Plan: Cefepime 1g IV q24h  Vancomycin 1g IV once then 500mg  IV q48h. Measure Vanc peak, trough at steady state.  Goal AUC 400-500. Follow up renal fxn, culture results, and clinical course.    Height: 4\' 10"  (147.3 cm) Weight: 122 lb 2.2 oz (55.4 kg) IBW/kg (Calculated) : 40.9  Temp (24hrs), Avg:98 F (36.7 C), Min:97.7 F (36.5 C), Max:98.3 F (36.8 C)  Recent Labs  Lab 06/18/2017 1735 06/24/17 0557 06/24/17 1224 06/25/17 0459 06/26/17 0540 06/28/17 0448  WBC 9.3 8.9  --  7.2 9.4 10.3  CREATININE 1.83*  --  1.98* 1.60* 1.37* 1.36*    Estimated Creatinine Clearance: 20.7 mL/min (A) (by C-G formula based on SCr of 1.36 mg/dL (H)).    Allergies  Allergen Reactions  . Codeine Shortness Of Breath    Difficulty breathing  . Tramadol Other (See Comments)    Hallucinations and "my feet jerk uncontrollably"    Thank you for allowing pharmacy to be a part of this patient's care.  Gretta Arab PharmD, BCPS Pager [phone] 06/28/2017 11:45 AM

## 2017-06-28 NOTE — Consult Note (Signed)
Palliative Care Reason: Symptom Management Requested by: TRH  84 yo woman with afib on amiodarone, xaralto with pacemaker who had a fall in Feb 2019 resulting in vertebral compression fractures T12 and L3 and rib fractures. Pain control has been challenging at home but she was admitted to the hospital with a GI bleed and symptomatic anemia (syncope at home)-possibly constipation with home opioids led to her rectal bleed. While at home pain limited her mobility. Since being admitted on 4/10 she has had a significant decline including in her functional status, mental status and is now lying in the bed writing in pain yelling out on my visit. She has had a total of 4 mg of morphine in last 12 hours, one dose of toradol, hydrocodone and lidoderm patches. Her pain limits any kind of mobility.   I evaluated patient and spoke with her RN and family at bedside. Difficult to determine from patient who is confused, disoriented and stuporous any descriptive features about her current pain but her daughter says it is all in her back and right side.She has a known L3 and T12 compression fracture and right 7th and 8th rib fractures.   Recommendations:  1. Spoke with family and discussed how devastating and serious falls in the very elderly can be with painful compression and rib fractures- she is splinting her breaths, not moving and with aggressive symptom management has mental status changes- some of this is difficult to avoid if we are to control her suffering unfortunately. But this family wants her comfort and pain relief to be the main goal- because the rest really doesn't matter if the quality of her life is agony. She is a DNR, functional prior to admission.  2. Medication Management for pain and agitation: Pain is driving her agitation unless proven otherwise with fever or signs of infection, avoid anti-psychotics or benzodiazapine for now. Will take a multi-modal approach:  Schedule Morphine 2mg  every 4  hours- need to have a baseline pain med- right now she cannot swallow pills safely due to confusion, can also have a 2mg  breakthrough dose q1PRN-will see what her usage is over next 12-24 hours.  Add additional lidoderm patch over Right anterior 7-9 ribs  Will use IV Tylenol every 6 hours  Calcitonin is the standard of are for acute and subacute compression fractures for pain control- I have started this. Avoid NSAIDS for now with GIB- could consider topical.  If kyphoplasty can be done it may be opioid sparing and help   Apply heat or ice-which ever is tolerable to affected area.  3. Patient is DNR, otherwise full scope treatment and attempt to improve her QOL and functional status.  Will follow.  Lane Hacker, DO Palliative Medicine [phone]

## 2017-06-28 NOTE — Progress Notes (Signed)
  Echocardiogram 2D Echocardiogram has been performed.  Christine Perez 06/28/2017, 8:32 AM

## 2017-06-29 LAB — CREATININE, SERUM
CREATININE: 1.32 mg/dL — AB (ref 0.44–1.00)
GFR calc Af Amer: 40 mL/min — ABNORMAL LOW (ref >60)
GFR calc non Af Amer: 35 mL/min — ABNORMAL LOW (ref >60)

## 2017-06-29 MED ORDER — MORPHINE 100MG IN NS 100ML (1MG/ML) PREMIX INFUSION
2.0000 mg/h | INTRAVENOUS | Status: DC
  Administered 2017-06-29: 0.5 mg/h via INTRAVENOUS
  Filled 2017-06-29: qty 100

## 2017-06-29 MED ORDER — MORPHINE BOLUS VIA INFUSION
1.0000 mg | INTRAVENOUS | Status: DC | PRN
  Administered 2017-06-29 – 2017-06-30 (×10): 1 mg via INTRAVENOUS
  Filled 2017-06-29: qty 1

## 2017-06-29 NOTE — Telephone Encounter (Signed)
Okay for verbal orders. 

## 2017-06-29 NOTE — Progress Notes (Signed)
Spoke with RN re: patient's pain control. 6mg  of IV morphine used since 7AM. Will initiate an infusion at 0.5mg /hr with 1mg  breakthrough dose more frequently to see if we can get a better balance of awake and pain controlled vs. Sedated and pain controlled. Family really struggling to see her suffering. Would still recommend vertebroplasty given her before admission/before fall functional status- if we can spare opioids she may be able to improve otherwise we may in a comfort care situation. Will follow up this afternoon once infusion is started.  Lane Hacker, DO Palliative Medicine

## 2017-06-29 NOTE — Progress Notes (Signed)
   06/29/17 1540  Clinical Encounter Type  Visited With Patient and family together  Visit Type Initial;Spiritual support  Spiritual Encounters  Spiritual Needs Prayer   Rounding on Palliative Patients.  Daughter at bedside.  She indicated her brother would be here tomorrow.  Daughter stated she hopes new medicine will help as her mother seems to be uncomfortable and restless.  Daughter wishes for her mom to be comfortable and as pain free as possible.  We prayed together and the daughter asked to be checked on again.  Will follow and support as needed. Chaplain Katherene Ponto

## 2017-06-29 NOTE — Progress Notes (Signed)
SLP Cancellation Note  Patient Details Name: Christine Perez MRN: [id number] DOB: [birth year]   Cancelled treatment:       Reason Eval/Treat Not Completed: (pt currently is sound asleep, will continue efforts)   Luanna Salk, [place] [facility] SLP [phone]

## 2017-06-29 NOTE — Progress Notes (Signed)
PROGRESS NOTE    Christine Perez  RSW:[id number] DOB: [birth year] DOA: 07/07/2017 PCP: Marletta Lor, MD    Brief Narrative:PollyLawrenceis a89 y.o.femalewith past medical history significant for a fib on amiodarone, Xarelto, and a pacemaker, history of hypertension and history of fall in February with vertebral fracture on nonsteroidals and hydrocodone for pain control, comes in for rectal bleeding and syncopal episode. She was found to have rib fractures, acute to chronic compression fractures of the thoracic and lumbar vertebra. IR consulted and plan for kyphoplasty after insurance authorization. On 4./13, pt was found to be tachpnea, requiring 2l it of [place] oxygen and CXR shows pulmonary edema.     Assessment & Plan:   Active Problems:   Hypothyroidism   Dyslipidemia   Essential hypertension   Atrial fibrillation (HCC)   Chronic anticoagulation   SSS (sick sinus syndrome) (HCC)   CHF (congestive heart failure) (HCC)   Syncope   Rib fracture   Compression fracture of T12 vertebra (HCC)   Compression fracture of L3 lumbar vertebra, sequela   Severe back pain   Fall at home, sequela   GIB (gastrointestinal bleeding)   Syncope following a BM: Possibly vaso vagal syncope.  Pt has pacemaker  Sec to SSS.  PT evaluation recommending SNF.  Echocardiogram is not significant for acute abnormalities.   Rectal bleed:  Unclear etiology. Hemoccult positive. Drop in hemoglobin to 9 from baseline of 12.  Transfuse to keep hemoglobin greater than 7.  Holding anti coagulation. Hemoglobin stabilized around 10.  Pt does not want any endoscopic procedures at this time  GI on board and recommended getting CT abd and pelvis, which showed mild colitis.  Resume PPI. No further episodes of rectal bleeding.    Right flank pain:  Unclear etiology.  US renal does not show any obstruction.    Right sided lower chest wall pain : Secondary to 7th and 8 th rib fractures.  Pain control.    Discussed with the patient and her daughter at bedside.    Elevated liver function panel: Unclear etiology. ? Sec to amiodarone vs liver congestion from CHF. CT abd and pelvis does not show any liver abnormalities.  WILL hold the amiodarone.     PAF: Rate controlled and amiodarone held for elevated liver enzymes.  Not on anti coagulation due to rectal bleed.    Elevated troponin: Flat trend. No ischemic changes.    Hypothyroidism: Resume synthroid.   Upper back pain : Ordered bone scan to evaluate for acuity of the compression fractures.  Requested IR for evaluation of kyphoplasty.  Bone scan shows chronic T12 fracture with acute component. Plan for kyphoplasty after insurance authorization.    Acute respiratory failure with hypoxia and tachypnea: Sec to pulm edema and possibly underlying pneumonia where she had her rib fractures. She was started on broad spectrum antibiotics.  Lasix started , but had to be stopped as she is taking nothing by mouth, . . Echocardiogram ordered and reviewed.    Decreased urine output, decreased po intake, restless and moaning in pain,  She is clinically declining. Appears to be in pain even with 2 mg of morphine every 2 hours. She wil need low maintenance morphine drip. Appreciate palliative care managing her pain and goals of care . Daughter and son updated at bedside on 4/ 14 and spoke to daughter on 4/ 15.     DVT prophylaxis: scd's Code Status:DNR.  Family Communication: discussed with daughter . Disposition Plan: decline over the last  48 hours. Will need goc for disposition. Possibly transition to comfort care if no improvement.   Consultants:   GI.   IR.   Palliative care.    Procedures: CT abd and pelvis.   BONE SCAN.    Antimicrobials: none.    Subjective: Moaning and restless.  Confused .   Objective: Vitals:   06/28/17 2148 06/29/17 0653 06/29/17 0707 06/29/17 1505  BP: 132/62 (!) 97/42  93/70  Pulse: 72  74  76  Resp: 15 16  (!) 21  Temp: 98 F (36.7 C) 97.8 F (36.6 C)    TempSrc: Axillary Axillary    SpO2: 92% (!) 89% 90% 92%  Weight:      Height:        Intake/Output Summary (Last 24 hours) at 06/29/2017 1558 Last data filed at 06/29/2017 0700 Gross per 24 hour  Intake 200 ml  Output 300 ml  Net -100 ml   Filed Weights   07/11/2017 1700 06/27/2017 2359  Weight: 59 kg (130 lb) 55.4 kg (122 lb 2.2 oz)    Examination:  General exam: in mild distress requiring 2l it [place] oxygen.  Respiratory system: crackles at bases, no wheezing heard. Air entry fair.  Cardiovascular system: S1 & S2 heard, RRR. No JVD,  No pedal edema. Gastrointestinal system: soft non distended bowel sounds heard.  Central nervous system: confused, not able to recognize the family.  Extremities: no cyanosis or clubbing.  Skin: No rashes, lesions or ulcers Psychiatry:  Restless and slightly agitated.     Data Reviewed: I have personally reviewed following labs and imaging studies  CBC: Recent Labs  Lab 06/26/2017 1735 06/24/17 0018 06/24/17 0557 06/24/17 0816 06/24/17 1224 06/25/17 0459 06/26/17 0540 06/28/17 0448  WBC 9.3  --  8.9  --   --  7.2 9.4 10.3  NEUTROABS 8.3*  --   --   --   --   --   --   --   HGB 12.2 9.9* 9.4* 10.6* 10.0* 8.9* 10.0* 8.5*  HCT 37.9 31.3* 29.5*  --   --  27.7* 31.5* 27.4*  MCV 102.2*  --  103.1*  --   --  103.0* 104.0* 102.6*  PLT 121*  --  83*  --   --  85* 108* 80*   Basic Metabolic Panel: Recent Labs  Lab 06/20/2017 1735 06/24/17 1224 06/25/17 0459 06/26/17 0540 06/28/17 0448 06/29/17 0516  NA 140 142 142 142 147*  --   K 3.9 3.5 3.4* 4.4 3.9  --   CL 102 109 111 113* 115*  --   CO2 26 23 21* 19* 22  --   GLUCOSE 145* 93 74 103* 87  --   BUN 33* 34* 32* 30* 34*  --   CREATININE 1.83* 1.98* 1.60* 1.37* 1.36* 1.32*  CALCIUM 10.2 8.8* 8.7* 9.4 9.9  --    GFR: Estimated Creatinine Clearance: 21.3 mL/min (A) (by C-G formula based on SCr of 1.32 mg/dL  (H)). Liver Function Tests: Recent Labs  Lab 06/29/2017 1735 06/25/17 0459 06/26/17 0540 06/28/17 0448  AST 360* 263* 267* 198*  ALT 326* 253* 282* 228*  ALKPHOS 185* 139* 170* 151*  BILITOT 1.6* 1.4* 1.4* 1.5*  PROT 6.8 4.9* 5.7* 5.3*  ALBUMIN 2.9* 2.1* 2.5* 2.3*   No results for input(s): LIPASE, AMYLASE in the last 168 hours. No results for input(s): AMMONIA in the last 168 hours. Coagulation Profile: Recent Labs  Lab 06/22/2017 1735  INR 1.04  Cardiac Enzymes: Recent Labs  Lab 06/19/2017 1735 06/24/17 0018 06/24/17 0557 06/24/17 1224  TROPONINI 0.05* 0.04* 0.05* 0.04*   BNP (last 3 results) No results for input(s): PROBNP in the last 8760 hours. HbA1C: No results for input(s): HGBA1C in the last 72 hours. CBG: Recent Labs  Lab 06/18/2017 1754  GLUCAP 135*   Lipid Profile: No results for input(s): CHOL, HDL, LDLCALC, TRIG, CHOLHDL, LDLDIRECT in the last 72 hours. Thyroid Function Tests: No results for input(s): TSH, T4TOTAL, FREET4, T3FREE, THYROIDAB in the last 72 hours. Anemia Panel: No results for input(s): VITAMINB12, FOLATE, FERRITIN, TIBC, IRON, RETICCTPCT in the last 72 hours. Sepsis Labs: No results for input(s): PROCALCITON, LATICACIDVEN in the last 168 hours.  No results found for this or any previous visit (from the past 240 hour(s)).       Radiology Studies: Dg Chest 2 View  Result Date: 06/27/2017 CLINICAL DATA:  Shortness of Breath EXAM: CHEST - 2 VIEW COMPARISON:  June 23, 2017 FINDINGS: There are pleural effusions bilaterally with interstitial edema. There is cardiomegaly with pulmonary venous hypertension. There is patchy airspace consolidation in the bases. Pacemaker leads are attached to the right atrium and right ventricle. No adenopathy. Bones appear osteoporotic. IMPRESSION: Findings felt to be indicative of congestive heart failure. Superimposed pneumonia in the bases cannot be excluded, although opacity in these areas may represent  alveolar edema. Both alveolar edema and pneumonia may be present concurrently. No adenopathy evident. Pacemaker leads attached to right atrium and right ventricle. Electronically Signed   By: Lowella Grip III M.D.   On: 06/27/2017 16:34        Scheduled Meds: . bisacodyl  5 mg Oral Daily  . calcitonin (salmon)  1 spray Alternating Nares Daily  . chlorhexidine  15 mL Mouth Rinse BID  . levothyroxine  75 mcg Oral QAC breakfast  . lidocaine  2 patch Transdermal Q24H  . mouth rinse  15 mL Mouth Rinse q12n4p  . metoprolol succinate  25 mg Oral BID  . pantoprazole  40 mg Oral Daily   Continuous Infusions: . acetaminophen Stopped (06/29/17 1443)  . ceFEPime (MAXIPIME) IV Stopped (06/29/17 1443)  . morphine 0.5 mg/hr (06/29/17 1444)  . [START ON 07-22-17] vancomycin       LOS: 6 days    Time spent: 35 minutes    Hosie Poisson, MD [facility] Pager [phone]  If 7PM-7AM, please contact night-coverage [email or web address] Password [facility] 06/29/2017, 3:58 PM

## 2017-06-30 ENCOUNTER — Inpatient Hospital Stay (HOSPITAL_COMMUNITY): Payer: Medicare Other

## 2017-06-30 DIAGNOSIS — N179 Acute kidney failure, unspecified: Secondary | ICD-10-CM

## 2017-06-30 LAB — CBC
HCT: 31.3 % — ABNORMAL LOW (ref 36.0–46.0)
HEMOGLOBIN: 9.5 g/dL — AB (ref 12.0–15.0)
MCH: 33 pg (ref 26.0–34.0)
MCHC: 30.4 g/dL (ref 30.0–36.0)
MCV: 108.7 fL — AB (ref 78.0–100.0)
PLATELETS: 100 10*3/uL — AB (ref 150–400)
RBC: 2.88 MIL/uL — AB (ref 3.87–5.11)
RDW: 19.3 % — ABNORMAL HIGH (ref 11.5–15.5)
WBC: 10.5 10*3/uL (ref 4.0–10.5)

## 2017-06-30 LAB — BASIC METABOLIC PANEL
ANION GAP: 12 (ref 5–15)
BUN: 43 mg/dL — ABNORMAL HIGH (ref 6–20)
CALCIUM: 10.2 mg/dL (ref 8.9–10.3)
CHLORIDE: 117 mmol/L — AB (ref 101–111)
CO2: 17 mmol/L — ABNORMAL LOW (ref 22–32)
CREATININE: 1.69 mg/dL — AB (ref 0.44–1.00)
GFR calc non Af Amer: 26 mL/min — ABNORMAL LOW (ref >60)
GFR, EST AFRICAN AMERICAN: 30 mL/min — AB (ref >60)
Glucose, Bld: 60 mg/dL — ABNORMAL LOW (ref 65–99)
Potassium: 4.3 mmol/L (ref 3.5–5.1)
SODIUM: 146 mmol/L — AB (ref 135–145)

## 2017-06-30 MED ORDER — MORPHINE BOLUS VIA INFUSION
2.0000 mg | INTRAVENOUS | Status: DC | PRN
Start: 2017-06-30 — End: 2017-07-01
  Administered 2017-06-30: 2 mg via INTRAVENOUS
  Filled 2017-06-30: qty 2

## 2017-06-30 MED ORDER — GLYCOPYRROLATE 0.2 MG/ML IJ SOLN
0.1000 mg | INTRAMUSCULAR | Status: DC | PRN
  Filled 2017-06-30: qty 0.5

## 2017-07-15 NOTE — Progress Notes (Signed)
Patient has declined over past 24 hours. Unresponsive, worsening hypoxia. Pain with movement and turning. Discussed condition with family at bedside. Goals are for dignity and comfort at end of life. I recommended hospice facility care if she is stable enough to be moved. Supported them in their grief and in answering questions.  Full Comfort Increased Morphine infusion to 2mg /hr based on last 24h bolus dosing Robinul added for secretions. CSW consult placed-hopefully we can get her to HOTP in the [place], DO Palliative Medicine   Time: 35 min Greater than 50%  of this time was spent counseling and coordinating care related to the above assessment and plan.

## 2017-07-15 NOTE — Progress Notes (Signed)
Wasted ~88mls of IV Morphine 100mg /171ml.  Witnessed by Randa Lynn, RN

## 2017-07-15 NOTE — Progress Notes (Signed)
Pt found to be deceased at 20:55. Notified Dr. Mitzi Hansen, Notified pts Daughter Renella Cunas. Notified Medical Examiner due to pt being admitted from a fall at home. Notified bed placement.

## 2017-07-15 NOTE — Discharge Summary (Addendum)
Death Summary  DAKIYAH HEINKE NFA:[id number] DOB: 15-Jun-1925 DOA: 2017-06-24  PCP: Marletta Lor, MD  Admit date: 2017-06-24 Date of Death: 2017/07/01 Time of Death: 8:55 pm   History of present illness:  PollyLawrenceis a84 y.o.femalewith past medical history significant fora fibon amiodarone, Xarelto, and a pacemaker, history of hypertension and history of fall in February with vertebral fracture on nonsteroidals and hydrocodone for pain control, comes in for rectal bleeding and syncopal episode. She was found to have rib fractures, acute to chronic compression fractures of the thoracic and lumbar vertebra. IR consulted and plan for kyphoplasty after insurance authorization. On 4./13, pt was found to be tachpnea, requiring  oxygen and CXR shows pulmonary edema. She continued to decline, requiring about 4 lit of [place] oxygen, CXR shows worsening aeration and pulmonary edema, decreased urine output, with no po intake . Palliative care consulted and she was transitioned to comfort care.    Final Diagnoses:  acute respiratory failure.  Acute renal failure.  Acute on chronic back pain.  Hypothyroidism   Dyslipidemia   Essential hypertension   Atrial fibrillation (HCC)   Chronic anticoagulation   SSS (sick sinus syndrome) (HCC)   CHF (congestive heart failure) (HCC)   Syncope   Rib fracture   Compression fracture of T12 vertebra (HCC)   Compression fracture of L3 lumbar vertebra, sequela   Severe back pain   Fall at home, sequela   GIB (gastrointestinal bleeding) Acute blood loss anemia Acute on chronic diastolic heart failure.         The results of significant diagnostics from this hospitalization (including imaging, microbiology, ancillary and laboratory) are listed below for reference.    Significant Diagnostic Studies: Dg Chest 2 View  Result Date: 06/27/2017 CLINICAL DATA:  Shortness of Breath EXAM: CHEST - 2 VIEW COMPARISON:  06/24/2017 FINDINGS: There  are pleural effusions bilaterally with interstitial edema. There is cardiomegaly with pulmonary venous hypertension. There is patchy airspace consolidation in the bases. Pacemaker leads are attached to the right atrium and right ventricle. No adenopathy. Bones appear osteoporotic. IMPRESSION: Findings felt to be indicative of congestive heart failure. Superimposed pneumonia in the bases cannot be excluded, although opacity in these areas may represent alveolar edema. Both alveolar edema and pneumonia may be present concurrently. No adenopathy evident. Pacemaker leads attached to right atrium and right ventricle. Electronically Signed   By: Lowella Grip III M.D.   On: 06/27/2017 16:34   Dg Chest 2 View  Result Date: 06/10/2017 CLINICAL DATA:  Weakness EXAM: CHEST - 2 VIEW COMPARISON:  05/03/2017, 08/01/2016 FINDINGS: Trace pleural effusion. No focal consolidation. Mild cardiomegaly with aortic atherosclerosis. Left-sided pacing device. Kyphosis of the spine with marked compression fracture of lower thoracic vertebra and upper lumbar vertebra. IMPRESSION: 1. Mild cardiomegaly with trace pleural effusion or thickening. No focal opacity 2. Marked compression fracture deformities at the lower thoracic and upper lumbar spine Electronically Signed   By: Donavan Foil M.D.   On: 06/10/2017 21:53   Dg Lumbar Spine Complete  Result Date: 06/24/17 CLINICAL DATA:  Low back pain. Right flank pain. Syncopal episode. Initial encounter. EXAM: LUMBAR SPINE - COMPLETE 4+ VIEW COMPARISON:  05/14/2017 FINDINGS: There is transitional lumbosacral anatomy, and the transitional segment will be considered a partially lumbarized S1 for consistency with the prior study. The bones are diffusely osteopenic. An L3 compression fracture is again seen with similar severe vertebral body height loss. A mild T12 compression fracture is less well profiled on today's lateral  radiograph than on the prior study but is without grossly  progressive vertebral body height loss. No new compression fracture is identified. Mild upper lumbar levoscoliosis is noted. No significant listhesis is seen. Disc space narrowing at L2-3 is similar to the prior study. Mild lower lumbar facet arthrosis is noted. There is aortic atherosclerosis. IMPRESSION: Chronic lumbar and lower thoracic compression fractures without evidence of acute osseous abnormality. Electronically Signed   By: Logan Bores M.D.   On: 06/27/2017 18:39   Ct Head Wo Contrast  Result Date: 06/10/2017 CLINICAL DATA:  Generalized weakness beginning last night, nausea and vomiting. History of atrial fibrillation, hypertension and hyperlipidemia. EXAM: CT HEAD WITHOUT CONTRAST TECHNIQUE: Contiguous axial images were obtained from the base of the skull through the vertex without intravenous contrast. COMPARISON:  CT HEAD May 03, 2017 FINDINGS: BRAIN: No intraparenchymal hemorrhage, mass effect nor midline shift. Old mesial RIGHT occipital lobe encephalomalacia. Old bilateral basal ganglia lacunar infarcts. Patchy to confluent supratentorial white matter hypodensities. No abnormal extra-axial fluid collections. Basal cisterns are patent. VASCULAR: Mild calcific atherosclerosis of the carotid siphons. SKULL: No skull fracture. No significant scalp soft tissue swelling. SINUSES/ORBITS: The mastoid air-cells and included paranasal sinuses are well-aerated.The included ocular globes and orbital contents are non-suspicious. Status post bilateral ocular lens implants. OTHER: None. IMPRESSION: 1. No acute intracranial process. 2. Stable examination including moderate chronic small vessel ischemic disease and old RIGHT occipital lobe/PCA territory infarct. Electronically Signed   By: Elon Alas M.D.   On: 06/10/2017 22:06   Nm Bone Scan Whole Body  Result Date: 06/26/2017 CLINICAL DATA:  84 year old who fell in February, 2019, with impact to the low back and ribs. Patient states that she  has had prior compression fractures and rib fractures. EXAM: NUCLEAR MEDICINE WHOLE BODY BONE SCAN TECHNIQUE: Whole body anterior and posterior images were obtained approximately 3 hours after intravenous injection of radiopharmaceutical. RADIOPHARMACEUTICALS:  20.2 mCi Technetium-46m MDP IV COMPARISON:  No prior nuclear bone scan. Bone window images from CT abdomen and pelvis performed earlier same date. Lumbar spine x-rays 06/21/2017, 05/14/2017. FINDINGS: Symmetric marked focal abnormal activity involving the POSTERIOR elements of T12 bilaterally. Symmetric mild focal abnormal activity involving the POSTERIOR elements of L3 bilaterally. Prior imaging has demonstrated chronic compression fractures at both levels. Degenerative activity involving the sternoclavicular joints, the acromioclavicular joint, the junction of the sternal body and manubrium, the bilateral first MTP joints. IMPRESSION: 1. Marked abnormal activity involving T12. The patient has a chronic T12 compression fracture, though the abnormal activity on this examination suggests that there is an acute or subacute component. 2. Mild abnormal activity involving L3, likely related to healing of the remote compression fracture at this level. 3. No abnormal activity elsewhere to suggest acute or subacute fractures. Electronically Signed   By: Evangeline Dakin M.D.   On: 06/26/2017 14:22   Ct Abdomen Pelvis W Contrast  Result Date: 06/26/2017 CLINICAL DATA:  Inpatient. Right abdominal and back pain. Nausea. Weight loss. Anorexia. Constipation. EXAM: CT ABDOMEN AND PELVIS WITH CONTRAST TECHNIQUE: Multidetector CT imaging of the abdomen and pelvis was performed using the standard protocol following bolus administration of intravenous contrast. CONTRAST:  26mL OMNIPAQUE IOHEXOL 300 MG/ML  SOLN COMPARISON:  06/25/2017 renal sonogram. 05/29/2017 abdominal sonogram. FINDINGS: Lower chest: Small dependent bilateral pleural effusions, right greater than left.  Moderate dependent atelectasis at both lung bases. Cardiomegaly. Pacer leads are seen in the right atrium and right ventricular apex. Coronary atherosclerosis. Hepatobiliary: Normal liver size. No liver mass. Cholecystectomy.  Bile ducts are within normal post cholecystectomy limits with CBD diameter 9 mm. No radiopaque choledocholithiasis. Pancreas: Normal, with no mass or duct dilation. Spleen: Normal size. No mass. Adrenals/Urinary Tract: Normal adrenals. No hydronephrosis. Subcentimeter hypodense renal cortical lesions in the upper left kidney are too small to characterize and require no follow-up. Bladder collapsed by indwelling Foley catheter and appears normal. Stomach/Bowel: Normal non-distended stomach. Normal caliber small bowel with no small bowel wall thickening. Appendectomy. Collapsed large bowel. Moderate sigmoid diverticulosis. Questionable mild wall thickening in the right colon without significant pericolonic fat stranding. No additional sites of large bowel wall thickening or significant pericolonic fat stranding. Vascular/Lymphatic: Atherosclerotic nonaneurysmal abdominal aorta. Patent portal, splenic, hepatic and renal veins. No pathologically enlarged lymph nodes in the abdomen or pelvis. Reproductive: Status post hysterectomy, with no abnormal findings at the vaginal cuff. No adnexal mass. Other: No pneumoperitoneum, ascites or focal fluid collection. Nonspecific ill-defined mild presacral free fluid. Mild anasarca. Musculoskeletal: No aggressive appearing focal osseous lesions. Subacute healing anterior right seventh and eighth rib fractures. Moderate to severe T12 vertebral compression fracture demonstrates mild worsening of vertebral body height loss since 05/14/2017 radiographs. Severe L3 vertebral compression fracture is stable since 05/14/2017 radiographs. Mild thoracolumbar spondylosis. IMPRESSION: 1. Healing subacute anterior right seventh and eighth rib fractures. 2. Moderate to severe  T12 vertebral compression fracture with mild worsening of vertebral body height loss since 05/14/2017 radiographs. Chronic severe L3 vertebral compression fracture is stable. 3. Third-spacing of fluid with small bilateral pleural effusions, mild anasarca and mild presacral fluid. Cardiomegaly. 4. Mild right colonic wall thickening may be due to hypoalbuminemia. Mild right colitis cannot be strictly excluded. No bowel obstruction. 5. Chronic findings include: Aortic Atherosclerosis (ICD10-I70.0). Coronary atherosclerosis. Moderate sigmoid diverticulosis. Electronically Signed   By: Ilona Sorrel M.D.   On: 06/26/2017 12:51   US Renal  Result Date: 06/25/2017 CLINICAL DATA:  Acute kidney injury. EXAM: RENAL / URINARY TRACT ULTRASOUND COMPLETE COMPARISON:  Ultrasound of the abdomen 05/29/2017 only examined the RIGHT upper quadrant. FINDINGS: Right Kidney: Length: 8.8 cm. Echogenicity within normal limits. No mass or hydronephrosis visualized. Left Kidney: Length: 8.3 cm. Echogenicity within normal limits. No mass or hydronephrosis visualized. Bladder: Decompressed by Foley catheter. IMPRESSION: Negative exam.  No hydronephrosis or abnormal echogenicity. Electronically Signed   By: Staci Righter M.D.   On: 06/25/2017 11:42   Dg Chest Port 1 View  Result Date: 07-26-2017 CLINICAL DATA:  Atrial fibrillation.  Syncopal episode. EXAM: PORTABLE CHEST 1 VIEW COMPARISON:  06/27/2017. FINDINGS: Rotated radiograph is suboptimal. Cardiomegaly. Dual lead pacer. BILATERAL pulmonary opacities, RIGHT greater than LEFT with effusions, most consistent with pulmonary edema. Worsening aeration from priors. Osteopenia. IMPRESSION: Cardiomegaly with suspected pulmonary edema. Worsening aeration from 06/27/2017. Electronically Signed   By: Staci Righter M.D.   On: Jul 26, 2017 11:50   Dg Abdomen Acute W/chest  Result Date: 07/13/2017 CLINICAL DATA:  84 year old female status post syncopal episode while using the bathroom. Lower  abdominal pain. EXAM: DG ABDOMEN ACUTE W/ 1V CHEST COMPARISON:  Chest radiographs 06/10/2017 and earlier. Lumbar radiographs 05/14/2017. FINDINGS: Semi upright AP view of the chest. Stable cardiomegaly and mediastinal contours. Stable left chest cardiac pacemaker. Calcified aortic atherosclerosis. Stable lung volumes. No pneumothorax, pulmonary edema or acute pulmonary opacity. Chronic blunting of the right costophrenic angle. No pneumoperitoneum. Non obstructed bowel gas pattern. Left-side-down lateral decubitus view of the abdomen also demonstrates no free air. Stable visualized osseous structures, including osteopenia and L3 compression fracture. A round 7-8 millimeter calcification projects  to the right of the mid lumbar spine and is stable since February. IMPRESSION: 1.  No acute cardiopulmonary abnormality. 2. Normal bowel gas pattern, no free air. 3. A 7-8 mm right abdominal calcification is stable since February and of uncertain etiology and significance. Correlate for hematuria. Electronically Signed   By: Genevie Ann M.D.   On: 06/29/2017 18:36    Microbiology: No results found for this or any previous visit (from the past 240 hour(s)).   Labs: Basic Metabolic Panel: No results for input(s): NA, K, CL, CO2, GLUCOSE, BUN, CREATININE, CALCIUM, MG, PHOS in the last 168 hours. Liver Function Tests: No results for input(s): AST, ALT, ALKPHOS, BILITOT, PROT, ALBUMIN in the last 168 hours. No results for input(s): LIPASE, AMYLASE in the last 168 hours. No results for input(s): AMMONIA in the last 168 hours. CBC: No results for input(s): WBC, NEUTROABS, HGB, HCT, MCV, PLT in the last 168 hours. Cardiac Enzymes: No results for input(s): CKTOTAL, CKMB, CKMBINDEX, TROPONINI in the last 168 hours. D-Dimer No results for input(s): DDIMER in the last 72 hours. BNP: Invalid input(s): POCBNP CBG: No results for input(s): GLUCAP in the last 168 hours. Anemia work up No results for input(s): VITAMINB12,  FOLATE, FERRITIN, TIBC, IRON, RETICCTPCT in the last 72 hours. Urinalysis    Component Value Date/Time   COLORURINE YELLOW 06/21/2017 2030   APPEARANCEUR HAZY (A) 06/18/2017 2030   APPEARANCEUR Clear 08/04/2016 1553   LABSPEC 1.011 07/06/2017 2030   PHURINE 5.0 07/02/2017 2030   GLUCOSEU NEGATIVE 07/04/2017 2030   [place] NEGATIVE 07/12/2017 2030   [place] 07/08/2017 2030   BILIRUBINUR Negative 08/04/2016 [place] 07/06/2017 2030   PROTEINUR NEGATIVE 07/02/2017 2030   UROBILINOGEN 0.2 09/14/2013 1545   NITRITE NEGATIVE 07/08/2017 2030   LEUKOCYTESUR NEGATIVE 06/15/2017 2030   LEUKOCYTESUR Negative 08/04/2016 1553   Sepsis Labs Invalid input(s): PROCALCITONIN,  WBC,  LACTICIDVEN     SIGNED:  Hosie Poisson, MD  [facility] 07/07/2017, 7:08 PM Pager   If 7PM-7AM, please contact night-coverage [email or web address] Password [email or web address]

## 2017-07-15 NOTE — Progress Notes (Signed)
PROGRESS NOTE    Christine Perez  ASN:[id number] DOB: Oct 15, 1925 DOA: 06/18/2017 PCP: Marletta Lor, MD    Brief Narrative:PollyLawrenceis a89 y.o.femalewith past medical history significant for a fib on amiodarone, Xarelto, and a pacemaker, history of hypertension and history of fall in February with vertebral fracture on nonsteroidals and hydrocodone for pain control, comes in for rectal bleeding and syncopal episode. She was found to have rib fractures, acute to chronic compression fractures of the thoracic and lumbar vertebra. IR consulted and plan for kyphoplasty after insurance authorization. On 4./13, pt was found to be tachpnea, requiring 2l it of [place] oxygen and CXR shows pulmonary edema. She continued to decline, requiring about 4 lit of  oxygen, CXR shows worsening aeration and pulmonary edema, decreased urine output, with no po intake . Palliative care consulted and she was transitioned to comfort care and for residential hospice placement.     Assessment & Plan:   Active Problems:   Hypothyroidism   Dyslipidemia   Essential hypertension   Atrial fibrillation (HCC)   Chronic anticoagulation   SSS (sick sinus syndrome) (HCC)   CHF (congestive heart failure) (HCC)   Syncope   Rib fracture   Compression fracture of T12 vertebra (HCC)   Compression fracture of L3 lumbar vertebra, sequela   Severe back pain   Fall at home, sequela   GIB (gastrointestinal bleeding)   Syncope following a BM: Possibly vaso vagal syncope.  Pt has pacemaker  Sec to SSS.  Echocardiogram is not significant for acute abnormalities.   Rectal bleed:  Unclear etiology. Hemoccult positive. Drop in hemoglobin to 9 from baseline of 12.  Holding anti coagulation. Hemoglobin stabilized around 10.  Pt does not want any endoscopic procedures at this time  GI on board and recommended getting CT abd and pelvis, which showed mild colitis.   No further episodes of rectal bleeding.    Right  flank pain:  Unclear etiology.  US renal does not show any obstruction.    Right sided lower chest wall pain : Secondary to 7th and 8 th rib fractures.  Started her on morphine gtt for pain and comfort.    Elevated liver function panel: Unclear etiology. ? Sec to amiodarone vs liver congestion from CHF. CT abd and pelvis does not show any liver abnormalities.  Holding the amiodarone.     PAF: Rate controlled and amiodarone held for elevated liver enzymes.  Not on anti coagulation due to rectal bleed.    Elevated troponin: Flat trend. No ischemic changes.    Hypothyroidism:   Upper back pain : Ordered bone scan to evaluate for acuity of the compression fractures.  Requested IR for evaluation of kyphoplasty.  Bone scan shows chronic T12 fracture with acute component. Initial plan was to do a kyphoplasty, but in view of her deconditioning, and clinical decline, transition to comfort care, will d/c the kyphoplasty as per family   Acute respiratory failure with hypoxia and tachypnea: Sec to pulm edema and possibly underlying pneumonia where she had her rib fractures. She was started on broad spectrum antibiotics.  Lasix started , but had to be stopped as she is somnolent, taking nothing by mouth,.  Echocardiogram ordered and reviewed.    Decreased urine output, decreased po intake, restless and moaning in pain,  Clinical decline, despite antibiotics, pain control.  Palliative care managing pain control with morphine gtt.  Social work consulted for residential hospice when stable.   DVT prophylaxis: comfort care.  Code Status:DNR.  Family Communication: discussed with daughter , son and wife.. Disposition Plan: decline over the last 48 hours.comfort care.   Consultants:   GI.   IR.   Palliative care.    Procedures: CT abd and pelvis.   BONE SCAN.    Antimicrobials: none.    Subjective: Comfortable.   Objective: Vitals:   2017/07/15 0627 Jul 15, 2017 1010  07-15-2017 1315 07/15/2017 1541  BP: (!) 97/43 90/76 (!) 107/52 (!) 97/52  Pulse: 70 75 68 71  Resp: 13  15   Temp: (!) 97.4 F (36.3 C)     TempSrc:      SpO2: 91% 93% 96%   Weight:      Height:        Intake/Output Summary (Last 24 hours) at 07-15-2017 1655 Last data filed at Jul 15, 2017 1400 Gross per 24 hour  Intake 300 ml  Output 400 ml  Net -100 ml   Filed Weights   06/16/2017 1700 07/08/2017 2359  Weight: 59 kg (130 lb) 55.4 kg (122 lb 2.2 oz)    Examination:  General exam: comfortable on 4lit of [place] oxygen.  Respiratory system: crackles at bases,R> L  Cardiovascular system: S1 & S2 heard, RRR. No JVD,  No pedal edema. Gastrointestinal system: soft bowel sounds good.  Central nervous system: confused, not able to recognize the family.  Extremities: no cyanosis or clubbing.  Skin: No rashes, lesions or ulcers     Data Reviewed: I have personally reviewed following labs and imaging studies  CBC: Recent Labs  Lab 07/01/2017 1735  06/24/17 0557  06/24/17 1224 06/25/17 0459 06/26/17 0540 06/28/17 0448 07-15-2017 0527  WBC 9.3  --  8.9  --   --  7.2 9.4 10.3 10.5  NEUTROABS 8.3*  --   --   --   --   --   --   --   --   HGB 12.2   < > 9.4*   < > 10.0* 8.9* 10.0* 8.5* 9.5*  HCT 37.9   < > 29.5*  --   --  27.7* 31.5* 27.4* 31.3*  MCV 102.2*  --  103.1*  --   --  103.0* 104.0* 102.6* 108.7*  PLT 121*  --  83*  --   --  85* 108* 80* 100*   < > = values in this interval not displayed.   Basic Metabolic Panel: Recent Labs  Lab 06/24/17 1224 06/25/17 0459 06/26/17 0540 06/28/17 0448 06/29/17 0516 Jul 15, 2017 0527  NA 142 142 142 147*  --  146*  K 3.5 3.4* 4.4 3.9  --  4.3  CL 109 111 113* 115*  --  117*  CO2 23 21* 19* 22  --  17*  GLUCOSE 93 74 103* 87  --  60*  BUN 34* 32* 30* 34*  --  43*  CREATININE 1.98* 1.60* 1.37* 1.36* 1.32* 1.69*  CALCIUM 8.8* 8.7* 9.4 9.9  --  10.2   GFR: Estimated Creatinine Clearance: 16.6 mL/min (A) (by C-G formula based on SCr of 1.69  mg/dL (H)). Liver Function Tests: Recent Labs  Lab 07/06/2017 1735 06/25/17 0459 06/26/17 0540 06/28/17 0448  AST 360* 263* 267* 198*  ALT 326* 253* 282* 228*  ALKPHOS 185* 139* 170* 151*  BILITOT 1.6* 1.4* 1.4* 1.5*  PROT 6.8 4.9* 5.7* 5.3*  ALBUMIN 2.9* 2.1* 2.5* 2.3*   No results for input(s): LIPASE, AMYLASE in the last 168 hours. No results for input(s): AMMONIA in the last 168 hours. Coagulation Profile: Recent Labs  Lab 06/29/2017 1735  INR 1.04   Cardiac Enzymes: Recent Labs  Lab 06/21/2017 1735 06/24/17 0018 06/24/17 0557 06/24/17 1224  TROPONINI 0.05* 0.04* 0.05* 0.04*   BNP (last 3 results) No results for input(s): PROBNP in the last 8760 hours. HbA1C: No results for input(s): HGBA1C in the last 72 hours. CBG: Recent Labs  Lab 06/25/2017 1754  GLUCAP 135*   Lipid Profile: No results for input(s): CHOL, HDL, LDLCALC, TRIG, CHOLHDL, LDLDIRECT in the last 72 hours. Thyroid Function Tests: No results for input(s): TSH, T4TOTAL, FREET4, T3FREE, THYROIDAB in the last 72 hours. Anemia Panel: No results for input(s): VITAMINB12, FOLATE, FERRITIN, TIBC, IRON, RETICCTPCT in the last 72 hours. Sepsis Labs: No results for input(s): PROCALCITON, LATICACIDVEN in the last 168 hours.  No results found for this or any previous visit (from the past 240 hour(s)).       Radiology Studies: Dg Chest Port 1 View  Result Date: 07-30-2017 CLINICAL DATA:  Atrial fibrillation.  Syncopal episode. EXAM: PORTABLE CHEST 1 VIEW COMPARISON:  06/27/2017. FINDINGS: Rotated radiograph is suboptimal. Cardiomegaly. Dual lead pacer. BILATERAL pulmonary opacities, RIGHT greater than LEFT with effusions, most consistent with pulmonary edema. Worsening aeration from priors. Osteopenia. IMPRESSION: Cardiomegaly with suspected pulmonary edema. Worsening aeration from 06/27/2017. Electronically Signed   By: Staci Righter M.D.   On: 2017/07/30 11:50        Scheduled Meds: . calcitonin  (sal[place])  1 spray Alternating Nares Daily  . chlorhexidine  15 mL Mouth Rinse BID  . lidocaine  2 patch Transdermal Q24H  . mouth rinse  15 mL Mouth Rinse q12n4p   Continuous Infusions: . morphine 1 mg/hr (07-30-17 1600)     LOS: 7 days    Time spent: 35 minutes    Hosie Poisson, MD [facility] Pager [phone]  If 7PM-7AM, please contact night-coverage [email or web address] Password [email or web address] 07/30/2017, 4:55 PM

## 2017-07-15 NOTE — Telephone Encounter (Signed)
Verbal orders left on voicemail. 

## 2017-07-15 NOTE — Progress Notes (Signed)
SLP Cancellation Note  Patient Details Name: Christine Perez MRN: [id number] DOB: 1925-10-28   Cancelled treatment:       Reason Eval/Treat Not Completed: Other (comment)(RN report pt having severe pain, poor po and no desire for po, palliative on board)  2017/07/05, 1:09 PM  Luanna Salk, [place] [facility] SLP [phone]

## 2017-07-15 DEATH — deceased

## 2018-09-16 IMAGING — CR DG ABDOMEN ACUTE W/ 1V CHEST
3 series · 3 of 3 positions shown · non-contrast
Comparison: Chest radiographs 06/10/2017 and earlier. Lumbar
radiographs 05/14/2017.

CLINICAL DATA: 89-year-old female status post syncopal episode
while using the bathroom. Lower abdominal pain.

EXAM:
DG ABDOMEN ACUTE W/ 1V CHEST

[t abdomen supine]
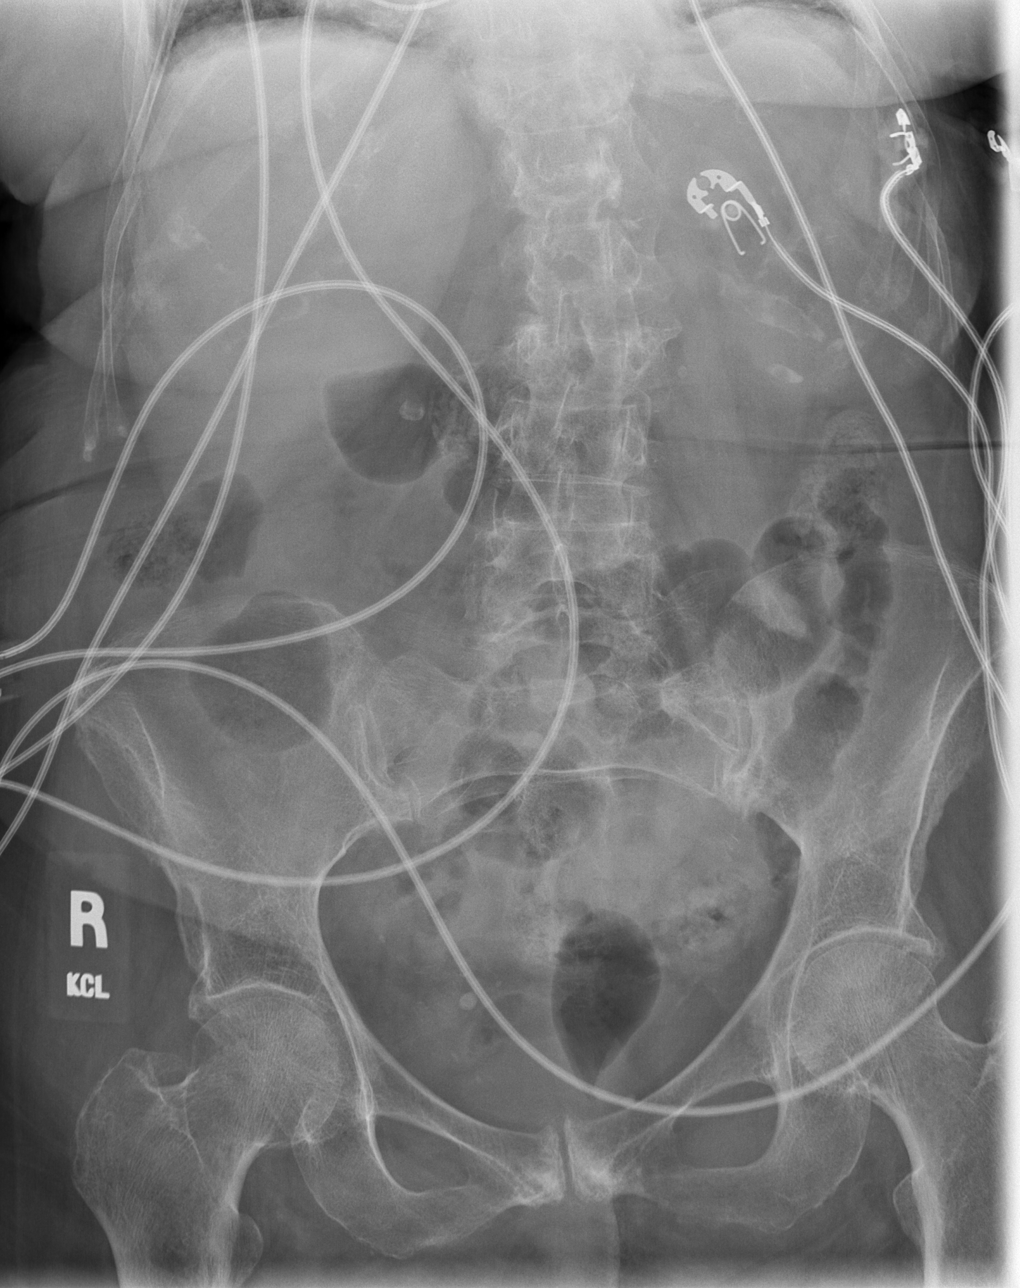

[w abdomen decub]
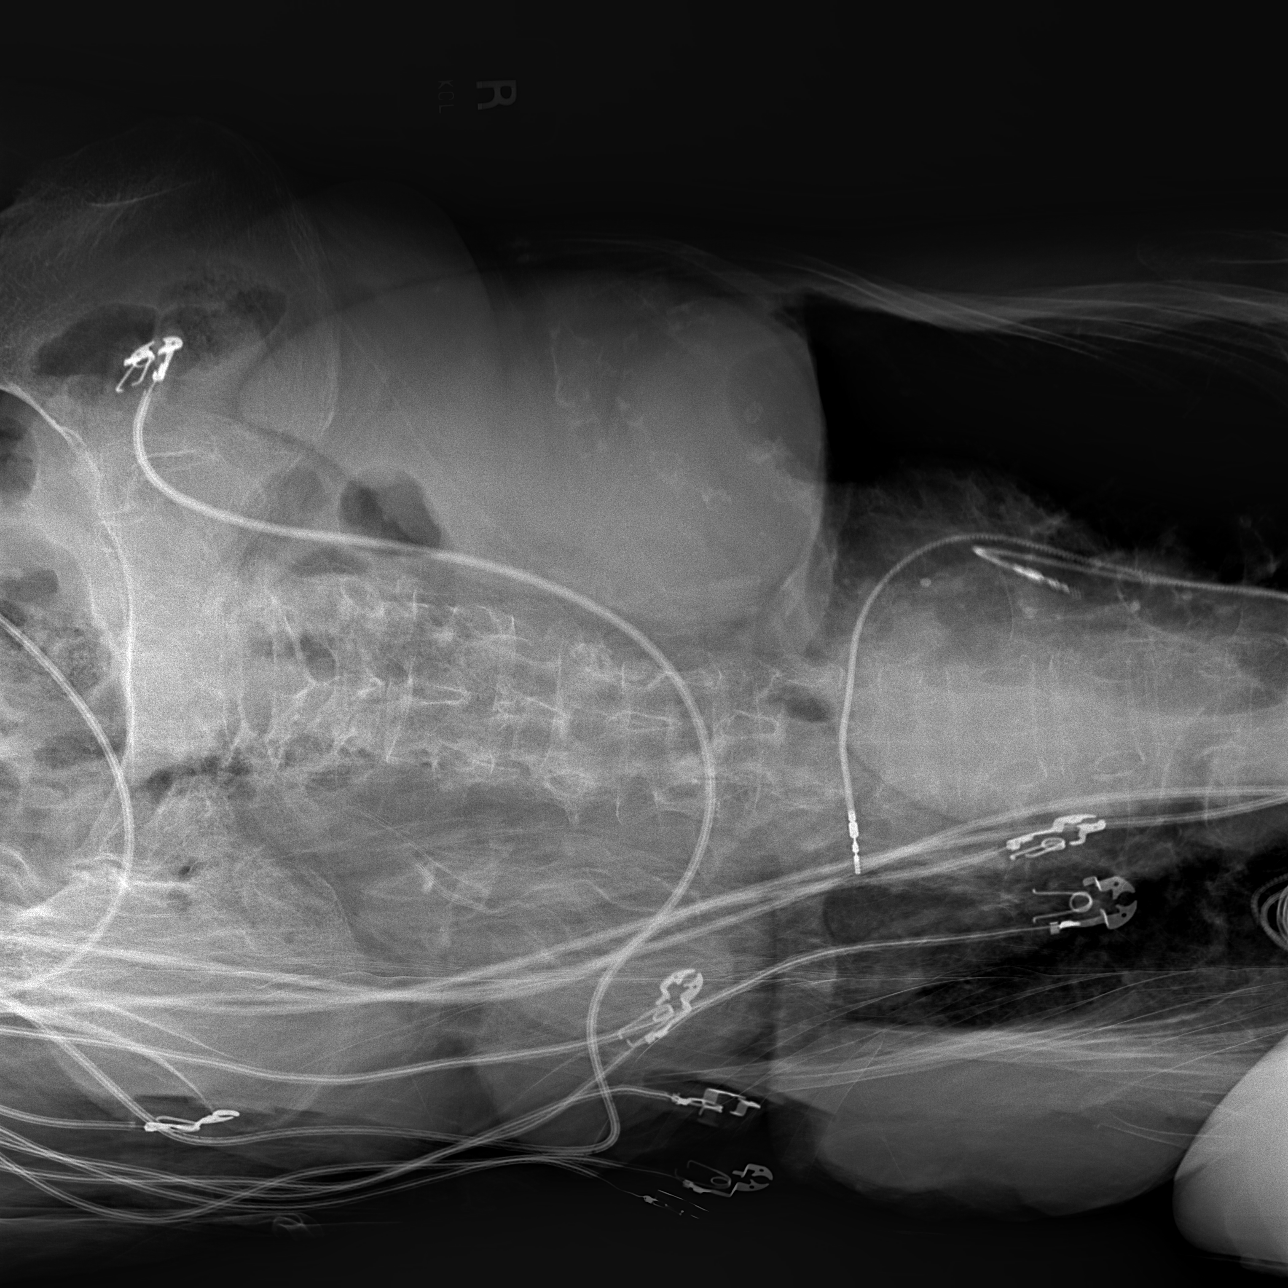

[x chest ap]
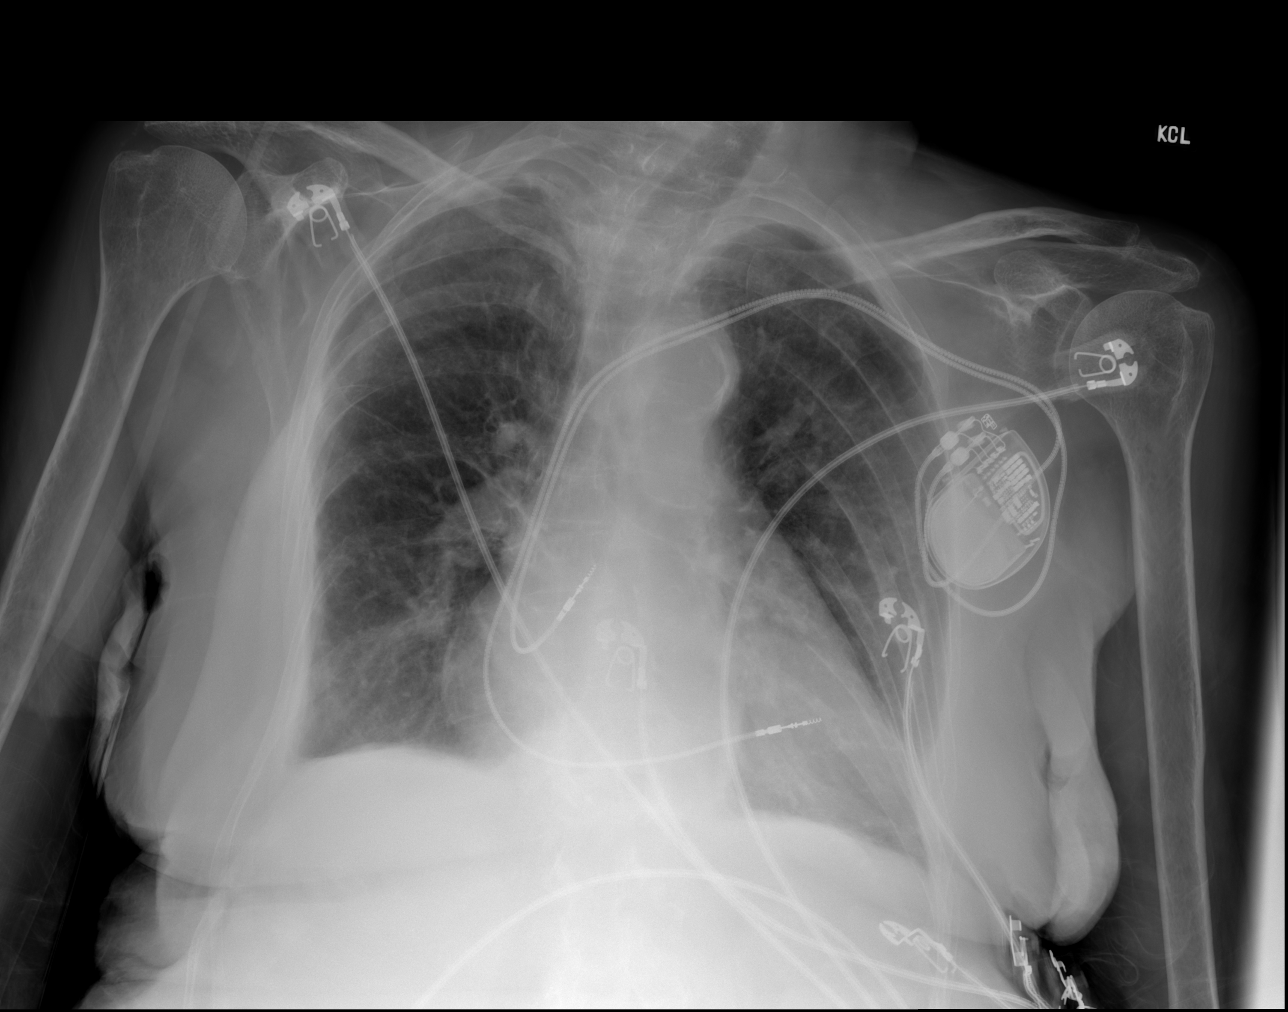

[3 of 3 positions shown; findings below may reference images not displayed]

FINDINGS: Semi upright AP view of the chest. Stable cardiomegaly and
mediastinal contours. Stable left chest cardiac pacemaker. Calcified
aortic atherosclerosis. Stable lung volumes. No pneumothorax,
pulmonary edema or acute pulmonary opacity. Chronic blunting of the
right costophrenic angle. No pneumoperitoneum.

Non obstructed bowel gas pattern. Left-side-down lateral decubitus
view of the abdomen also demonstrates no free air. Stable visualized
osseous structures, including osteopenia and L3 compression
fracture.

A round 7-8 millimeter calcification projects to the right of the
mid lumbar spine and is stable since [REDACTED].
IMPRESSION: 1.  No acute cardiopulmonary abnormality.
2. Normal bowel gas pattern, no free air.
3. A 7-8 mm right abdominal calcification is stable since [REDACTED]
and of uncertain etiology and significance. Correlate for hematuria.

## 2018-09-16 IMAGING — CR DG LUMBAR SPINE COMPLETE 4+V
5 series · 5 of 5 positions shown · non-contrast
Comparison: 05/14/2017

CLINICAL DATA: Low back pain. Right flank pain. Syncopal episode.
Initial encounter.

EXAM:
LUMBAR SPINE - COMPLETE 4+ VIEW

[t lumbar spine ap]
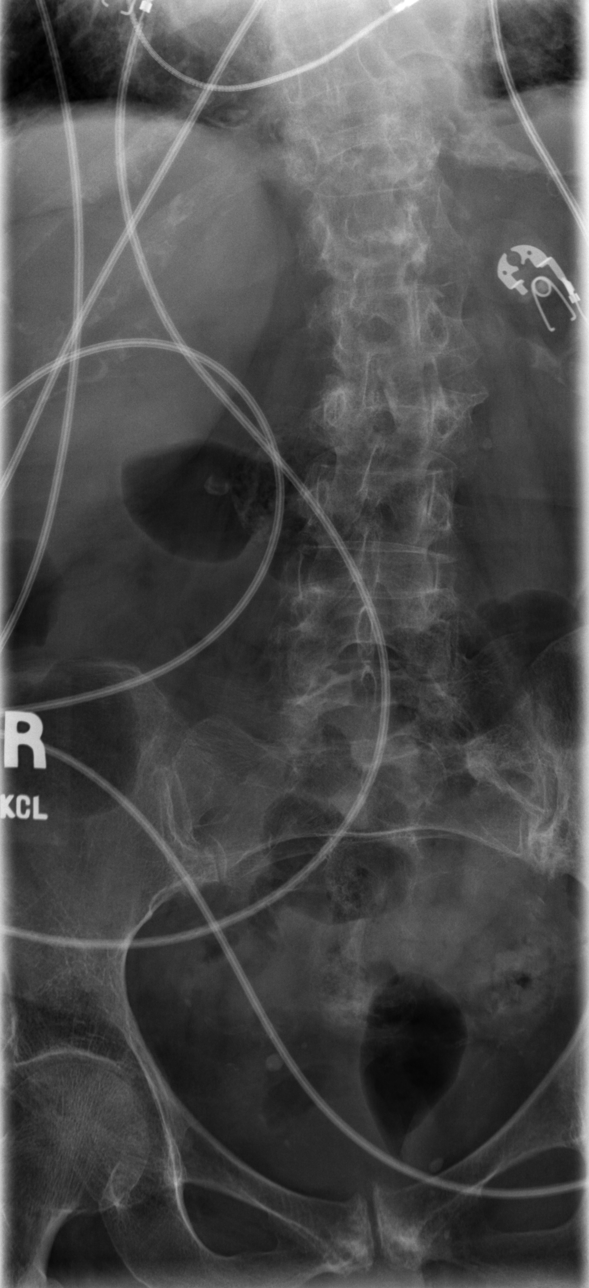

[t lumbar spine obl]
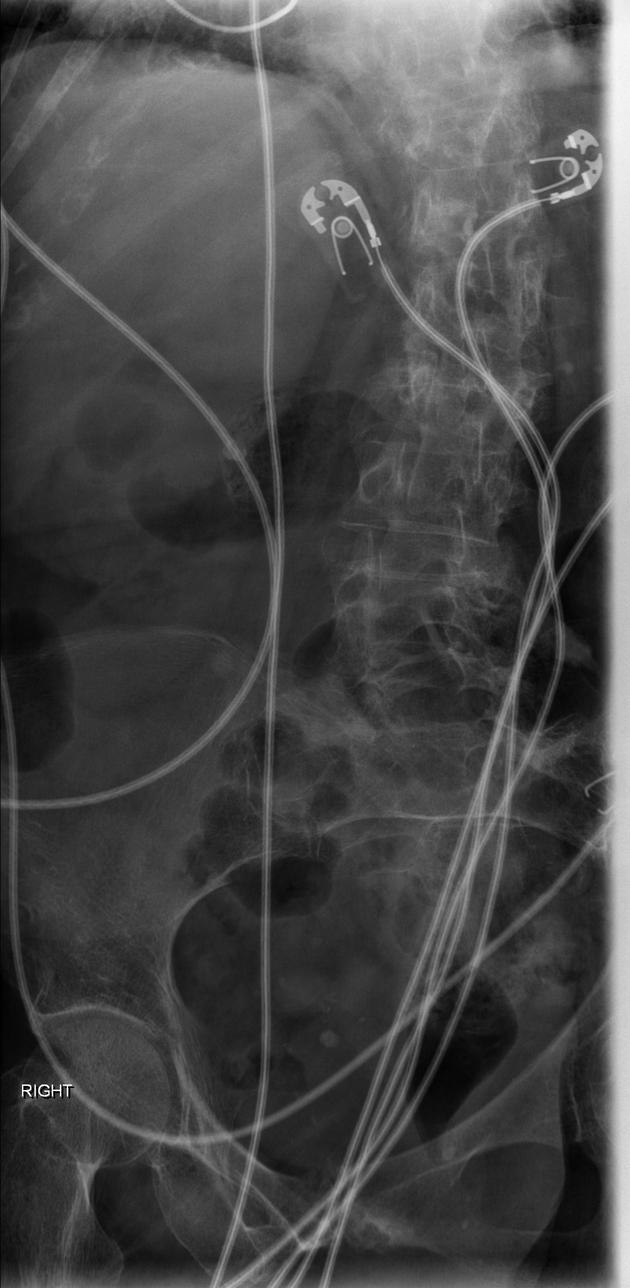

[t lumbar spine lat (1 of 2)]
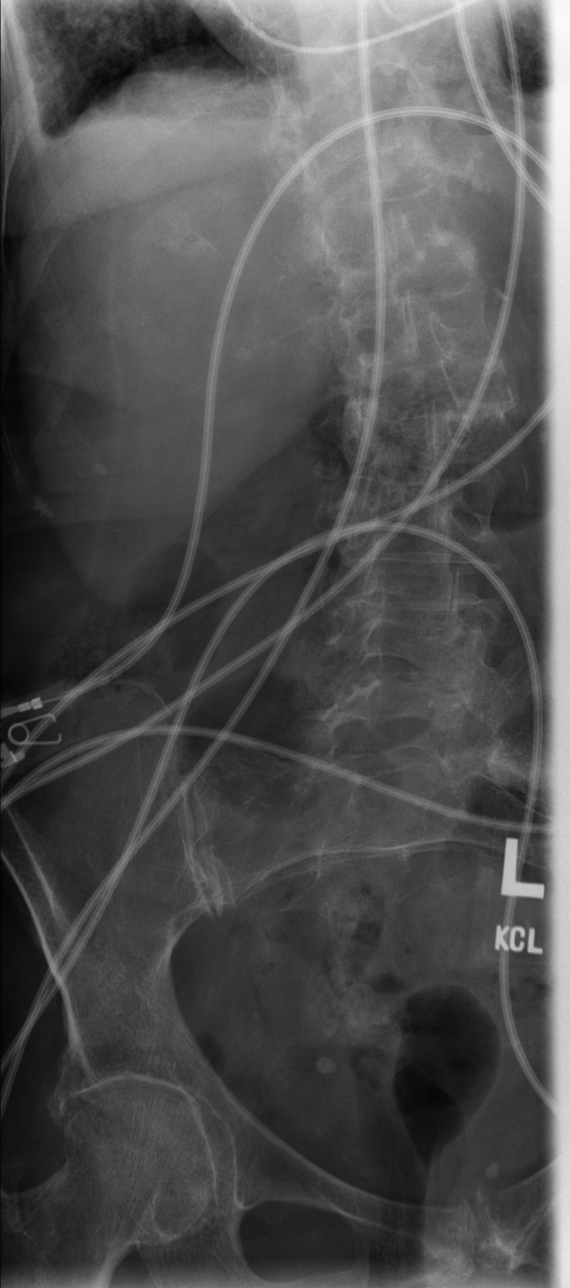

[t lumbar spine lat (2 of 2)]
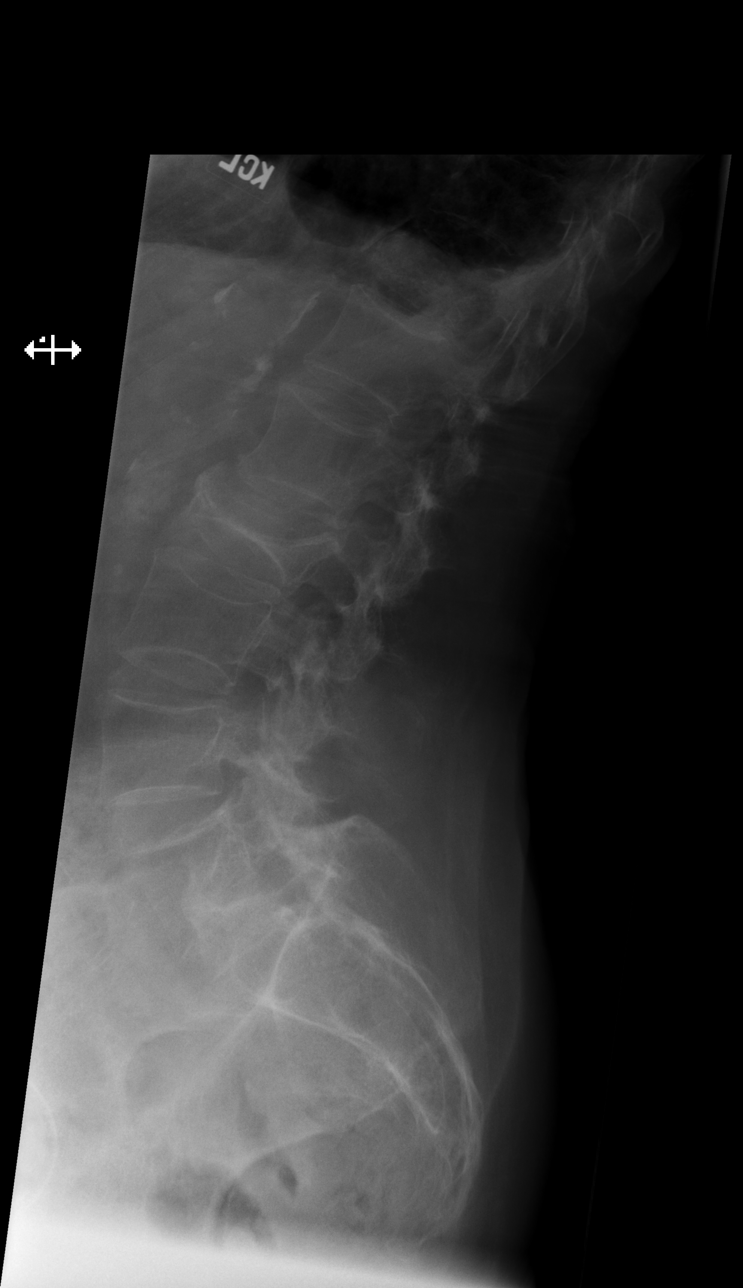

[t lumbar l-5 s-1 spot]
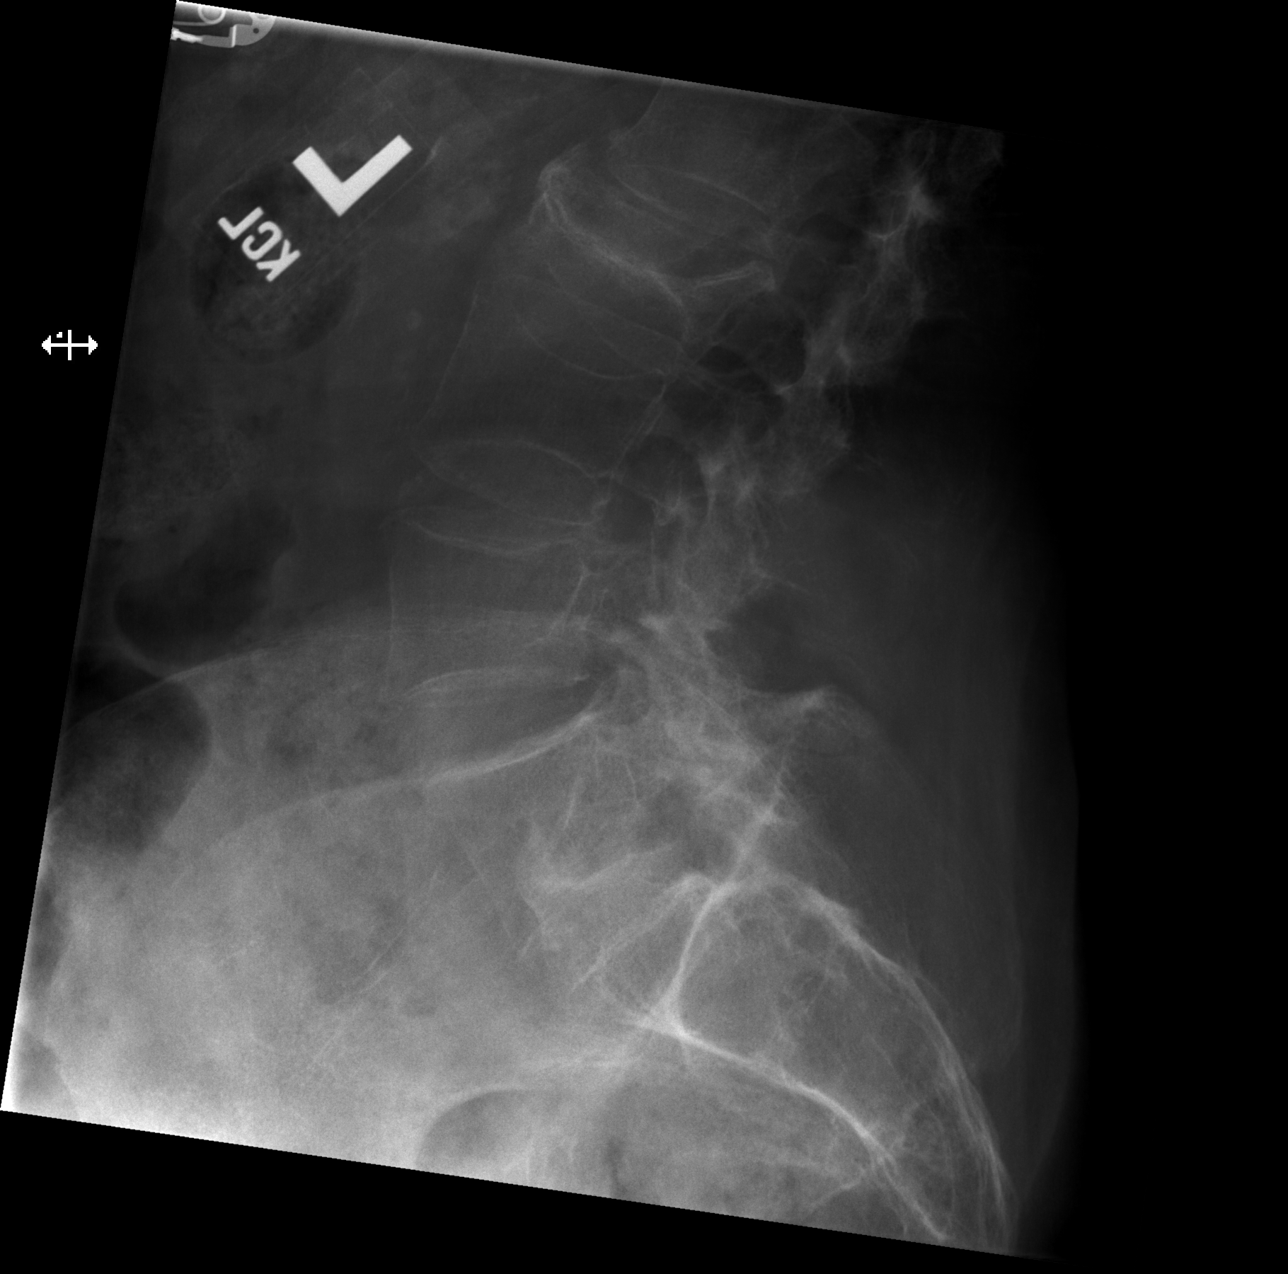

[5 of 5 positions shown; findings below may reference images not displayed]

FINDINGS: There is transitional lumbosacral anatomy, and the transitional
segment will be considered a partially lumbarized S1 for consistency
with the prior study. The bones are diffusely osteopenic. An L3
compression fracture is again seen with similar severe vertebral
body height loss. A mild T12 compression fracture is less well
profiled on today's lateral radiograph than on the prior study but
is without grossly progressive vertebral body height loss. No new
compression fracture is identified. Mild upper lumbar levoscoliosis
is noted. No significant listhesis is seen. Disc space narrowing at
L2-3 is similar to the prior study. Mild lower lumbar facet
arthrosis is noted. There is aortic atherosclerosis.
IMPRESSION: Chronic lumbar and lower thoracic compression fractures without
evidence of acute osseous abnormality.

## 2018-09-18 IMAGING — US US RENAL
1 series · 14 of 25 positions shown · non-contrast
Comparison: Ultrasound of the abdomen 05/29/2017 only examined the
RIGHT upper quadrant.

CLINICAL DATA: Acute kidney injury.

EXAM:
RENAL / URINARY TRACT ULTRASOUND COMPLETE

[Series 1: us renal · 14 of 31 slices shown]
[im 1/31]
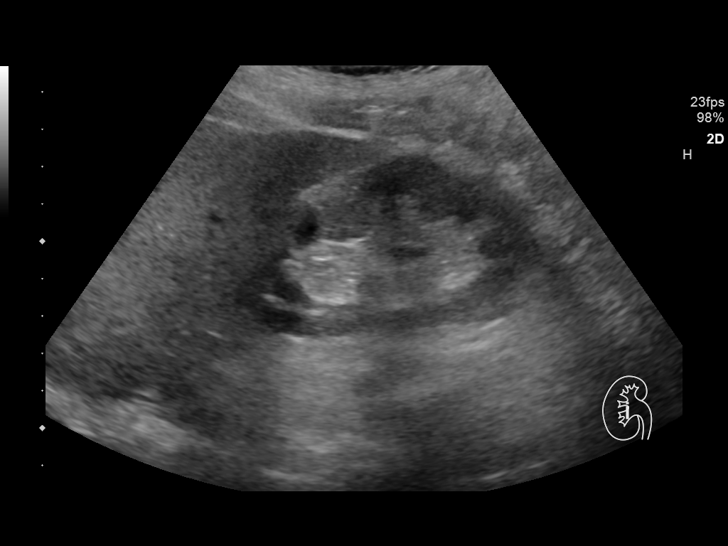
[im 3/31]
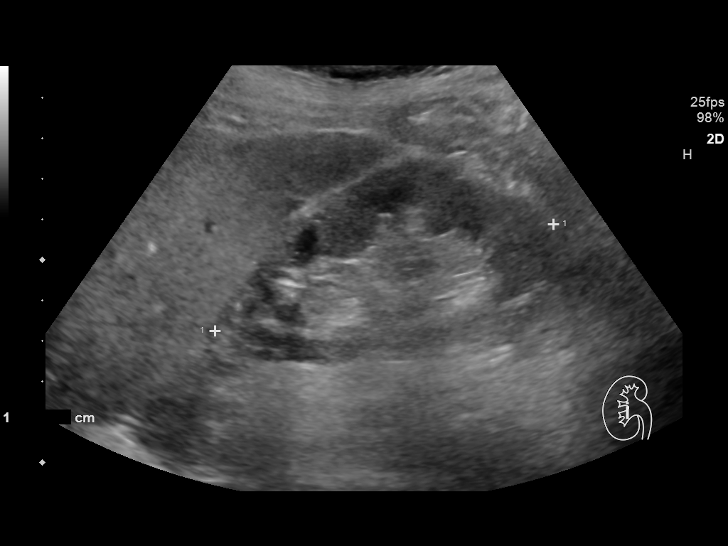
[im 6/31]
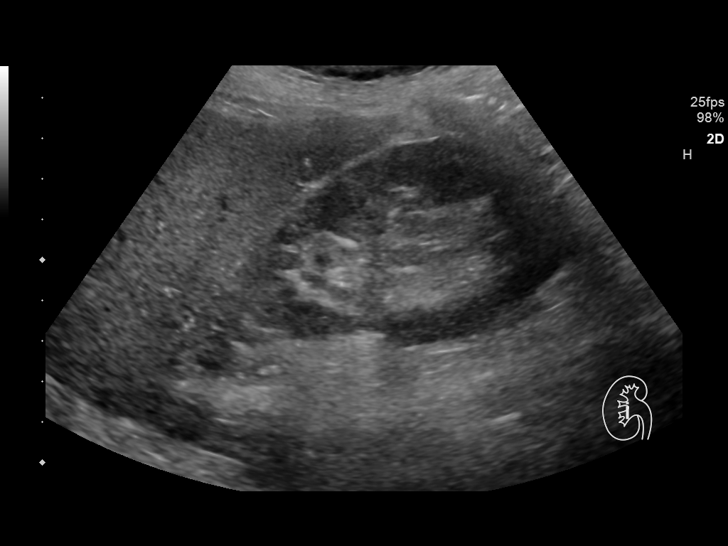
[im 8/31]
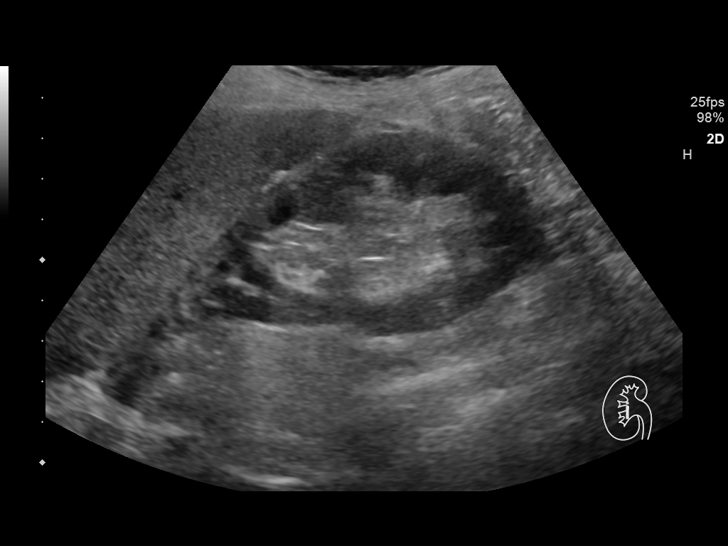
[im 11/31]
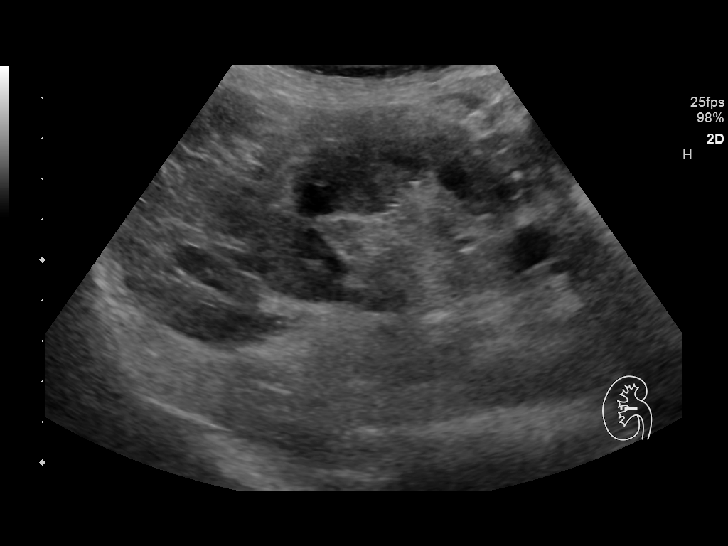
[im 12/31]
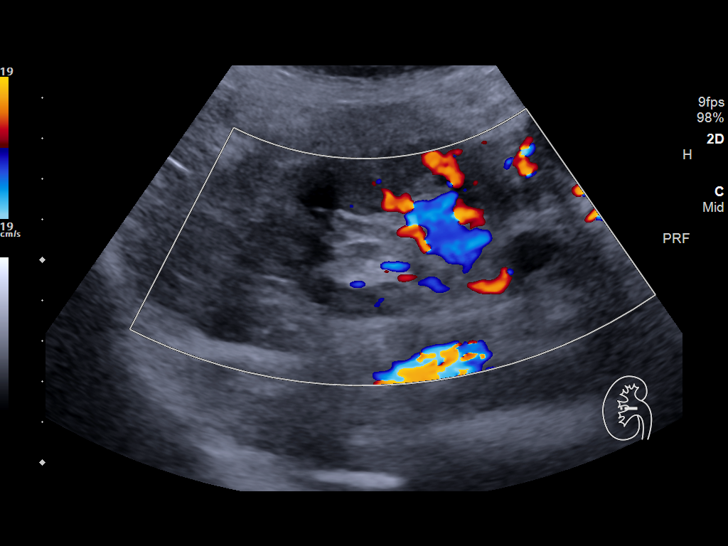
[im 14/31]
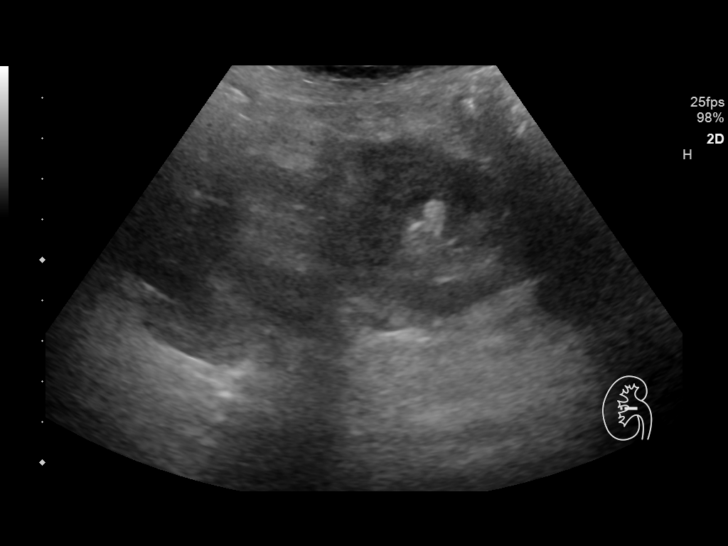
[im 17/31]
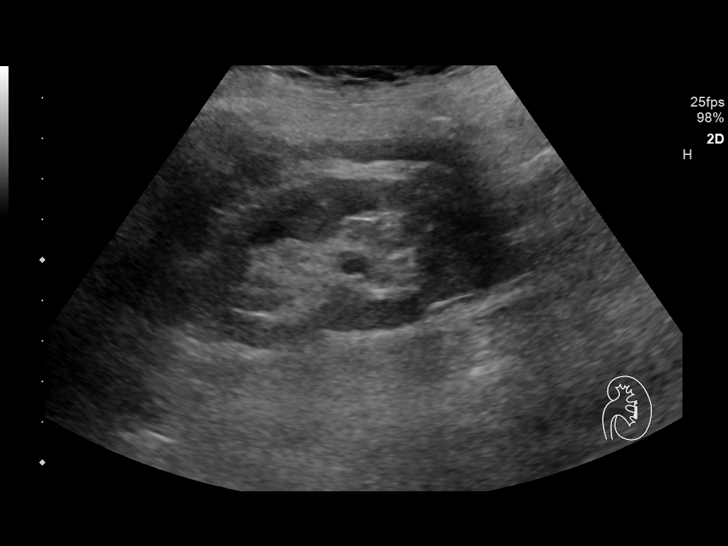
[im 19/31]
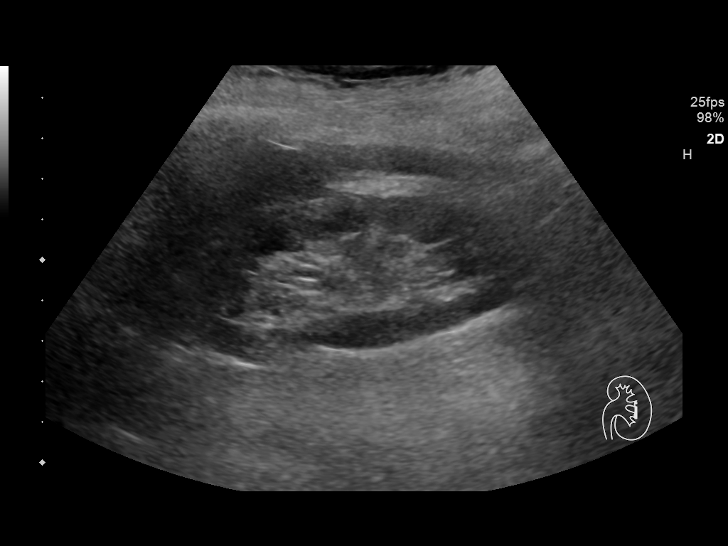
[im 21/31]
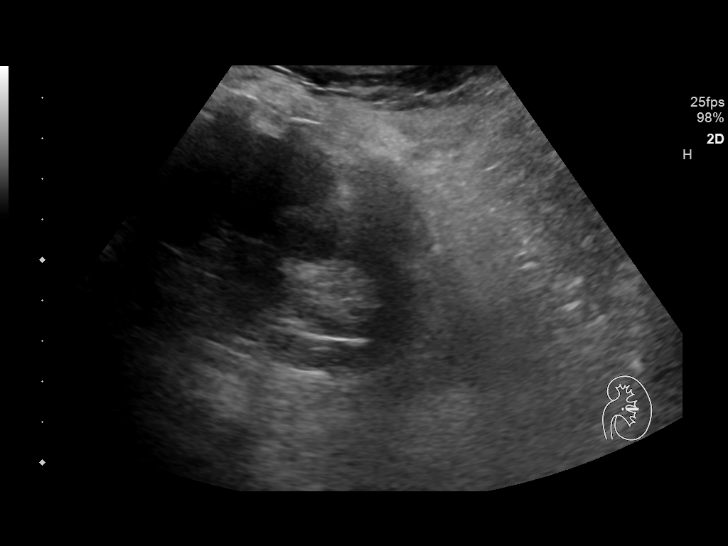
[im 23/31]
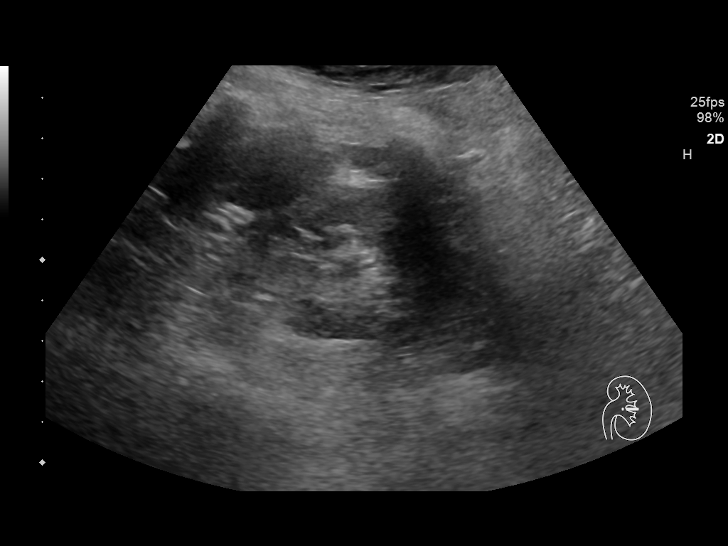
[im 26/31]
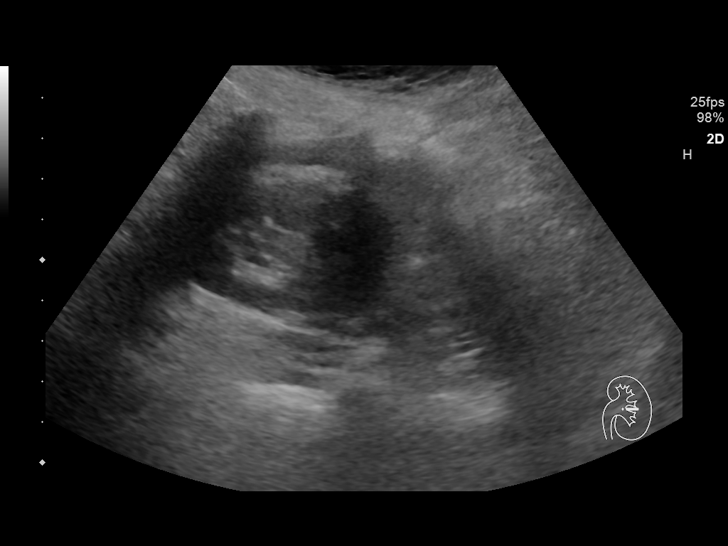
[im 28/31]
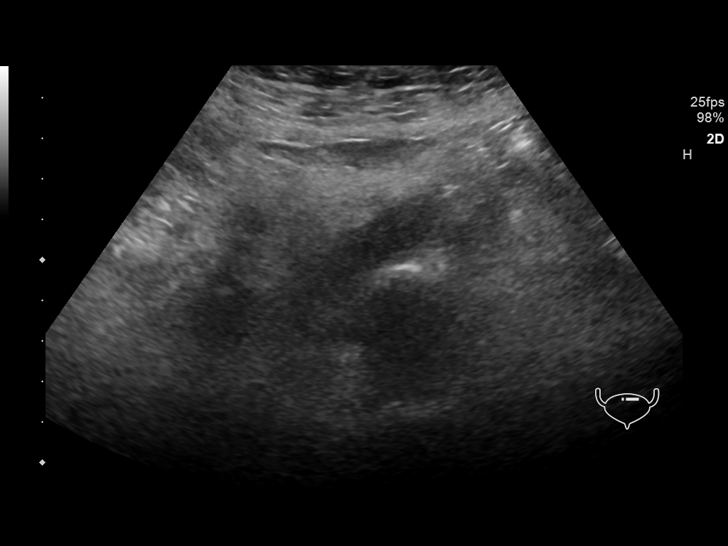
[im 31/31]
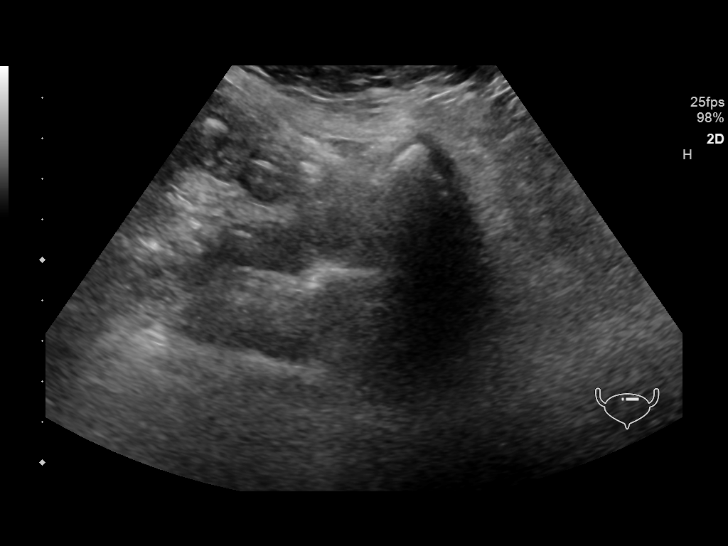

[14 of 25 positions shown; findings below may reference images not displayed]

FINDINGS: Right Kidney:

Length: 8.8 cm. Echogenicity within normal limits. No mass or
hydronephrosis visualized.

Left Kidney:

Length: 8.3 cm. Echogenicity within normal limits. No mass or
hydronephrosis visualized.

Bladder:

Decompressed by Foley catheter.
IMPRESSION: Negative exam.  No hydronephrosis or abnormal echogenicity.

## 2018-09-23 IMAGING — DX DG CHEST 1V PORT
1 series · 1 of 1 positions shown · non-contrast
Comparison: 06/27/2017.

CLINICAL DATA: Atrial fibrillation.  Syncopal episode.

EXAM:
PORTABLE CHEST 1 VIEW

[chest ap]
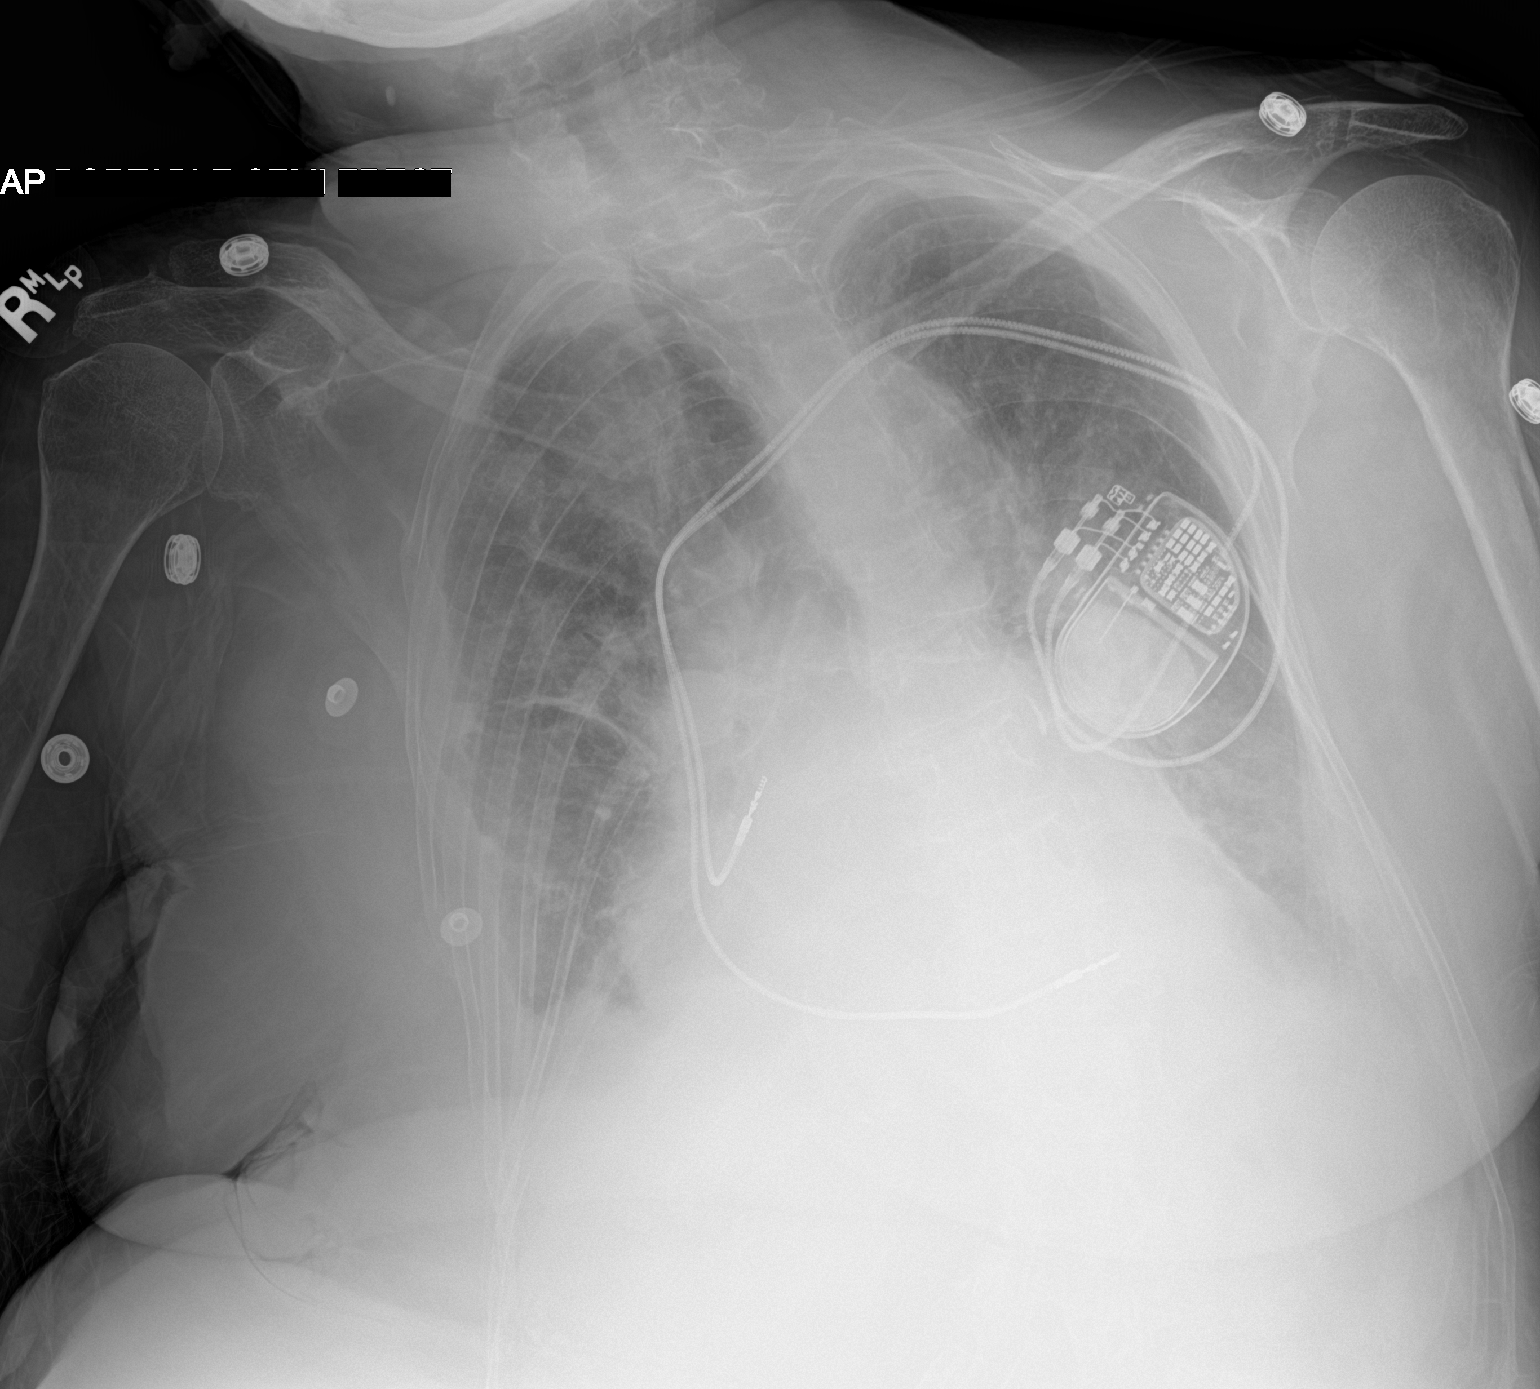

[1 of 1 positions shown; findings below may reference images not displayed]

FINDINGS: Rotated radiograph is suboptimal. Cardiomegaly. Dual lead pacer.
BILATERAL pulmonary opacities, RIGHT greater than LEFT with
effusions, most consistent with pulmonary edema. Worsening aeration
from priors. Osteopenia.
IMPRESSION: Cardiomegaly with suspected pulmonary edema. Worsening aeration from
06/27/2017.
